# Patient Record
Sex: Female | Born: 1935 | Race: White | Hispanic: No | Marital: Married | Smoking: Former smoker
Health system: Southern US, Community
[De-identification: ages and names within clinical notes are randomized; demographics above are authoritative.]

## PROBLEM LIST (undated history)

## (undated) DIAGNOSIS — M1990 Unspecified osteoarthritis, unspecified site: Secondary | ICD-10-CM

## (undated) DIAGNOSIS — H3589 Other specified retinal disorders: Secondary | ICD-10-CM

## (undated) DIAGNOSIS — M545 Low back pain, unspecified: Secondary | ICD-10-CM

## (undated) DIAGNOSIS — J45909 Unspecified asthma, uncomplicated: Secondary | ICD-10-CM

## (undated) DIAGNOSIS — H5310 Unspecified subjective visual disturbances: Secondary | ICD-10-CM

## (undated) DIAGNOSIS — R0600 Dyspnea, unspecified: Secondary | ICD-10-CM

## (undated) DIAGNOSIS — Z8679 Personal history of other diseases of the circulatory system: Secondary | ICD-10-CM

## (undated) DIAGNOSIS — Z9981 Dependence on supplemental oxygen: Secondary | ICD-10-CM

## (undated) DIAGNOSIS — E785 Hyperlipidemia, unspecified: Secondary | ICD-10-CM

## (undated) DIAGNOSIS — Z9289 Personal history of other medical treatment: Secondary | ICD-10-CM

## (undated) DIAGNOSIS — E559 Vitamin D deficiency, unspecified: Secondary | ICD-10-CM

## (undated) DIAGNOSIS — I4891 Unspecified atrial fibrillation: Secondary | ICD-10-CM

## (undated) DIAGNOSIS — G8929 Other chronic pain: Secondary | ICD-10-CM

## (undated) DIAGNOSIS — M8580 Other specified disorders of bone density and structure, unspecified site: Secondary | ICD-10-CM

## (undated) DIAGNOSIS — R0609 Other forms of dyspnea: Secondary | ICD-10-CM

## (undated) DIAGNOSIS — Z8619 Personal history of other infectious and parasitic diseases: Secondary | ICD-10-CM

## (undated) DIAGNOSIS — Z86718 Personal history of other venous thrombosis and embolism: Secondary | ICD-10-CM

## (undated) DIAGNOSIS — Z923 Personal history of irradiation: Secondary | ICD-10-CM

## (undated) HISTORY — PX: GLAUCOMA SURGERY: SHX656

## (undated) HISTORY — DX: Personal history of irradiation: Z92.3

## (undated) HISTORY — DX: Personal history of other diseases of the circulatory system: Z86.79

## (undated) HISTORY — PX: PERINEOPLASTY: SHX2218

## (undated) HISTORY — DX: Dyspnea, unspecified: R06.00

## (undated) HISTORY — DX: Vitamin D deficiency, unspecified: E55.9

## (undated) HISTORY — PX: TONSILLECTOMY: SUR1361

## (undated) HISTORY — DX: Personal history of other venous thrombosis and embolism: Z86.718

## (undated) HISTORY — PX: APPENDECTOMY: SHX54

## (undated) HISTORY — PX: BREAST BIOPSY: SHX20

## (undated) HISTORY — DX: Other forms of dyspnea: R06.09

## (undated) HISTORY — DX: Unspecified atrial fibrillation: I48.91

## (undated) HISTORY — PX: TOTAL ABDOMINAL HYSTERECTOMY: SHX209

## (undated) HISTORY — DX: Unspecified asthma, uncomplicated: J45.909

## (undated) HISTORY — DX: Other specified retinal disorders: H35.89

## (undated) HISTORY — DX: Personal history of other infectious and parasitic diseases: Z86.19

## (undated) HISTORY — DX: Hyperlipidemia, unspecified: E78.5

## (undated) HISTORY — DX: Other specified disorders of bone density and structure, unspecified site: M85.80

## (undated) HISTORY — DX: Unspecified subjective visual disturbances: H53.10

## (undated) HISTORY — PX: SHOULDER ARTHROSCOPY W/ ROTATOR CUFF REPAIR: SHX2400

---

## 1992-11-26 DIAGNOSIS — Z86718 Personal history of other venous thrombosis and embolism: Secondary | ICD-10-CM

## 1992-11-26 HISTORY — DX: Personal history of other venous thrombosis and embolism: Z86.718

## 2000-08-03 ENCOUNTER — Encounter: Payer: Self-pay | Admitting: Orthopedic Surgery

## 2000-08-03 ENCOUNTER — Encounter: Admission: RE | Admit: 2000-08-03 | Discharge: 2000-08-03 | Payer: Self-pay | Admitting: Orthopedic Surgery

## 2001-07-16 ENCOUNTER — Ambulatory Visit (HOSPITAL_COMMUNITY): Admission: RE | Admit: 2001-07-16 | Discharge: 2001-07-16 | Payer: Self-pay | Admitting: Internal Medicine

## 2001-07-16 LAB — PULMONARY FUNCTION TEST

## 2001-12-20 ENCOUNTER — Other Ambulatory Visit: Admission: RE | Admit: 2001-12-20 | Discharge: 2001-12-20 | Payer: Self-pay | Admitting: *Deleted

## 2004-04-28 ENCOUNTER — Encounter: Admission: RE | Admit: 2004-04-28 | Discharge: 2004-04-28 | Payer: Self-pay | Admitting: Internal Medicine

## 2004-08-28 HISTORY — PX: CARPAL TUNNEL RELEASE: SHX101

## 2007-03-28 ENCOUNTER — Ambulatory Visit: Payer: Self-pay | Admitting: Internal Medicine

## 2007-05-14 ENCOUNTER — Ambulatory Visit: Payer: Self-pay | Admitting: Internal Medicine

## 2007-11-18 ENCOUNTER — Ambulatory Visit: Payer: Self-pay | Admitting: Internal Medicine

## 2008-05-19 ENCOUNTER — Ambulatory Visit: Payer: Self-pay | Admitting: Internal Medicine

## 2008-05-19 DIAGNOSIS — H3589 Other specified retinal disorders: Secondary | ICD-10-CM

## 2008-05-19 DIAGNOSIS — I480 Paroxysmal atrial fibrillation: Secondary | ICD-10-CM

## 2008-05-19 DIAGNOSIS — J45998 Other asthma: Secondary | ICD-10-CM | POA: Insufficient documentation

## 2008-05-19 DIAGNOSIS — J301 Allergic rhinitis due to pollen: Secondary | ICD-10-CM

## 2008-05-19 DIAGNOSIS — E785 Hyperlipidemia, unspecified: Secondary | ICD-10-CM

## 2008-05-19 DIAGNOSIS — Z86718 Personal history of other venous thrombosis and embolism: Secondary | ICD-10-CM | POA: Insufficient documentation

## 2008-05-19 DIAGNOSIS — H5310 Unspecified subjective visual disturbances: Secondary | ICD-10-CM | POA: Insufficient documentation

## 2009-05-19 ENCOUNTER — Ambulatory Visit: Payer: Self-pay | Admitting: Internal Medicine

## 2009-09-28 HISTORY — PX: TRANSTHORACIC ECHOCARDIOGRAM: SHX275

## 2010-04-22 ENCOUNTER — Ambulatory Visit: Payer: Self-pay | Admitting: Internal Medicine

## 2010-05-03 ENCOUNTER — Telehealth (INDEPENDENT_AMBULATORY_CARE_PROVIDER_SITE_OTHER): Payer: Self-pay | Admitting: *Deleted

## 2010-05-23 ENCOUNTER — Telehealth: Payer: Self-pay | Admitting: Internal Medicine

## 2010-09-27 NOTE — Progress Notes (Signed)
Summary: nos appt  Phone Note Call from Patient   Caller: [email or web address]  Call For: Nechelle Petrizzo Summary of Call: Pt's husband stated nos appt from 9/22 wasn' t needed. Initial call taken by: Darletta Moll,  May 23, 2010 10:57 AM

## 2010-09-27 NOTE — Progress Notes (Signed)
Summary: ok for symbicort 160-4.31mcg  Phone Note Call from Patient Call back at Home Phone [phone] Call back at [phone]   Caller: Spouse-Dr. Zachery Dakins Call For: young Reason for Call: Talk to Nurse Summary of Call: Symbicort working well - [facility] Initial call taken by: Eugene Gavia,  May 03, 2010 9:30 AM  Follow-up for Phone Call        called and spoke with pt's husband. husband states pt was seen recently by CY on 8/26/011 and was told to resume taking Symbicort.  Husband states breathing has improved on Symbicort and would like rx called into pharmacy.  Also, husband wanted to know how long pt would need to stay on the 2 puff two times a day directions before cutting down to either 1 puff two times a day or 2 puffs once daily.  Will forward message to CY to address. Arman Filter LPN  May 03, 2010 9:55 AM   Additional Follow-up for Phone Call Additional follow up Details #1::        Per CDY-okay to refill Symbicort 160/4.5 #1 2 puffs and Rinse two times a day with as needed refills; if stable and comfortable can try reducing-minimun would be 1 puff once daily.Reynaldo Minium CMA  May 03, 2010 10:41 AM   called spoke with Dr. Zachery Dakins.  informed of CDYs recs as stated above.  Dr. Zachery Dakins verbalized his understanding.  rx sent to verified pharmacy. Additional Follow-up by: Boone Master CNA/MA,  May 03, 2010 12:15 PM    New/Updated Medications: SYMBICORT 160-4.5 MCG/ACT AERO (BUDESONIDE-FORMOTEROL FUMARATE) 2 puffs and Rinse two times a day Prescriptions: SYMBICORT 160-4.5 MCG/ACT AERO (BUDESONIDE-FORMOTEROL FUMARATE) 2 puffs and Rinse two times a day  #1 x PRN   Entered by:   Boone Master CNA/MA   Authorized by:   Waymon Budge MD   Signed by:   Boone Master CNA/MA on 05/03/2010   Method used:   Electronically to        [facility]. [phone]* (retail)       [address]       [place], [place]  [address]       Ph: [phone]       Fax: [phone]   RxID:   [id number]

## 2010-09-27 NOTE — Assessment & Plan Note (Signed)
Summary: f/u asthma/ ///kp   Primary Provider/Referring Provider:  Perini  CC:  follow up visit-asthma..  History of Present Illness: History of Present Illness: this 77 year old wife of Dr. Valentino Burns, who comes with her today as she follows up for allergic rhinitis and asthma complicated by history of atrial fibrillation and pulmonary embolism.   05/19/08: She says she is "fine", aware of postnasal drip, which is nonseasonal.  Husband says she does some sniffing and throat clearing.  She continues Nasacort HQ one puff each nostril daily.asks about cost saving, alternatives to symbicort.  Medication talk done.  She had PFTs and methacholine study 3 or 4 years ago.  I will get that result for EMR..  05/19/09- Allergic rhinitis, asthma, AF, Hx PE, legally blind...............................Marland Kitchenhusband here She has some persistent nasal drip and we discussed available meds. Discussed using Symbicort 160, used once daily. it isn't clear she needs it. We discussed whether to do another PFT. I reviewed previous PFT with them. Discussed meds.  April 22, 2010- Allergic rhinitis, asthma, AF, Hx PE, legally blind....Marland KitchenMarland KitchenDr Jodi Burns here with her She had been doing pretty well, off Symbicort and using rescue inhaler once a week. In the last week or two has been more easily dyspneic walking a few hundred feet, relieved by brief rests. Pre-use of her inhaler helps. No overt wheeze, cough, chest pain or palpitation.  Office Spirometry- Severe obstructive disease- FEV1 0.76/ 45%; FEV1/FVC 0.55. Walking oximetry- No desaturation on room air walking 185 feet x 3 laps. Walks with husband are longer.      Asthma History    Initial Asthma Severity Rating:    Age range: 12+ years    Symptoms: >2 days/week; not daily    Nighttime Awakenings: 0-2/month    Interferes w/ normal activity: no limitations    SABA use (not for EIB): daily    Asthma Severity Assessment: Moderate Persistent   Preventive  Screening-Counseling & Management  Alcohol-Tobacco     Smoking Status: quit     Year Quit: 1960     Pack years: 17 years   1/2 pack daily     Tobacco Counseling: to remain off tobacco products  Current Medications (verified): 1)  Proair Hfa 108 (90 Base) Mcg/act Aers (Albuterol Sulfate) .... 2 Puffs Four Times A Day As Needed , Rescue 2)  Lanoxin 0.125 Mg  Tabs (Digoxin) .... Take 1 Tablet By Mouth Once A Day 3)  Ocuvite   Tabs (Multiple Vitamins-Minerals) .... Take One Tablet By Mouth Two Times A Day 4)  Zocor 40 Mg Tabs (Simvastatin) .... Take 1 By Mouth Once Daily 5)  Cosopt 2-0.5 %  Soln (Dorzolamide-Timolol) .... Use One Drop in Each Eye Two Times A Day 6)  Fish Oil 1000 Mg  Caps (Omega-3 Fatty Acids) .... Use One Capsule By Mouth Two Times A Day 7)  Cvs Vitamin B-6 100 Mg  Tabs (Pyridoxine Hcl) .... Take 1 Tablet By Mouth Once A Day 8)  Vitamin D (Ergocalciferol) 50000 Unit Caps (Ergocalciferol) .... Take 1 By Mouth Weekly 9)  Astepro 0.15 % Soln (Azelastine Hcl) .Marland Kitchen.. 1-2 Ouffs Each Nostril Up To Twice Daily As Needed  Allergies (verified): 1)  ! Ceclor  Past History:  Past Medical History: Last updated: 05/19/2008 ALLERGIC RHINITIS (ICD-477.9) ASTHMA (ICD-493.90) ATRIAL FIBRILLATION, HX OF (ICD-V12.59) PULMONARY EMBOLISM, HX OF (ICD-V12.51) HYPERLIPIDEMIA (ICD-272.4) VISUAL IMPAIRMENT (ICD-368.10) RETINAL DISORDER (ICD-362.89)  Past Surgical History: Last updated: 05/19/2008 hysterectomy breast biopsy  Family History: Last updated: 06/30/2008 mother was healthy  father died at 54 from sepsis  Social History: Last updated: 06/30/2008 Patient states former smoker.  Husband is retired pediatrician patient is nearly blind drinks 1 cup caffeine a day married 2 children  Risk Factors: Smoking Status: quit (04/22/2010)  Review of Systems      See HPI       The patient complains of shortness of breath with activity.  The patient denies shortness of breath at  rest, productive cough, non-productive cough, coughing up blood, chest pain, irregular heartbeats, acid heartburn, indigestion, loss of appetite, weight change, abdominal pain, difficulty swallowing, sore throat, tooth/dental problems, and headaches.         occasional rhinorhea  Vital Signs:  Patient profile:   77 year old female Height:      62 inches Weight:      131.25 pounds BMI:     24.09 O2 Sat:      96 % on Room air Pulse rate:   92 / minute BP sitting:   118 / 66  (left arm) Cuff size:   regular  Vitals Entered By: Jodi Burns CMA (April 22, 2010 3:20 PM)  O2 Flow:  Room air  Serial Vital Signs/Assessments:  Comments: 4:02 PM Ambulatory Pulse Oximetry  Resting; HR__83___    02 Sat__96%ra___  Lap1 (185 feet)   HR__106___   02 Sat__95%ra___ Lap2 (185 feet)   HR__110___   02 Sat__95%ra___    Lap3 (185 feet)   HR__109___   02 Sat__96%ra___  _x__Test Completed without Difficulty ___Test Stopped due to:   By: Jodi Burns   CC: follow up visit-asthma.   Physical Exam  Additional Exam:  General: A/Ox3; pleasant and cooperative, NAD, Nearly blind SKIN: no rash, lesions NODES: no lymphadenopathy HEENT: [place]/AT, EOM- WNL, Conjuctivae- clear, PERRLA, TM-WNL, Burns- clear, sniffing some, Throat- clear and wnl, Mallampati  II NECK: Supple w/ fair ROM, JVD- none, normal carotid impulses w/o bruits Thyroid-  CHEST: Clear to P&A, unlabored with no wheeze HEART: RRR, no m/g/r heard ABDOMEN: Soft and nl; ZOX:WRUE, nl pulses, no edema  NEURO: Grossly intact to observation      Pulmonary Function Test Date: 04/22/2010 3:49 PM Gender: Female  Pre-Spirometry FVC    Value: 1.39 L/min   % Pred: 60.40 % FEV1    Value: 0.76 L     Pred: 1.70 L     % Pred: 44.70 % FEV1/FVC  Value: 54.67 %     % Pred: 74.30 %  Impression & Recommendations:  Problem # 1:  ASTHMA (ICD-493.90) Dyspnea with exertion in last 1-2 weeks is reflection of air quality rather than a  physiologic change. PFT does show fixed obstruction c/w copd after smoking x 10-15 years, stopping when Jodi Burns died.  Oxygenation isn't limiting her, but obstructive akirways disease is, with FEV1 45% predicted.  I don't think she has had a new VTE event, but will watch for this. We will have her retry Symbicort with a sample.  Problem # 2:  ATRIAL FIBRILLATION, HX OF (ICD-V12.59) Rhythm feels regular or very nearly so today, with controlled ventricular response.  Problem # 3:  PULMONARY EMBOLISM, HX OF (ICD-V12.51) No new event. Consider if we need to go looking for chronic emboic disease or pulmonary hypertension later.  Medications Added to Medication List This Visit: 1)  Zocor 40 Mg Tabs (Simvastatin) .... Take 1 by mouth once daily 2)  Vitamin D (ergocalciferol) 50000 Unit Caps (Ergocalciferol) .... Take 1 by mouth weekly  Other Orders: Est. Patient Level  III [phone])  Patient Instructions: 1)  Walikng oximetry on room air 2)  Spirometry 3)  Continue regular walking as tolerated. 4)  Try resuming Symbicort  160 / 4.5;  2 puffs and rinse mouth, twice daily. OK to use with a spacer. 5)  As you use it up, notice if it has made a difference.  6)  Please schedule a follow-up appointment in 1 year.      [facility] Spirometry  ID: [id number] Patient: Jodi Burns, Jodi Burns DOB: [birth year] Age: 77 Years Old Sex: Female Race: White Physician: Young,Clinton Height: 62 Weight: 131.25 Smoker: No Status: Unconfirmed Past Medical History:  ALLERGIC RHINITIS (ICD-477.9) ASTHMA (ICD-493.90) ATRIAL FIBRILLATION, HX OF (ICD-V12.59) PULMONARY EMBOLISM, HX OF (ICD-V12.51) HYPERLIPIDEMIA (ICD-272.4) VISUAL IMPAIRMENT (ICD-368.10) RETINAL DISORDER (ICD-362.89)   Recorded: 04/22/2010 3:49 PM  Parameter  Measured Predicted %Predicted FVC     1.39        2.30        60.40 FEV1     0.76        1.70        44.70 FEV1%   54.67        73.60        74.30 PEF    1.81        4.37         41.60   Interpretation:

## 2011-01-10 NOTE — Assessment & Plan Note (Signed)
[facility]                             PULMONARY OFFICE NOTE   NILA, WINKER                   MRN:          [id number]  DATE:05/14/2007                            DOB:          Mar 09, 1926    PROBLEM LIST:  1. Mild intermittent asthma.  2. Rhinitis.   HISTORY:  She and her husband report that her breathing has been doing  very much better.  She walks without puffing on a regular basis.  She  started out with Symbicort 160/4.5 two puffs b.i.d. and has dropped it  to 1 puff b.i.d.  She has also reduced her Nasonex.  She cannot tell  that she needs either of them currently.  We scheduled pulmonary  function tests at her first visit, but that was cancelled because of  cataract surgery.   MEDICATIONS:  1. Symbicort.  2. Nasonex.  3. Lanoxin.  4. Vitamin D.  5. Vytorin 10/40.  6. Glaucoma drops.  7. B6.   Drug intolerance to CECLOR with gastrointestinal upset.   OBJECTIVE:  Weight 102 pounds, BP 150/74, pulse 97, room air saturation  98%.  She looks very comfortable.  Voice quality is clear and she laughs  easily.  Nasal airway is clear.  Chest is clear to P&A.  Heart sounds regular without murmur or gallop.  No edema.   Chest x-ray on July 31 reported clear lung fields, normal heart size, no  active lung disease.   IMPRESSION:  1. Mild intermittent asthma.  2. Mild rhinitis.   Problems are currently inactive.  She asks permission to try getting off  of medication.   PLAN:  She may drop off Symbicort 160/4.5 and Nasonex as tolerated.  Consider p.r.n. Claritin.  She will start her inhaled medicines again  should acute need arise.  Will schedule once more for followup in 1 year  to keep chart open for earlier access p.r.n.     Clinton D. Maple Hudson, MD, Tonny Bollman, FACP  Electronically Signed    CDY/MedQ  DD: 05/14/2007  DT: 05/14/2007  Job #: [id number]   cc:   Loraine Leriche A. Perini, M.D.

## 2011-01-10 NOTE — Assessment & Plan Note (Signed)
[facility]                             PULMONARY OFFICE NOTE   TYLAR, MERENDINO                   MRN:          [id number]  DATE:03/28/2007                            DOB:          [birth year]    A 77 year old woman referred through the courtesy of Dr. Waynard Edwards in  pulmonary consultation concerned about reactive airways disease.   HISTORY:  She comes with her husband, Baldo Ash, a retired Optometrist.  Together they describe wheezing noted occasionally over the last year or  two, especially in the winter.  She has not had a prior diagnosis of  respiratory disease that she recalls, and has never thought of herself  as an allergy person.  She tried Flovent originally, but Dr. Waynard Edwards has  helped a lot by giving her Symbicort, which, she says, has made a great  difference.  Her primary trigger seems to have been exercise, mainly  walking, but she also has had exacerbations with what sounds like winter  colds.  She can walk 35-40 minutes without stopping.  She had had a  pulmonary function test at [facility] about 2 years ago, and I note that it  raises some question about her sense that this is not more than a year  or two as a problem for her.  She does not expect sputum, chest pain or  any sudden event.  Today she seems to feel quite well.   MEDICATIONS:  1. Symbicort 160/45 two puffs b.i.d.  2. Nasonex 2 sprays each nostril.  3. Lanoxin 125 mcg.  4. Vitamin D.  5. Vytorin 10/40.  6. Cosopt 1 drop b.i.d.  7. Xalatan 1 drop.  8. Omega-3.  9. Vitamin B6.   Drug intolerance to CECLOR with GI upset.   REVIEW OF SYSTEMS:  Shortness of breath with exertion, occasional mild  acid indigestion, which, she says, does wake her.  Occasional joint  stiffness.  She has some postnasal drainage.  No awareness of any rasp,  aspiration, or choking.  No ankle edema, no exertional chest pain or  palpation.  Occasional right parasternal pain is relieved promptly by  changing positions, suggesting that it is musculoskeletal.   PAST HISTORY:  1. One episode of atrial fibrillation in the past.  2. Asthma.  3. Elevated cholesterol.  4. Pulmonary embolism with Coumadin, remote.  5. Retinal disease with significant visual impairment.  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  6. She is not sure if she had pneumonia.  7. Surgeries for hysterectomy and breast biopsy.   No intolerance to latex, contrast or aspirin.   SOCIAL HISTORY:  She smoked a half a pack a day for 15 or 16 years,  quitting 17 years ago.  Occasional alcohol.  She is a retired Engineer, civil (consulting).   FAMILY HISTORY:  Nobody with respiratory problems.  Father had cancer.   ENVIRONMENTAL:  They have no pets, several large trees around  the home.  They do not recognize respiratory triggers in the home.   OBJECTIVE:  Weight 133 pounds, BP 128/76, pulse 84, room air saturation  97%.  This is a well-developed, well-nourished pleasant lady in no distress.  HEENT:  Oropharynx is clear.  Slight hoarseness to voice quality without  stridor, visible drainage, erythema, neck vein distension or adenopathy.  Heart sounds are regular without murmur.  Her chest is quiet and clear, breathing is unlabored.  EXTREMITIES:  Without cyanosis, clubbing, or edema.   IMPRESSION:  1. Mild, intermittent asthma or intermittent bronchitis.  2. Mild rhinitis.  Symptoms are currently doing quite well on      Symbicort and Nasonex.   PLAN:  We agreed to try to clarify documentation by getting pulmonary  function tests and chest x-ray.  To explore her current medication  needs, she is going to try reducing Symbicort to 1 puff b.i.d. and  Nasonex to 1 puff each nostril.  Pending return in 1 month for followup.  Looking at stability through weather change.  I appreciate the chance to  meet  her.     Clinton D. Maple Hudson, MD, Tonny Bollman, FACP  Electronically Signed    CDY/MedQ  DD: 03/28/2007  DT: 03/29/2007  Job #: [id number]   cc:   Loraine Leriche A. Perini, M.D.

## 2011-04-21 ENCOUNTER — Ambulatory Visit: Payer: Self-pay | Admitting: Internal Medicine

## 2011-04-25 ENCOUNTER — Ambulatory Visit (INDEPENDENT_AMBULATORY_CARE_PROVIDER_SITE_OTHER): Payer: Medicare Other | Admitting: Internal Medicine

## 2011-04-25 ENCOUNTER — Encounter: Payer: Self-pay | Admitting: Internal Medicine

## 2011-04-25 VITALS — BP 122/74 | HR 96 | Ht 62.0 in | Wt 128.2 lb

## 2011-04-25 DIAGNOSIS — J45909 Unspecified asthma, uncomplicated: Secondary | ICD-10-CM

## 2011-04-25 DIAGNOSIS — Z23 Encounter for immunization: Secondary | ICD-10-CM

## 2011-04-25 DIAGNOSIS — J309 Allergic rhinitis, unspecified: Secondary | ICD-10-CM

## 2011-04-25 DIAGNOSIS — H5310 Unspecified subjective visual disturbances: Secondary | ICD-10-CM

## 2011-04-25 NOTE — Assessment & Plan Note (Signed)
She used to smoke. At last PFT there was a significant response to bronchodilator. Not clear how much of her obstructive lung disease is best considered asthma vs COPD

## 2011-04-25 NOTE — Progress Notes (Signed)
Subjective:    Patient ID: Jodi Burns, female    DOB: 08-Aug-1926, 77 y.o.   MRN: [id number]  HPI 04/25/11- 35 yoF former smoker followed for asthma complicated by hx PE, Hx AFib, legally blind( Histo retinitis)     Husband here (Dr. Zachery Dakins) Last here April 22, 2010 She fractured her right tibia falling this Spring, delaying intended cardiac stress test pending with [facility] Bryan Lemma, MD. No significant respiratory issues. She rarely uses her rescue inhaler. We discussed reducing her Symbicort to 1 puff twice daily.  Husband asks about assessing her pulmonary status. Denies cough or phlegm, but admits some nasal drip.  We discussed her eyesight/ glaucoma and avoidance of anticholinergics to dry nasal drip.  Review of Systems Constitutional:   No-   weight loss, night sweats, fevers, chills, fatigue, lassitude. HEENT:   No-  headaches, difficulty swallowing, tooth/dental problems, sore throat,       No-  sneezing, itching, ear ache,    +nasal congestion, post nasal drip,  CV:  No-   chest pain, orthopnea, PND, swelling in lower extremities, anasarca, dizziness, palpitations Resp: No-   shortness of breath with exertion or at rest, but she is not very active.Marland Kitchen              No-   productive cough,  No non-productive cough,  No-  coughing up of blood.              No-   change in color of mucus.  No- wheezing.   Skin: No-   rash or lesions. GI:  No-   heartburn, indigestion, abdominal pain, nausea, vomiting, diarrhea,                 change in bowel habits, loss of appetite GU: No-   dysuria, change in color of urine, no urgency or frequency.  No- flank pain. MS:  No-   joint pain or swelling.  No- decreased range of motion.  No- back pain. Neuro- grossly normal to observation, Or:  Psych:  No- change in mood or affect. No depression or anxiety.  No memory loss.     Objective:   Physical Exam General- Alert, Oriented, Affect-appropriate, Distress- none acute   Slender cheerful Skin- rash-none, lesions- none, excoriation- none Lymphadenopathy- none Head- atraumatic            Eyes- Gross vision -limited to light and dark,  conjunctivae clear secretions            Ears- Hearing, canals normal           Nose- Clear, No-Septal dev, mucus, polyps, erosion, perforation             Throat- Mallampati II , mucosa clear , drainage- none, tonsils- atrophic Neck- flexible , trachea midline, no stridor , thyroid nl, carotid no bruit Chest - symmetrical excursion , unlabored           Heart/CV- RRR , no murmur , no gallop  , no rub, nl s1 s2                           - JVD- none , edema- none, stasis changes- none, varices- none           Lung- clear to P&A, wheeze- none, cough- none , dullness-none, rub- none           Chest wall-  Abd- tender-no, distended-no, bowel sounds-present, HSM- no Br/  Gen/ Rectal- Not done, not indicated Extrem- cyanosis- none, clubbing, none, atrophy- none, strength- nl Neuro- grossly intact to observation         Assessment & Plan:

## 2011-04-25 NOTE — Patient Instructions (Addendum)
  Flu vaccine  Order- schedule PFT- dx asthma  Ok to reduce Symbicort to 1 puff and rinse, twice daily   Consider trying otc anti-inflammatory nasal spray Nasalcrom/ cromolyn

## 2011-04-25 NOTE — Assessment & Plan Note (Addendum)
They found no benefit from Astepro, nasal steroids or antihistamine pills. We will avoid ipratropium because of glaucoma There would be no harm in trying Nasalcrom

## 2011-04-29 ENCOUNTER — Encounter: Payer: Self-pay | Admitting: Internal Medicine

## 2011-04-29 NOTE — Assessment & Plan Note (Signed)
Secondary to histoplasmosis

## 2011-05-12 ENCOUNTER — Ambulatory Visit (INDEPENDENT_AMBULATORY_CARE_PROVIDER_SITE_OTHER): Payer: Medicare Other | Admitting: Internal Medicine

## 2011-05-12 DIAGNOSIS — J45909 Unspecified asthma, uncomplicated: Secondary | ICD-10-CM

## 2011-05-12 LAB — PULMONARY FUNCTION TEST

## 2011-05-12 NOTE — Progress Notes (Signed)
PFT done today. 

## 2011-05-17 ENCOUNTER — Other Ambulatory Visit: Payer: Self-pay | Admitting: Internal Medicine

## 2011-06-20 ENCOUNTER — Encounter: Payer: Self-pay | Admitting: Internal Medicine

## 2011-06-20 ENCOUNTER — Ambulatory Visit (INDEPENDENT_AMBULATORY_CARE_PROVIDER_SITE_OTHER): Payer: Medicare Other | Admitting: Internal Medicine

## 2011-06-20 VITALS — BP 150/88 | HR 93 | Ht 62.0 in | Wt 129.2 lb

## 2011-06-20 DIAGNOSIS — J45909 Unspecified asthma, uncomplicated: Secondary | ICD-10-CM

## 2011-06-20 DIAGNOSIS — J309 Allergic rhinitis, unspecified: Secondary | ICD-10-CM

## 2011-06-20 NOTE — Patient Instructions (Signed)
Ok to try reducing Symbicort to 1 puff twice daily. You can go back up to 2 puffs twice daily if needed.  The walking is good- keep it up.

## 2011-06-20 NOTE — Progress Notes (Signed)
Patient ID: Jodi Burns, female    DOB: 17-Apr-1926, 77 y.o.   MRN: [id number]  HPI 04/25/11- 36 yoF former smoker followed for asthma complicated by hx PE, Hx AFib, legally blind( Histo retinitis)     Husband here (Dr. Zachery Dakins) Last here April 22, 2010 She fractured her right tibia falling this Spring, delaying intended cardiac stress test pending with [facility] Bryan Lemma, MD. No significant respiratory issues. She rarely uses her rescue inhaler. We discussed reducing her Symbicort to 1 puff twice daily.  Husband asks about assessing her pulmonary status. Denies cough or phlegm, but admits some nasal drip.  We discussed her eyesight/ glaucoma and avoidance of anticholinergics to dry nasal drip.   06/20/11- 3 yoF former smoker followed for asthma complicated by hx PE, Hx AFib, legally blind( Histo retinitis)     Husband here (Dr. Zachery Dakins)  Occasional clear nasal discharge never tried Nasalcrom and says the amount of drainage is not bothering her. She feels "fine", better than in a long time. Denies wheeze or shortness of breath. Continues Symbicort. Goes for walks with her husband.   Review of Systems Constitutional:   No-   weight loss, night sweats, fevers, chills, fatigue, lassitude. HEENT:   No-  headaches, difficulty swallowing, tooth/dental problems, sore throat,       No-  sneezing, itching, ear ache,    +nasal congestion, post nasal drip,  CV:  No-   chest pain, orthopnea, PND, swelling in lower extremities, anasarca, dizziness, palpitations Resp: No-   shortness of breath with exertion or at rest, but she is not very active.Marland Kitchen              No-   productive cough,  No non-productive cough,  No-  coughing up of blood.              No-   change in color of mucus.  No- wheezing.   Skin: No-   rash or lesions. GI:  No-   heartburn, indigestion, abdominal pain, nausea, vomiting, diarrhea,                 change in bowel habits, loss of appetite GU: No-    dysuria, change in color of urine, no urgency or frequency.  No- flank pain. MS:  No-   joint pain or swelling.  No- decreased range of motion.  No- back pain. Neuro- grossly normal to observation, Or:  Psych:  No- change in mood or affect. No depression or anxiety.  No memory loss.     Objective:   Physical Exam General- Alert, Oriented, Affect-appropriate, Distress- none acute  Slender cheerful Skin- rash-none, lesions- none, excoriation- none Lymphadenopathy- none Head- atraumatic            Eyes- Gross vision -limited to light and dark,  conjunctivae clear secretions            Ears- Hearing, canals normal           Nose- Clear, No-Septal dev, mucus, polyps, erosion, perforation             Throat- Mallampati II , mucosa clear , drainage- none, tonsils- atrophic Neck- flexible , trachea midline, no stridor , thyroid nl, carotid no bruit Chest - symmetrical excursion , unlabored           Heart/CV- RRR , no murmur , no gallop  , no rub, nl s1 s2                           -  JVD- none , edema- none, stasis changes- none, varices- none           Lung- clear to P&A, wheeze- none, cough- none , dullness-none, rub- none           Chest wall-  Abd- tender-no, distended-no, bowel sounds-present, HSM- no Br/ Gen/ Rectal- Not done, not indicated Extrem- cyanosis- none, clubbing, none, atrophy- none, strength- nl Neuro- grossly intact to observation

## 2011-06-23 NOTE — Assessment & Plan Note (Signed)
Minimal residual postnasal drip. Nasalcrom was an option we discussed that she never tried. She would go first to antihistamine.

## 2011-06-23 NOTE — Assessment & Plan Note (Signed)
Very good control. They see no reason to make changes. Dr. Waynard Edwards has checked chest x-ray.

## 2011-09-13 DIAGNOSIS — Z1231 Encounter for screening mammogram for malignant neoplasm of breast: Secondary | ICD-10-CM | POA: Diagnosis not present

## 2011-11-15 DIAGNOSIS — M949 Disorder of cartilage, unspecified: Secondary | ICD-10-CM | POA: Diagnosis not present

## 2011-11-15 DIAGNOSIS — E559 Vitamin D deficiency, unspecified: Secondary | ICD-10-CM | POA: Diagnosis not present

## 2011-11-15 DIAGNOSIS — E785 Hyperlipidemia, unspecified: Secondary | ICD-10-CM | POA: Diagnosis not present

## 2011-11-15 DIAGNOSIS — E538 Deficiency of other specified B group vitamins: Secondary | ICD-10-CM | POA: Diagnosis not present

## 2011-11-15 DIAGNOSIS — D518 Other vitamin B12 deficiency anemias: Secondary | ICD-10-CM | POA: Diagnosis not present

## 2011-11-15 DIAGNOSIS — R82998 Other abnormal findings in urine: Secondary | ICD-10-CM | POA: Diagnosis not present

## 2011-11-17 DIAGNOSIS — E785 Hyperlipidemia, unspecified: Secondary | ICD-10-CM | POA: Diagnosis not present

## 2011-11-22 DIAGNOSIS — R0609 Other forms of dyspnea: Secondary | ICD-10-CM | POA: Diagnosis not present

## 2011-11-22 DIAGNOSIS — R0989 Other specified symptoms and signs involving the circulatory and respiratory systems: Secondary | ICD-10-CM | POA: Diagnosis not present

## 2011-11-22 DIAGNOSIS — E785 Hyperlipidemia, unspecified: Secondary | ICD-10-CM | POA: Diagnosis not present

## 2011-11-22 DIAGNOSIS — Z0000 Encounter for general adult medical examination without abnormal findings: Secondary | ICD-10-CM | POA: Diagnosis not present

## 2011-11-22 DIAGNOSIS — I4891 Unspecified atrial fibrillation: Secondary | ICD-10-CM | POA: Diagnosis not present

## 2011-12-05 DIAGNOSIS — E559 Vitamin D deficiency, unspecified: Secondary | ICD-10-CM | POA: Diagnosis not present

## 2011-12-05 DIAGNOSIS — M949 Disorder of cartilage, unspecified: Secondary | ICD-10-CM | POA: Diagnosis not present

## 2011-12-05 DIAGNOSIS — M899 Disorder of bone, unspecified: Secondary | ICD-10-CM | POA: Diagnosis not present

## 2011-12-06 DIAGNOSIS — Z124 Encounter for screening for malignant neoplasm of cervix: Secondary | ICD-10-CM | POA: Diagnosis not present

## 2011-12-06 DIAGNOSIS — Z01419 Encounter for gynecological examination (general) (routine) without abnormal findings: Secondary | ICD-10-CM | POA: Diagnosis not present

## 2011-12-19 ENCOUNTER — Ambulatory Visit (INDEPENDENT_AMBULATORY_CARE_PROVIDER_SITE_OTHER): Payer: Medicare Other | Admitting: Internal Medicine

## 2011-12-19 ENCOUNTER — Encounter: Payer: Self-pay | Admitting: Internal Medicine

## 2011-12-19 VITALS — BP 118/78 | HR 84 | Ht 62.0 in | Wt 132.6 lb

## 2011-12-19 DIAGNOSIS — Z86718 Personal history of other venous thrombosis and embolism: Secondary | ICD-10-CM

## 2011-12-19 DIAGNOSIS — J301 Allergic rhinitis due to pollen: Secondary | ICD-10-CM | POA: Diagnosis not present

## 2011-12-19 DIAGNOSIS — J45909 Unspecified asthma, uncomplicated: Secondary | ICD-10-CM | POA: Diagnosis not present

## 2011-12-19 DIAGNOSIS — R82998 Other abnormal findings in urine: Secondary | ICD-10-CM | POA: Diagnosis not present

## 2011-12-19 DIAGNOSIS — N390 Urinary tract infection, site not specified: Secondary | ICD-10-CM | POA: Diagnosis not present

## 2011-12-19 MED ORDER — ALBUTEROL SULFATE HFA 108 (90 BASE) MCG/ACT IN AERS: 2.0000 | INHALATION_SPRAY | Freq: Four times a day (QID) | RESPIRATORY_TRACT | Status: DC | PRN

## 2011-12-19 MED ORDER — BUDESONIDE-FORMOTEROL FUMARATE 160-4.5 MCG/ACT IN AERO: 2.0000 | INHALATION_SPRAY | Freq: Two times a day (BID) | RESPIRATORY_TRACT | Status: DC

## 2011-12-19 NOTE — Progress Notes (Signed)
Patient ID: Jodi Burns, female    DOB: 05/12/1926, 78 y.o.   MRN: [id number]  HPI 04/25/11- 78 yoF former smoker followed for asthma complicated by hx PE, Hx AFib, legally blind( Histo retinitis)     Husband here (Dr. Zachery Dakins) Last here April 22, 2010 She fractured her right tibia falling this Spring, delaying intended cardiac stress test pending with [facility] Bryan Lemma, MD. No significant respiratory issues. She rarely uses her rescue inhaler. We discussed reducing her Symbicort to 1 puff twice daily.  Husband asks about assessing her pulmonary status. Denies cough or phlegm, but admits some nasal drip.  We discussed her eyesight/ glaucoma and avoidance of anticholinergics to dry nasal drip.   06/20/11- 78 yoF former smoker followed for asthma complicated by hx PE, Hx AFib, legally blind( Histo retinitis)     Husband here (Dr. Zachery Dakins)  Occasional clear nasal discharge never tried Nasalcrom and says the amount of drainage is not bothering her. She feels "fine", better than in a long time. Denies wheeze or shortness of breath. Continues Symbicort. Goes for walks with her husband.  12/19/11- 78 yoF former smoker followed for asthma complicated by hx PE, Hx AFib, legally blind( Histo retinitis)     Husband here (Dr. Zachery Dakins)  Well-controlled rhinitis and asthma. Using rescue inhaler not more than once a week. Astepro nasal spray was no help. Symbicort has used twice daily. She walks regularly and notices a little more nasal congestion now in spring pollen season.  Review of Systems- see HPI Constitutional:   No-   weight loss, night sweats, fevers, chills, fatigue, lassitude. HEENT:   No-  headaches, difficulty swallowing, tooth/dental problems, sore throat,       No-  sneezing, itching, ear ache,    +nasal congestion, post nasal drip,  CV:  No-   chest pain, orthopnea, PND, swelling in lower extremities, anasarca, dizziness, palpitations Resp: No-   shortness of  breath with exertion or at rest, but she is not very active.Marland Kitchen              No-   productive cough,  No non-productive cough,  No-  coughing up of blood.              No-   change in color of mucus.  No- wheezing.   Skin: No-   rash or lesions. GI:  No-   heartburn, indigestion, abdominal pain, nausea, vomiting,  GU: No-   dysuria,  MS:  No-   joint pain or swelling.   Neuro- nothing unusual Psych:  No- change in mood or affect. No depression or anxiety.  No memory loss.     Objective:   Physical Exam General- Alert, Oriented, Affect-appropriate, Distress- none acute  Slender cheerful Skin- rash-none, lesions- none, excoriation- none Lymphadenopathy- none Head- atraumatic            Eyes- Gross vision -limited to light and dark,  conjunctivae clear secretions            Ears- Hearing, canals normal           Nose- Clear, No-Septal dev, mucus, polyps, erosion, perforation             Throat- Mallampati II , mucosa clear , drainage- none, tonsils- atrophic Neck- flexible , trachea midline, no stridor , thyroid nl, carotid no bruit Chest - symmetrical excursion , unlabored           Heart/CV- RRR , no murmur ,  no gallop  , no rub, nl s1 s2                           - JVD- none , edema- none, stasis changes- none, varices- none           Lung- clear to P&A, wheeze- none, cough- none , dullness-none, rub- none           Chest wall-  Abd-  Br/ Gen/ Rectal- Not done, not indicated Extrem- cyanosis- none, clubbing, none, atrophy- none, strength- nl Neuro- grossly intact to observation

## 2011-12-19 NOTE — Patient Instructions (Signed)
Scripts refilled. The drugstore can put them on hold until needed.

## 2011-12-22 NOTE — Assessment & Plan Note (Signed)
Chronic fixed asthma. She can no longer tell if Symbicort helps. I gave information to wean off of it for observation. We did refill her rescue inhaler.

## 2011-12-22 NOTE — Assessment & Plan Note (Signed)
Good control. Minor breakthrough symptoms for which she does not bother management.

## 2011-12-22 NOTE — Assessment & Plan Note (Signed)
Remote history with no recurrence

## 2012-02-06 DIAGNOSIS — H4011X0 Primary open-angle glaucoma, stage unspecified: Secondary | ICD-10-CM | POA: Diagnosis not present

## 2012-02-06 DIAGNOSIS — Z961 Presence of intraocular lens: Secondary | ICD-10-CM | POA: Diagnosis not present

## 2012-02-06 DIAGNOSIS — H409 Unspecified glaucoma: Secondary | ICD-10-CM | POA: Diagnosis not present

## 2012-03-11 ENCOUNTER — Telehealth: Payer: Self-pay | Admitting: Internal Medicine

## 2012-03-11 MED ORDER — ALBUTEROL SULFATE HFA 108 (90 BASE) MCG/ACT IN AERS: 2.0000 | INHALATION_SPRAY | Freq: Four times a day (QID) | RESPIRATORY_TRACT | Status: DC | PRN

## 2012-03-11 NOTE — Telephone Encounter (Signed)
Proair was refilled- LMOM for spouse to be made aware.

## 2012-03-15 DIAGNOSIS — L578 Other skin changes due to chronic exposure to nonionizing radiation: Secondary | ICD-10-CM | POA: Diagnosis not present

## 2012-03-15 DIAGNOSIS — L570 Actinic keratosis: Secondary | ICD-10-CM | POA: Diagnosis not present

## 2012-03-15 DIAGNOSIS — L820 Inflamed seborrheic keratosis: Secondary | ICD-10-CM | POA: Diagnosis not present

## 2012-03-28 HISTORY — PX: CARDIOVASCULAR STRESS TEST: SHX262

## 2012-03-28 HISTORY — PX: OTHER SURGICAL HISTORY: SHX169

## 2012-04-10 DIAGNOSIS — R5381 Other malaise: Secondary | ICD-10-CM | POA: Diagnosis not present

## 2012-04-10 DIAGNOSIS — I10 Essential (primary) hypertension: Secondary | ICD-10-CM | POA: Diagnosis not present

## 2012-04-10 DIAGNOSIS — R0602 Shortness of breath: Secondary | ICD-10-CM | POA: Diagnosis not present

## 2012-04-22 DIAGNOSIS — R0602 Shortness of breath: Secondary | ICD-10-CM | POA: Diagnosis not present

## 2012-05-07 DIAGNOSIS — R5383 Other fatigue: Secondary | ICD-10-CM | POA: Diagnosis not present

## 2012-05-07 DIAGNOSIS — E782 Mixed hyperlipidemia: Secondary | ICD-10-CM | POA: Diagnosis not present

## 2012-05-21 DIAGNOSIS — L578 Other skin changes due to chronic exposure to nonionizing radiation: Secondary | ICD-10-CM | POA: Diagnosis not present

## 2012-05-21 DIAGNOSIS — L821 Other seborrheic keratosis: Secondary | ICD-10-CM | POA: Diagnosis not present

## 2012-05-21 DIAGNOSIS — D485 Neoplasm of uncertain behavior of skin: Secondary | ICD-10-CM | POA: Diagnosis not present

## 2012-05-24 DIAGNOSIS — R0989 Other specified symptoms and signs involving the circulatory and respiratory systems: Secondary | ICD-10-CM | POA: Diagnosis not present

## 2012-05-24 DIAGNOSIS — D518 Other vitamin B12 deficiency anemias: Secondary | ICD-10-CM | POA: Diagnosis not present

## 2012-05-24 DIAGNOSIS — J45909 Unspecified asthma, uncomplicated: Secondary | ICD-10-CM | POA: Diagnosis not present

## 2012-05-24 DIAGNOSIS — Z23 Encounter for immunization: Secondary | ICD-10-CM | POA: Diagnosis not present

## 2012-06-04 DIAGNOSIS — H6120 Impacted cerumen, unspecified ear: Secondary | ICD-10-CM | POA: Diagnosis not present

## 2012-06-27 DIAGNOSIS — M23302 Other meniscus derangements, unspecified lateral meniscus, unspecified knee: Secondary | ICD-10-CM | POA: Diagnosis not present

## 2012-07-31 DIAGNOSIS — M1710 Unilateral primary osteoarthritis, unspecified knee: Secondary | ICD-10-CM | POA: Diagnosis not present

## 2012-08-09 DIAGNOSIS — H30119 Disseminated chorioretinal inflammation of posterior pole, unspecified eye: Secondary | ICD-10-CM | POA: Diagnosis not present

## 2012-11-18 DIAGNOSIS — L723 Sebaceous cyst: Secondary | ICD-10-CM | POA: Diagnosis not present

## 2012-11-18 DIAGNOSIS — L738 Other specified follicular disorders: Secondary | ICD-10-CM | POA: Diagnosis not present

## 2012-11-18 DIAGNOSIS — L821 Other seborrheic keratosis: Secondary | ICD-10-CM | POA: Diagnosis not present

## 2012-11-18 DIAGNOSIS — L820 Inflamed seborrheic keratosis: Secondary | ICD-10-CM | POA: Diagnosis not present

## 2012-11-19 DIAGNOSIS — M79609 Pain in unspecified limb: Secondary | ICD-10-CM | POA: Diagnosis not present

## 2012-11-20 DIAGNOSIS — E559 Vitamin D deficiency, unspecified: Secondary | ICD-10-CM | POA: Diagnosis not present

## 2012-11-20 DIAGNOSIS — M949 Disorder of cartilage, unspecified: Secondary | ICD-10-CM | POA: Diagnosis not present

## 2012-11-20 DIAGNOSIS — D518 Other vitamin B12 deficiency anemias: Secondary | ICD-10-CM | POA: Diagnosis not present

## 2012-11-20 DIAGNOSIS — E785 Hyperlipidemia, unspecified: Secondary | ICD-10-CM | POA: Diagnosis not present

## 2012-11-20 DIAGNOSIS — M899 Disorder of bone, unspecified: Secondary | ICD-10-CM | POA: Diagnosis not present

## 2012-11-27 DIAGNOSIS — R413 Other amnesia: Secondary | ICD-10-CM | POA: Diagnosis not present

## 2012-11-27 DIAGNOSIS — Z79899 Other long term (current) drug therapy: Secondary | ICD-10-CM | POA: Diagnosis not present

## 2012-11-27 DIAGNOSIS — Z1331 Encounter for screening for depression: Secondary | ICD-10-CM | POA: Diagnosis not present

## 2012-11-27 DIAGNOSIS — I4891 Unspecified atrial fibrillation: Secondary | ICD-10-CM | POA: Diagnosis not present

## 2012-11-27 DIAGNOSIS — R0989 Other specified symptoms and signs involving the circulatory and respiratory systems: Secondary | ICD-10-CM | POA: Diagnosis not present

## 2012-11-27 DIAGNOSIS — E785 Hyperlipidemia, unspecified: Secondary | ICD-10-CM | POA: Diagnosis not present

## 2012-11-27 DIAGNOSIS — Z0000 Encounter for general adult medical examination without abnormal findings: Secondary | ICD-10-CM | POA: Diagnosis not present

## 2012-11-27 DIAGNOSIS — J309 Allergic rhinitis, unspecified: Secondary | ICD-10-CM | POA: Diagnosis not present

## 2012-11-27 DIAGNOSIS — D518 Other vitamin B12 deficiency anemias: Secondary | ICD-10-CM | POA: Diagnosis not present

## 2012-11-27 DIAGNOSIS — J45909 Unspecified asthma, uncomplicated: Secondary | ICD-10-CM | POA: Diagnosis not present

## 2012-11-27 DIAGNOSIS — F329 Major depressive disorder, single episode, unspecified: Secondary | ICD-10-CM | POA: Diagnosis not present

## 2012-12-02 DIAGNOSIS — Z1212 Encounter for screening for malignant neoplasm of rectum: Secondary | ICD-10-CM | POA: Diagnosis not present

## 2012-12-26 ENCOUNTER — Ambulatory Visit (INDEPENDENT_AMBULATORY_CARE_PROVIDER_SITE_OTHER): Payer: Medicare Other | Admitting: Obstetrics & Gynecology

## 2012-12-26 ENCOUNTER — Encounter: Payer: Self-pay | Admitting: Obstetrics & Gynecology

## 2012-12-26 VITALS — BP 148/78 | Ht 62.75 in | Wt 132.6 lb

## 2012-12-26 DIAGNOSIS — Z124 Encounter for screening for malignant neoplasm of cervix: Secondary | ICD-10-CM | POA: Diagnosis not present

## 2012-12-26 DIAGNOSIS — Z01419 Encounter for gynecological examination (general) (routine) without abnormal findings: Secondary | ICD-10-CM | POA: Diagnosis not present

## 2012-12-26 NOTE — Patient Instructions (Signed)

## 2012-12-26 NOTE — Progress Notes (Addendum)
79 y.o. G2P2 MarriedCaucasianF here for annual exam.  Reports having more issues with her eyes.  Saw Dr. Waynard Edwards within last year.  Has blood work done with him.  Patient's last menstrual period was 08/29/1967.          Sexually active: no  The current method of family planning is status post hysterectomy.    Exercising: yes  moderately Smoker:  no  Health Maintenance: Pap:  12/20/01 WNL MMG:  09/13/2011 normal ? 2014 (mmg obtained 12/27/12--no 2014 mmg) Colonoscopy:  2012 BMD:   12/05/11 Dr Waynard Edwards TDaP:  2006 Dr Waynard Edwards Labs: PCP   reports that she has quit smoking. She has never used smokeless tobacco. She reports that  drinks alcohol. She reports that she does not use illicit drugs.  Past Medical History  Diagnosis Date  . Allergic rhinitis   . Unspecified asthma   . Personal history of other diseases of circulatory system   . Personal history of venous thrombosis and embolism   . Other and unspecified hyperlipidemia   . Subjective visual disturbance, unspecified   . Other retinal disorders   . Atrial fibrillation   . Osteopenia     Past Surgical History  Procedure Laterality Date  . Vesicovaginal fistula closure w/ tah    . Breast biopsy    . Appendectomy    . Total abdominal hysterectomy    . Breast biopsy      bilateral  . Rotator cuff repair  7/04, 2003    right, left  . Carpal tunnel release  2006    right  . Perineoplasty      and rectocele    Current Outpatient Prescriptions  Medication Sig Dispense Refill  . albuterol (PROAIR HFA) 108 (90 BASE) MCG/ACT inhaler Inhale 2 puffs into the lungs every 6 (six) hours as needed for wheezing or shortness of breath.  1 Inhaler  5  . Azelastine HCl (ASTEPRO) 0.15 % SOLN Place 1-2 sprays into the nose 2 (two) times daily as needed.        . Coenzyme Q10 (COQ10) 100 MG CAPS Take 1 capsule by mouth 2 (two) times daily.        . digoxin (LANOXIN) 0.125 MG tablet Take 125 mcg by mouth daily.        . dorzolamide-timolol  (COSOPT) 22.3-6.8 MG/ML ophthalmic solution Place 1 drop into both eyes 2 (two) times daily.        . ergocalciferol (VITAMIN D2) 50000 UNITS capsule Take 50,000 Units by mouth once a week.        . fish oil-omega-3 fatty acids 1000 MG capsule Take 1 g by mouth 2 (two) times daily.        . Multiple Vitamins-Minerals (PRESERVISION/LUTEIN) CAPS Take 1 capsule by mouth 2 (two) times daily.        . simvastatin (ZOCOR) 40 MG tablet Take 40 mg by mouth daily.        . vitamin B-12 (CYANOCOBALAMIN) 100 MCG tablet Take 100 mcg by mouth daily.        . budesonide-formoterol (SYMBICORT) 160-4.5 MCG/ACT inhaler Inhale 2 puffs into the lungs 2 (two) times daily. Rinse mouth  10.2 g  PRN   No current facility-administered medications for this visit.    Family History  Problem Relation Age of Onset  . Diabetes Paternal Aunt   . Osteoporosis Mother     ROS:  Pertinent items are noted in HPI.  Otherwise, a comprehensive ROS was negative.  Exam:  Ht 5' 2.75" (1.594 m)  Wt 132 lb 9.6 oz (60.147 kg)  BMI 23.67 kg/m2  LMP 08/29/1967   Height: 5' 2.75" (159.4 cm)  Ht Readings from Last 3 Encounters:  12/26/12 5' 2.75" (1.594 m)  12/19/11 5\' 2"  (1.575 m)  06/20/11 5\' 2"  (1.575 m)    General appearance: alert, cooperative and appears stated age Head: Normocephalic, without obvious abnormality, atraumatic Neck: no adenopathy, supple, symmetrical, trachea midline and thyroid normal to inspection and palpation Lungs: clear to auscultation bilaterally Breasts: normal appearance, no masses or tenderness Heart: regular rate and rhythm Abdomen: soft, non-tender; bowel sounds normal; no masses,  no organomegaly Extremities: extremities normal, atraumatic, no cyanosis or edema Skin: Skin color, texture, turgor normal. No rashes or lesions Lymph nodes: Cervical, supraclavicular, and axillary nodes normal. No abnormal inguinal nodes palpated Neurologic: Grossly normal   Pelvic: External genitalia:  no  lesions              Urethra:  normal appearing urethra with no masses, tenderness or lesions              Bartholins and Skenes: normal                 Vagina: normal appearing vagina with normal color and discharge, no lesions, atrophic              Cervix: no lesions              Pap taken: no Bimanual Exam:  Uterus:  normal size, contour, position, consistency, mobility, non-tender              Adnexa: no mass, fullness, tenderness               Rectovaginal: Confirms               Anus:  normal sphincter tone, no lesions  A:  Well Woman with normal exam H/O TAH, ovaries remain Osteopenia Asthma/reactive airway disease 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  Will get most recent copy.  Pt thinks done 2013. Sees Dr. Waynard Edwards yearly and has lab work done with him See Dr. Maple Hudson, pulmonology, every six months. return annually or prn  An After Visit Summary was printed and given to the patient.  Addendum--no mmg for 2014.  Pt will be notified.

## 2013-01-02 ENCOUNTER — Ambulatory Visit (INDEPENDENT_AMBULATORY_CARE_PROVIDER_SITE_OTHER): Payer: Medicare Other | Admitting: Internal Medicine

## 2013-01-02 ENCOUNTER — Encounter: Payer: Self-pay | Admitting: Internal Medicine

## 2013-01-02 VITALS — BP 140/60 | HR 107 | Ht 64.0 in | Wt 134.4 lb

## 2013-01-02 DIAGNOSIS — J45909 Unspecified asthma, uncomplicated: Secondary | ICD-10-CM

## 2013-01-02 DIAGNOSIS — J301 Allergic rhinitis due to pollen: Secondary | ICD-10-CM | POA: Diagnosis not present

## 2013-01-02 DIAGNOSIS — J45998 Other asthma: Secondary | ICD-10-CM

## 2013-01-02 NOTE — Progress Notes (Signed)
Patient ID: Jodi Burns, female    DOB: September 16, 1925, 79 y.o.   MRN: [id number]  HPI 04/25/11- 57 yoF former smoker followed for asthma complicated by hx PE, Hx AFib, legally blind( Histo retinitis)     Husband here (Dr. Zachery Dakins) Last here April 22, 2010 She fractured her right tibia falling this Spring, delaying intended cardiac stress test pending with [facility] Bryan Lemma, MD. No significant respiratory issues. She rarely uses her rescue inhaler. We discussed reducing her Symbicort to 1 puff twice daily.  Husband asks about assessing her pulmonary status. Denies cough or phlegm, but admits some nasal drip.  We discussed her eyesight/ glaucoma and avoidance of anticholinergics to dry nasal drip.   06/20/11- 82 yoF former smoker followed for asthma complicated by hx PE, Hx AFib, legally blind( Histo retinitis)     Husband here (Dr. Zachery Dakins)  Occasional clear nasal discharge never tried Nasalcrom and says the amount of drainage is not bothering her. She feels "fine", better than in a long time. Denies wheeze or shortness of breath. Continues Symbicort. Goes for walks with her husband.  12/19/11- 21 yoF former smoker followed for asthma complicated by hx PE, Hx AFib, legally blind( Histo retinitis)     Husband here (Dr. Zachery Dakins)  Well-controlled rhinitis and asthma. Using rescue inhaler not more than once a week. Astepro nasal spray was no help. Symbicort has used twice daily. She walks regularly and notices a little more nasal congestion now in spring pollen season.  01/02/13- 54 yoF former smoker followed for asthma complicated by hx PE, Hx AFib, legally blind( Histo retinitis)     Husband here (Dr. Zachery Dakins) FOLLOWS QMV:HQIONG any SOB, wheezing, cough, or congestion. Pt is having nasal drainage from pollen increased. Mainly notices dyspnea on exertion but has been doing very well with walks in the park. Her cardiologist attributes dyspnea more to lungs than to  heart. Husband mentions a persistent clear nasal drip but she is not concerned by it. Medications reviewed.  Review of Systems- see HPI Constitutional:   No-   weight loss, night sweats, fevers, chills, fatigue, lassitude. HEENT:   No-  headaches, difficulty swallowing, tooth/dental problems, sore throat,       No-  sneezing, itching, ear ache,    +nasal congestion, +post nasal drip,  CV:  No-   chest pain, orthopnea, PND, swelling in lower extremities, anasarca, dizziness, palpitations Resp: + shortness of breath with exertion , but she is not very active.Marland Kitchen              No-   productive cough,  No non-productive cough,  No-  coughing up of blood.              No-   change in color of mucus.  No- wheezing.   Skin: No-   rash or lesions. GI:  No-   heartburn, indigestion, abdominal pain, nausea, vomiting,  GU: No-   dysuria,  MS:  No-   joint pain or swelling.   Neuro- nothing unusual Psych:  No- change in mood or affect. No depression or anxiety.  No memory loss.     Objective:   Physical Exam General- Alert, Oriented, Affect-appropriate, Distress- none acute  Slender cheerful Skin- rash-none, lesions- none, excoriation- none Lymphadenopathy- none Head- atraumatic            Eyes- Gross vision -limited to light and dark,  conjunctivae clear secretions  Ears- Hearing, canals normal                          Nose- Clear, No-Septal dev, mucus, polyps, erosion, perforation             Throat- Mallampati II , mucosa clear , drainage- none, tonsils- atrophic Neck- flexible , trachea midline, no stridor , thyroid nl, carotid no bruit Chest - symmetrical excursion , unlabored           Heart/CV- RRR , no murmur , no gallop  , no rub, nl s1 s2                           - JVD- none , edema- none, stasis changes- none, varices- none           Lung- clear to P&A, wheeze- none, cough- none , dullness-none, rub- none           Chest wall-  Abd-  Br/ Gen/ Rectal- Not done, not  indicated Extrem- cyanosis- none, clubbing, none, atrophy- none, strength- nl Neuro- grossly intact to observation

## 2013-01-02 NOTE — Patient Instructions (Addendum)
Ok to try reducing Symbicort to 1 puff, then rinse mouth, twice daily  Ok to reduce the Flonase/ fluticasone gradually, if you find you can get away with it.

## 2013-01-12 NOTE — Assessment & Plan Note (Signed)
Watery rhinorrhea-mild nonspecific rhinitis. At least partly allergic. She can choose to take an antihistamine if she cares enough, but right now it is bothering her

## 2013-01-12 NOTE — Assessment & Plan Note (Signed)
Former smoker with significant obstructive airways disease. Clinically this acts like an emphysema pattern without much reversible component demonstrated as long as she takes her bronchodilators

## 2013-02-05 DIAGNOSIS — IMO0002 Reserved for concepts with insufficient information to code with codable children: Secondary | ICD-10-CM | POA: Diagnosis not present

## 2013-02-05 DIAGNOSIS — F329 Major depressive disorder, single episode, unspecified: Secondary | ICD-10-CM | POA: Diagnosis not present

## 2013-02-05 DIAGNOSIS — Z23 Encounter for immunization: Secondary | ICD-10-CM | POA: Diagnosis not present

## 2013-02-05 DIAGNOSIS — R413 Other amnesia: Secondary | ICD-10-CM | POA: Diagnosis not present

## 2013-02-05 DIAGNOSIS — J309 Allergic rhinitis, unspecified: Secondary | ICD-10-CM | POA: Diagnosis not present

## 2013-02-05 DIAGNOSIS — J45909 Unspecified asthma, uncomplicated: Secondary | ICD-10-CM | POA: Diagnosis not present

## 2013-02-10 DIAGNOSIS — R413 Other amnesia: Secondary | ICD-10-CM | POA: Diagnosis not present

## 2013-02-18 DIAGNOSIS — H30119 Disseminated chorioretinal inflammation of posterior pole, unspecified eye: Secondary | ICD-10-CM | POA: Diagnosis not present

## 2013-04-29 ENCOUNTER — Telehealth: Payer: Self-pay | Admitting: Obstetrics & Gynecology

## 2013-04-29 NOTE — Telephone Encounter (Signed)
Per Jodi Burns, last MMG was 09-23-11.

## 2013-04-29 NOTE — Telephone Encounter (Signed)
Patient's husband, Dr Zachery Dakins, notified last MMG 09-23-11 and yearly MMG is recommended. Phone number to [facility] given, he will call them.  Declined for me to schedule for them, states they dont mind calling, just wanted to double check if it was needed.

## 2013-04-29 NOTE — Telephone Encounter (Signed)
Needs to know when wife, Jodi Burns, is due for her next mammogram.  He received a reminder but cannot locate it.

## 2013-05-01 DIAGNOSIS — S62639A Displaced fracture of distal phalanx of unspecified finger, initial encounter for closed fracture: Secondary | ICD-10-CM | POA: Diagnosis not present

## 2013-05-01 DIAGNOSIS — M79609 Pain in unspecified limb: Secondary | ICD-10-CM | POA: Diagnosis not present

## 2013-05-08 DIAGNOSIS — Z1231 Encounter for screening mammogram for malignant neoplasm of breast: Secondary | ICD-10-CM | POA: Diagnosis not present

## 2013-07-11 ENCOUNTER — Other Ambulatory Visit: Payer: Self-pay | Admitting: Cardiology

## 2013-07-15 DIAGNOSIS — R413 Other amnesia: Secondary | ICD-10-CM | POA: Diagnosis not present

## 2013-07-15 DIAGNOSIS — F329 Major depressive disorder, single episode, unspecified: Secondary | ICD-10-CM | POA: Diagnosis not present

## 2013-07-15 DIAGNOSIS — IMO0002 Reserved for concepts with insufficient information to code with codable children: Secondary | ICD-10-CM | POA: Diagnosis not present

## 2013-07-15 DIAGNOSIS — M899 Disorder of bone, unspecified: Secondary | ICD-10-CM | POA: Diagnosis not present

## 2013-07-15 DIAGNOSIS — J45909 Unspecified asthma, uncomplicated: Secondary | ICD-10-CM | POA: Diagnosis not present

## 2013-07-17 ENCOUNTER — Other Ambulatory Visit: Payer: Self-pay | Admitting: *Deleted

## 2013-07-17 MED ORDER — BUDESONIDE-FORMOTEROL FUMARATE 160-4.5 MCG/ACT IN AERO: 2.0000 | INHALATION_SPRAY | Freq: Two times a day (BID) | RESPIRATORY_TRACT | Status: DC

## 2013-08-26 DIAGNOSIS — H30119 Disseminated chorioretinal inflammation of posterior pole, unspecified eye: Secondary | ICD-10-CM | POA: Diagnosis not present

## 2013-10-30 ENCOUNTER — Encounter: Payer: Self-pay | Admitting: Cardiology

## 2013-10-30 ENCOUNTER — Ambulatory Visit (INDEPENDENT_AMBULATORY_CARE_PROVIDER_SITE_OTHER): Payer: Medicare Other | Admitting: Cardiology

## 2013-10-30 VITALS — BP 162/70 | HR 101 | Wt 134.1 lb

## 2013-10-30 DIAGNOSIS — Z8679 Personal history of other diseases of the circulatory system: Secondary | ICD-10-CM

## 2013-10-30 DIAGNOSIS — I10 Essential (primary) hypertension: Secondary | ICD-10-CM

## 2013-10-30 DIAGNOSIS — E785 Hyperlipidemia, unspecified: Secondary | ICD-10-CM

## 2013-10-30 DIAGNOSIS — R0609 Other forms of dyspnea: Secondary | ICD-10-CM | POA: Diagnosis not present

## 2013-10-30 DIAGNOSIS — R0989 Other specified symptoms and signs involving the circulatory and respiratory systems: Secondary | ICD-10-CM

## 2013-10-30 DIAGNOSIS — R942 Abnormal results of pulmonary function studies: Secondary | ICD-10-CM

## 2013-10-30 NOTE — Patient Instructions (Signed)
NO CHANGE IN MEDICATION   Your physician wants you to follow-up in 12 month Dr  Ellyn Hack.   You will receive a reminder letter in the mail two months in advance. If you don't receive a letter, please call our office to schedule the follow-up appointment.

## 2013-11-01 ENCOUNTER — Encounter: Payer: Self-pay | Admitting: Cardiology

## 2013-11-01 DIAGNOSIS — R942 Abnormal results of pulmonary function studies: Secondary | ICD-10-CM | POA: Insufficient documentation

## 2013-11-01 DIAGNOSIS — R0609 Other forms of dyspnea: Principal | ICD-10-CM | POA: Insufficient documentation

## 2013-11-01 DIAGNOSIS — I10 Essential (primary) hypertension: Secondary | ICD-10-CM | POA: Insufficient documentation

## 2013-11-01 NOTE — Progress Notes (Signed)
PATIENT: Jodi Burns MRN: [id number]  DOB: 14-Jul-1926   DOV:11/01/2013 PCP: Jerlyn Ly, MD  Clinic Note: Chief Complaint  Patient presents with  . Annual Exam    no chest pain , no sob , no edema   HPI: Jodi Burns is a 80 y.o.  female with a PMH below who presents today for annual followup of cardiac risk factors. I initially saw her for mild exertional dyspnea back in 2013. I saw her last in March of 2014 she was evaluated with a CPET test which was less than helpful with submaximal effort but still had a peak VO2 of 76%, which would argue against a significant cardiac etiology. Apparently she had distant history of atrial fibrillation diagnosed long ago by Dr. Lenda Kelp who started her on digoxin. She's been on that ever since.  Interval History: She comes in today pretty much stable with no major complaints. She denies any chest tightness pressure or pressure exertion. He still has some mild dyspnea but only she really is trying to go somewhere fast. She is really more bothered by the discomfort in her knees. Her gait has been somewhat unsteady with her knee and hip discomforts and she recently fell and after losing her footing and hit her left knee. She denies any PND, orthopnea, or edema. No dyspnea at rest. No significant lightheadedness, dizziness, weakness or syncope/near syncope. Her fall was not related to dizziness but was related to musculoskeletal issues.  No TIA/amaurosis fugax symptoms. No claudication. No melena, hematochezia or hematuria.   Past Medical History  Diagnosis Date  . Allergic rhinitis   . Unspecified asthma(493.90)   . Personal history of other diseases of circulatory system   . Personal history of venous thrombosis and embolism 4/94  . Other and unspecified hyperlipidemia   . Subjective visual disturbance, unspecified   . Other retinal disorders(362.89)   . Atrial fibrillation   . Osteopenia    Prior Cardiac Evaluation and Past Surgical  History: Past Surgical History  Procedure Laterality Date  . Breast biopsy    . Appendectomy    . Total abdominal hysterectomy    . Breast biopsy      bilateral  . Rotator cuff repair  7/04, 2003    right, left  . Carpal tunnel release  2006    right  . Perineoplasty      and rectocele  . Transthoracic echocardiogram  February 2011    EF 55-60%. Grade 1 diastolic dysfunction/relaxation abnormality. Mild MR  . Cardiovascular stress test  August 2013    CPET-MET: Submaximal effort, did not reach anaerobic threshold --> peak VO2 76% (not overly concerning for CAD)  . Pulmonary function test  August 2013    CPET-PFTs: FVC 58%, FEV1 48%; breathing reserve less than 10%, normal DLCO; reduced vital capacity -- suggesting obstructive lung disease    Allergies  Allergen Reactions  . Cefaclor     REACTION: gi upset  . Codeine Nausea And Vomiting    Current Outpatient Prescriptions  Medication Sig Dispense Refill  . albuterol (PROAIR HFA) 108 (90 BASE) MCG/ACT inhaler Inhale 2 puffs into the lungs every 6 (six) hours as needed for wheezing or shortness of breath.  1 Inhaler  5  . budesonide-formoterol (SYMBICORT) 160-4.5 MCG/ACT inhaler Inhale 2 puffs into the lungs 2 (two) times daily. Rinse mouth  10.2 g  PRN  . Coenzyme Q10 (COQ10) 100 MG CAPS Take 1 capsule by mouth once.       Marland Kitchen  digoxin (LANOXIN) 0.125 MG tablet Take 125 mcg by mouth daily.        . dorzolamide-timolol (COSOPT) 22.3-6.8 MG/ML ophthalmic solution Place 1 drop into both eyes 2 (two) times daily.        . ergocalciferol (VITAMIN D2) 50000 UNITS capsule Take 50,000 Units by mouth once a week.        . fluticasone (FLONASE) 50 MCG/ACT nasal spray Place 2 sprays into the nose as needed.       . Multiple Vitamins-Minerals (PRESERVISION/LUTEIN) CAPS Take 1 capsule by mouth 2 (two) times daily.        . simvastatin (ZOCOR) 40 MG tablet Take 40 mg by mouth daily.        . vitamin B-12 (CYANOCOBALAMIN) 100 MCG tablet Take  1,000 mcg by mouth daily.        No current facility-administered medications for this visit.    History   Social History Narrative   She is here with her husband - a retired Lexicographer.    They have 2 children, 3 grandchildren.    She does not smoke (never smoked), and does not drink.    She does not really get a lot of exercise besides what I mentioned. She does walk with   her husband but no additional exercise beyond that, but which is still relatively adequate.     ROS: A comprehensive Review of Systems - Negative with exception of symptoms noted in history of present illness  PHYSICAL EXAM BP 162/70  Pulse 101  Wt 134 lb 1.6 oz (60.827 kg)  LMP 08/29/1967 General: She is a pleasant healthy-appearing elderly woman. NAD, A&Ox3.  Answers questions appropriately.   Most of her history is provided by her husband - a retired pediatrician HEENT: NCAT, EOMI, MMM and anicteric sclerae.  Neck: Supple without LAN, JVD, or carotid bruit. Heart: RRR, normal S1, S2. No M/R/G. Nondisplaced PMI. No heaves or thrills.  Lungs: CTAB, nonlabored,normal effort, good air movement. No wheezes, rales, or rhonchi.  Abdomen: Soft/NT/ND/NABS. No HSM. Extremities: No C/C/E.  Neuro: Grossly intact   [id number] today: Yes Rate:101 , Rhythm: Sinus tachycardia;  LAD - otherwise normal EKG   Recent Labs: None available   ASSESSMENT / PLAN: DOE (dyspnea on exertion) Likely multifactorial, however with the results of her PFTs on the CPET test, we cannot exclude pulmonary complement. However, she really is not that symptomatic.  She is on Symbicort and Flonase.  As discussed in previous notes, I am not inclined to proceed with any additional invasive or noninvasive stress testing at this time unless her symptoms were to worsen.  ATRIAL FIBRILLATION, HX OF This is a new revelation to me, as I had not understood why she was on digoxin before.  She seems stable on digoxin, and as long as she is not  having any issues, I would be in favor of continuing it. I did mention that if she were to be sick from either a UTI or any other GI bug where she is not eating and drinking adequately, she should not take digoxin. As has not been any sign of recurrence, I would not consider any anticoagulation.  HTN (hypertension) Her pressure is quite high today, but has been okay in the past. I am reluctant to be overly aggressive treating this since and concerned with possible orthostatic complications. Her vital signs show that she is a bit tachycardic, but on my evaluation measuring both blood pressure and heart rate after talking to her for  a while, her heart rate was down to below 90, and her blood pressure was roughly 140/70 mmHg.   HYPERLIPIDEMIA On simvastatin and CoQ10.  Monitored by PCP  Abnormal PFTs (pulmonary function tests) On albuterol and Symbicort plus Flonase.  Followed by PCP    Orders Placed This Encounter  Procedures  . EKG 12-Lead   No orders of the defined types were placed in this encounter.    Followup: One year   Johngabriel Verde W. Ellyn Hack, M.D., M.S. Interventional Cardiology [facility]

## 2013-11-01 NOTE — Assessment & Plan Note (Addendum)
This is a new revelation to me, as I had not understood why she was on digoxin before.  She seems stable on digoxin, and as long as she is not having any issues, I would be in favor of continuing it. I did mention that if she were to be sick from either a UTI or any other GI bug where she is not eating and drinking adequately, she should not take digoxin. As has not been any sign of recurrence, I would not consider any anticoagulation.

## 2013-11-01 NOTE — Assessment & Plan Note (Addendum)
Likely multifactorial, however with the results of her PFTs on the CPET test, we cannot exclude pulmonary complement. However, she really is not that symptomatic.  She is on Symbicort and Flonase.  As discussed in previous notes, I am not inclined to proceed with any additional invasive or noninvasive stress testing at this time unless her symptoms were to worsen.

## 2013-11-01 NOTE — Assessment & Plan Note (Signed)
On albuterol and Symbicort plus Flonase.  Followed by PCP

## 2013-11-01 NOTE — Assessment & Plan Note (Signed)
On simvastatin and CoQ10.  Monitored by PCP

## 2013-11-01 NOTE — Assessment & Plan Note (Signed)
Her pressure is quite high today, but has been okay in the past. I am reluctant to be overly aggressive treating this since and concerned with possible orthostatic complications. Her vital signs show that she is a bit tachycardic, but on my evaluation measuring both blood pressure and heart rate after talking to her for a while, her heart rate was down to below 90, and her blood pressure was roughly 140/70 mmHg.

## 2013-12-09 DIAGNOSIS — M899 Disorder of bone, unspecified: Secondary | ICD-10-CM | POA: Diagnosis not present

## 2013-12-09 DIAGNOSIS — M949 Disorder of cartilage, unspecified: Secondary | ICD-10-CM | POA: Diagnosis not present

## 2013-12-09 DIAGNOSIS — E538 Deficiency of other specified B group vitamins: Secondary | ICD-10-CM | POA: Diagnosis not present

## 2013-12-09 DIAGNOSIS — R82998 Other abnormal findings in urine: Secondary | ICD-10-CM | POA: Diagnosis not present

## 2013-12-09 DIAGNOSIS — D518 Other vitamin B12 deficiency anemias: Secondary | ICD-10-CM | POA: Diagnosis not present

## 2013-12-09 DIAGNOSIS — Z79899 Other long term (current) drug therapy: Secondary | ICD-10-CM | POA: Diagnosis not present

## 2013-12-09 DIAGNOSIS — E785 Hyperlipidemia, unspecified: Secondary | ICD-10-CM | POA: Diagnosis not present

## 2013-12-09 DIAGNOSIS — E559 Vitamin D deficiency, unspecified: Secondary | ICD-10-CM | POA: Diagnosis not present

## 2013-12-09 DIAGNOSIS — R809 Proteinuria, unspecified: Secondary | ICD-10-CM | POA: Diagnosis not present

## 2013-12-09 DIAGNOSIS — I4891 Unspecified atrial fibrillation: Secondary | ICD-10-CM | POA: Diagnosis not present

## 2013-12-15 DIAGNOSIS — R413 Other amnesia: Secondary | ICD-10-CM | POA: Diagnosis not present

## 2013-12-15 DIAGNOSIS — D518 Other vitamin B12 deficiency anemias: Secondary | ICD-10-CM | POA: Diagnosis not present

## 2013-12-15 DIAGNOSIS — I4891 Unspecified atrial fibrillation: Secondary | ICD-10-CM | POA: Diagnosis not present

## 2013-12-15 DIAGNOSIS — M1990 Unspecified osteoarthritis, unspecified site: Secondary | ICD-10-CM | POA: Diagnosis not present

## 2013-12-15 DIAGNOSIS — E785 Hyperlipidemia, unspecified: Secondary | ICD-10-CM | POA: Diagnosis not present

## 2013-12-15 DIAGNOSIS — Z0000 Encounter for general adult medical examination without abnormal findings: Secondary | ICD-10-CM | POA: Diagnosis not present

## 2013-12-15 DIAGNOSIS — R0609 Other forms of dyspnea: Secondary | ICD-10-CM | POA: Diagnosis not present

## 2013-12-15 DIAGNOSIS — J45909 Unspecified asthma, uncomplicated: Secondary | ICD-10-CM | POA: Diagnosis not present

## 2013-12-22 DIAGNOSIS — Z1212 Encounter for screening for malignant neoplasm of rectum: Secondary | ICD-10-CM | POA: Diagnosis not present

## 2014-01-15 ENCOUNTER — Encounter: Payer: Self-pay | Admitting: Internal Medicine

## 2014-01-15 ENCOUNTER — Ambulatory Visit (INDEPENDENT_AMBULATORY_CARE_PROVIDER_SITE_OTHER): Payer: Medicare Other | Admitting: Internal Medicine

## 2014-01-15 VITALS — BP 120/74 | HR 115 | Ht 64.0 in | Wt 132.8 lb

## 2014-01-15 DIAGNOSIS — J45909 Unspecified asthma, uncomplicated: Secondary | ICD-10-CM | POA: Diagnosis not present

## 2014-01-15 DIAGNOSIS — J301 Allergic rhinitis due to pollen: Secondary | ICD-10-CM

## 2014-01-15 DIAGNOSIS — J45998 Other asthma: Secondary | ICD-10-CM

## 2014-01-15 NOTE — Progress Notes (Signed)
Patient ID: Jodi Burns, female    DOB: 1926/06/15, 80 y.o.   MRN: [id number]  HPI 04/25/11- 22 yoF former smoker followed for asthma complicated by hx PE, Hx AFib, legally blind( Histo retinitis)     Husband here ([name]) Last here April 22, 2010 She fractured her right tibia falling this Spring, delaying intended cardiac stress test pending with [facility] Glenetta Hew, MD. No significant respiratory issues. She rarely uses her rescue inhaler. We discussed reducing her Symbicort to 1 puff twice daily.  Husband asks about assessing her pulmonary status. Denies cough or phlegm, but admits some nasal drip.  We discussed her eyesight/ glaucoma and avoidance of anticholinergics to dry nasal drip.   06/20/11- 24 yoF former smoker followed for asthma complicated by hx PE, Hx AFib, legally blind( Histo retinitis)     Husband here ([name])  Occasional clear nasal discharge never tried Nasalcrom and says the amount of drainage is not bothering her. She feels "fine", better than in a long time. Denies wheeze or shortness of breath. Continues Symbicort. Goes for walks with her husband.  12/19/11- 3 yoF former smoker followed for asthma complicated by hx PE, Hx AFib, legally blind( Histo retinitis)     Husband here ([name])  Well-controlled rhinitis and asthma. Using rescue inhaler not more than once a week. Astepro nasal spray was no help. Symbicort has used twice daily. She walks regularly and notices a little more nasal congestion now in spring pollen season.  01/02/13- 38 yoF former smoker followed for asthma complicated by hx PE, Hx AFib, legally blind( Histo retinitis)     Husband here ([name]) FOLLOWS HLK:TGYBWL any SOB, wheezing, cough, or congestion. Pt is having nasal drainage from pollen increased. Mainly notices dyspnea on exertion but has been doing very well with walks in the park. Her cardiologist attributes dyspnea more to lungs than to  heart. Husband mentions a persistent clear nasal drip but she is not concerned by it. Medications reviewed.  01/15/14- 7 yoF former smoker followed for asthma complicated by hx PE, Hx AFib, legally blind( Histo retinitis)     Husband here ([name]) FOLLOWS FOR: has clear drainage thats ongoing. Denies any SOB or wheezing unless carrying heavy loads or exerting herself too much., She and her husband both say that she is doing "fine" using 1 puff of Symbicort twice daily.  Review of Systems- see HPI Constitutional:   No-   weight loss, night sweats, fevers, chills, fatigue, lassitude. HEENT:   No-  headaches, difficulty swallowing, tooth/dental problems, sore throat,       No-  sneezing, itching, ear ache,  +nasal congestion, +post nasal drip,  CV:  No-   chest pain, orthopnea, PND, swelling in lower extremities, anasarca, dizziness, palpitations Resp: + shortness of breath with exertion , but she is not very active.Marland Kitchen              No-   productive cough,  No non-productive cough,  No-  coughing up of blood.              No-   change in color of mucus.  No- wheezing.   Skin: No-   rash or lesions. GI:  No-   heartburn, indigestion, abdominal pain, nausea, vomiting,  GU: No-   dysuria,  MS:  No-   joint pain or swelling.   Neuro- nothing unusual Psych:  No- change in mood or affect. No depression or anxiety.  No  memory loss. Ior Objective:   Physical Exam General- Alert, Oriented, Affect-appropriate, Distress- none acute  Slender cheerful Skin- rash-none, lesions- none, excoriation- none Lymphadenopathy- none Head- atraumatic            Eyes- Gross vision -limited to light and dark,  conjunctivae clear secretions            Ears- Hearing, canals normal                          Nose- Clear, No-Septal dev, mucus, polyps, erosion, perforation             Throat- Mallampati II , mucosa clear , drainage- none, tonsils- atrophic Neck- flexible , trachea midline, no stridor , thyroid nl,  carotid no bruit Chest - symmetrical excursion , unlabored           Heart/CV- RRR , no murmur , no gallop  , no rub, nl s1 s2                           - JVD- none , edema- none, stasis changes- none, varices- none           Lung- clear to P&A, wheeze- none, cough- none , dullness-none, rub- none           Chest wall-  Abd-  Br/ Gen/ Rectal- Not done, not indicated Extrem- cyanosis- none, clubbing, none, atrophy- none, strength- nl Neuro- grossly intact to observation

## 2014-01-15 NOTE — Patient Instructions (Signed)
We can refill your albuterol rescue inhaler and Symbicort as needed  It would be ok to "test the water" by seeing how you do without Symbicort, and restarting if you notice return of cough,/ tightness/ wheeze  Please call for refills or as needed

## 2014-02-05 DIAGNOSIS — M949 Disorder of cartilage, unspecified: Secondary | ICD-10-CM | POA: Diagnosis not present

## 2014-02-05 DIAGNOSIS — M899 Disorder of bone, unspecified: Secondary | ICD-10-CM | POA: Diagnosis not present

## 2014-02-05 DIAGNOSIS — IMO0002 Reserved for concepts with insufficient information to code with codable children: Secondary | ICD-10-CM | POA: Diagnosis not present

## 2014-02-05 DIAGNOSIS — Z79899 Other long term (current) drug therapy: Secondary | ICD-10-CM | POA: Diagnosis not present

## 2014-02-17 ENCOUNTER — Telehealth: Payer: Self-pay

## 2014-02-17 NOTE — Telephone Encounter (Signed)
Lmtcb-let her know her AEX is scheduled for 04/14/14 at 1:00 if this works for her//kn

## 2014-02-18 NOTE — Telephone Encounter (Signed)
Patient calling Jodi Burns back. Gave pt the appt information she said the appointment is okay for her.

## 2014-03-02 DIAGNOSIS — H31019 Macula scars of posterior pole (postinflammatory) (post-traumatic), unspecified eye: Secondary | ICD-10-CM | POA: Diagnosis not present

## 2014-03-02 DIAGNOSIS — H4011X0 Primary open-angle glaucoma, stage unspecified: Secondary | ICD-10-CM | POA: Diagnosis not present

## 2014-03-02 DIAGNOSIS — H409 Unspecified glaucoma: Secondary | ICD-10-CM | POA: Diagnosis not present

## 2014-03-02 DIAGNOSIS — H2640 Unspecified secondary cataract: Secondary | ICD-10-CM | POA: Diagnosis not present

## 2014-03-04 ENCOUNTER — Other Ambulatory Visit (HOSPITAL_COMMUNITY): Payer: Self-pay | Admitting: Internal Medicine

## 2014-03-04 ENCOUNTER — Ambulatory Visit (HOSPITAL_COMMUNITY): Admission: RE | Admit: 2014-03-04 | Payer: Medicare Other | Source: Ambulatory Visit

## 2014-03-08 NOTE — Assessment & Plan Note (Signed)
Well controlled 

## 2014-03-08 NOTE — Assessment & Plan Note (Signed)
She is doing well enough that it's not clear she needs Symbicort now and I gave her permission to try skipping it if she wants.

## 2014-03-10 DIAGNOSIS — M949 Disorder of cartilage, unspecified: Secondary | ICD-10-CM | POA: Diagnosis not present

## 2014-03-10 DIAGNOSIS — E559 Vitamin D deficiency, unspecified: Secondary | ICD-10-CM | POA: Diagnosis not present

## 2014-03-10 DIAGNOSIS — M899 Disorder of bone, unspecified: Secondary | ICD-10-CM | POA: Diagnosis not present

## 2014-03-10 DIAGNOSIS — Z79899 Other long term (current) drug therapy: Secondary | ICD-10-CM | POA: Diagnosis not present

## 2014-03-10 DIAGNOSIS — IMO0002 Reserved for concepts with insufficient information to code with codable children: Secondary | ICD-10-CM | POA: Diagnosis not present

## 2014-04-01 ENCOUNTER — Other Ambulatory Visit (HOSPITAL_COMMUNITY): Payer: Self-pay | Admitting: Internal Medicine

## 2014-04-01 MED ORDER — ZOLEDRONIC ACID 5 MG/100ML IV SOLN: 5.0000 mg | Freq: Once | INTRAVENOUS | Status: DC

## 2014-04-01 MED ORDER — SODIUM CHLORIDE 0.9 % IV SOLN: INTRAVENOUS | Status: DC

## 2014-04-06 ENCOUNTER — Encounter (HOSPITAL_COMMUNITY): Payer: Self-pay

## 2014-04-06 ENCOUNTER — Ambulatory Visit (HOSPITAL_COMMUNITY)
Admission: RE | Admit: 2014-04-06 | Discharge: 2014-04-06 | Disposition: A | Discharge status: Home / Self Care | Payer: Medicare Other | Source: Ambulatory Visit | Attending: Internal Medicine | Admitting: Internal Medicine

## 2014-04-06 DIAGNOSIS — M810 Age-related osteoporosis without current pathological fracture: Secondary | ICD-10-CM | POA: Insufficient documentation

## 2014-04-06 MED ORDER — ZOLEDRONIC ACID 5 MG/100ML IV SOLN
5.0000 mg | Freq: Once | INTRAVENOUS | Status: AC
  Administered 2014-04-06: 5 mg via INTRAVENOUS
  Filled 2014-04-06: qty 100

## 2014-04-06 MED ORDER — SODIUM CHLORIDE 0.9 % IV SOLN
Freq: Once | INTRAVENOUS | Status: AC
  Administered 2014-04-06: 12:00:00 via INTRAVENOUS

## 2014-04-06 NOTE — Discharge Instructions (Signed)

## 2014-04-14 ENCOUNTER — Ambulatory Visit: Payer: Medicare Other | Admitting: Obstetrics & Gynecology

## 2014-04-20 ENCOUNTER — Ambulatory Visit (INDEPENDENT_AMBULATORY_CARE_PROVIDER_SITE_OTHER): Payer: Medicare Other | Admitting: Obstetrics & Gynecology

## 2014-04-20 ENCOUNTER — Encounter: Payer: Self-pay | Admitting: Obstetrics & Gynecology

## 2014-04-20 VITALS — BP 164/82 | HR 72 | Resp 20 | Ht 61.75 in | Wt 131.4 lb

## 2014-04-20 DIAGNOSIS — Z124 Encounter for screening for malignant neoplasm of cervix: Secondary | ICD-10-CM

## 2014-04-20 DIAGNOSIS — Z01419 Encounter for gynecological examination (general) (routine) without abnormal findings: Secondary | ICD-10-CM

## 2014-04-20 NOTE — Progress Notes (Signed)
80 y.o. G2P2 MarriedCaucasianF here for annual exam.  Had a nice summer.  Leaving on Friday for the beach.  Really excited about this.  Has an upper respiratory infection but this is mild.  No coughing but only runny nose.    No vaginal bleeding.    Had reclast 04/06/14.  Had a hematoma where the IV was placed.  Reports it feels much better.    PCP:  Dr. Joylene Draft.  Saw him about six months ago.    Patient's last menstrual period was 08/29/1967.          Sexually active: No.  The current method of family planning is status post hysterectomy.    Exercising: Yes.    walking Smoker:  Former smoker-quit 17 years ago  Health Maintenance: Pap:  12/20/01 WNL History of abnormal Pap:  no MMG:  05/08/13-normal Colonoscopy:  2012 BMD:   12/05/11-Dr Perini TDaP:  2006-Dr Perini Screening Labs: PCP, Hb today: PCP, Urine today: PCP   reports that she has quit smoking. She has never used smokeless tobacco. She reports that she drinks alcohol. She reports that she does not use illicit drugs.  Past Medical History  Diagnosis Date  . Allergic rhinitis   . Unspecified asthma(493.90)   . Personal history of other diseases of circulatory system   . Personal history of venous thrombosis and embolism 4/94  . Other and unspecified hyperlipidemia   . Subjective visual disturbance, unspecified   . Other retinal disorders(362.89)   . Atrial fibrillation   . Osteopenia     Past Surgical History  Procedure Laterality Date  . Breast biopsy    . Appendectomy    . Total abdominal hysterectomy    . Breast biopsy      bilateral  . Rotator cuff repair  7/04, 2003    right, left  . Carpal tunnel release  2006    right  . Perineoplasty      and rectocele  . Transthoracic echocardiogram  February 2011    EF 55-60%. Grade 1 diastolic dysfunction/relaxation abnormality. Mild MR  . Cardiovascular stress test  August 2013    CPET-MET: Submaximal effort, did not reach anaerobic threshold --> peak VO2 76% (not  overly concerning for CAD)  . Pulmonary function test  August 2013    CPET-PFTs: FVC 58%, FEV1 48%; breathing reserve less than 10%, normal DLCO; reduced vital capacity -- suggesting obstructive lung disease    Current Outpatient Prescriptions  Medication Sig Dispense Refill  . acetaminophen (TYLENOL) 325 MG tablet Take 650 mg by mouth every 6 (six) hours as needed.      . Coenzyme Q10 (COQ10) 100 MG CAPS Take 1 capsule by mouth once.       . digoxin (LANOXIN) 0.125 MG tablet Take 125 mcg by mouth daily.        . dorzolamide-timolol (COSOPT) 22.3-6.8 MG/ML ophthalmic solution Place 1 drop into both eyes 2 (two) times daily.        . ergocalciferol (VITAMIN D2) 50000 UNITS capsule Take 50,000 Units by mouth once a week.        . fluticasone (FLONASE) 50 MCG/ACT nasal spray Place 2 sprays into the nose as needed.       . Multiple Vitamins-Minerals (PRESERVISION/LUTEIN) CAPS Take 1 capsule by mouth 2 (two) times daily.        . simvastatin (ZOCOR) 40 MG tablet Take 40 mg by mouth daily.        . vitamin B-12 (CYANOCOBALAMIN) 100 MCG  tablet Take 1,000 mcg by mouth daily.       Marland Kitchen albuterol (PROAIR HFA) 108 (90 BASE) MCG/ACT inhaler Inhale 2 puffs into the lungs every 6 (six) hours as needed for wheezing or shortness of breath.  1 Inhaler  5  . budesonide-formoterol (SYMBICORT) 160-4.5 MCG/ACT inhaler Inhale 2 puffs into the lungs 2 (two) times daily. Rinse mouth  10.2 g  PRN   No current facility-administered medications for this visit.    Family History  Problem Relation Age of Onset  . Diabetes Paternal Aunt   . Osteoporosis Mother     ROS:  Pertinent items are noted in HPI.  Otherwise, a comprehensive ROS was negative.  Exam:   BP 164/82  Pulse 72  Resp 20  Ht 5' 1.75" (1.568 m)  Wt 131 lb 6.4 oz (59.603 kg)  BMI 24.24 kg/m2  LMP 08/29/1967  Weight change: -1#  Height: 5' 1.75" (156.8 cm)  Ht Readings from Last 3 Encounters:  04/20/14 5' 1.75" (1.568 m)  01/15/14 _0  (1.626 m)   01/02/13 _1  (1.626 m)    General appearance: alert, cooperative and appears stated age Head: Normocephalic, without obvious abnormality, atraumatic Neck: no adenopathy, supple, symmetrical, trachea midline and thyroid normal to inspection and palpation Lungs: clear to auscultation bilaterally Breasts: normal appearance, no masses or tenderness Heart: regular rate and rhythm Abdomen: soft, non-tender; bowel sounds normal; no masses,  no organomegaly Extremities: extremities normal, atraumatic, no cyanosis or edema Skin: Skin color, texture, turgor normal. No rashes or lesions Lymph nodes: Cervical, supraclavicular, and axillary nodes normal. No abnormal inguinal nodes palpated Neurologic: Grossly normal   Pelvic: External genitalia:  no lesions              Urethra:  normal appearing urethra with no masses, tenderness or lesions              Bartholins and Skenes: normal                 Vagina: normal appearing vagina with appropriate, atrophic changes              Cervix: absent              Pap taken: No. 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  Spouse checks BP at home.    P: Mammogram yearly.  Sees Dr. Joylene Draft yearly. See Dr. Annamaria Boots, pulmonology, every six months.  return annually or prn  An After Visit Summary was printed and given to the patient.

## 2014-04-21 ENCOUNTER — Telehealth: Payer: Self-pay

## 2014-04-21 NOTE — Telephone Encounter (Signed)
Pts spouse is calling kelly back

## 2014-04-21 NOTE — Telephone Encounter (Signed)
lmtcb-to get her AEX scheduled for next year after 04/21/15//kn

## 2014-05-26 NOTE — Telephone Encounter (Signed)
Lmtcb//kn 

## 2014-05-27 ENCOUNTER — Telehealth: Payer: Self-pay | Admitting: Obstetrics & Gynecology

## 2014-05-27 NOTE — Telephone Encounter (Signed)
Pts spouse is calling kelly back. I got pt scheduled for 04/30/15 at 1:00 pm.  Maryann Conners, CMA at 05/26/2014 3:39 PM    Status: Signed       Lmtcb//kn       Caro Hight Decker at 04/21/2014 2:28 PM     Status: Signed        Pts spouse is calling kelly back        Maryann Conners, CMA at 04/21/2014 9:48 AM     Status: Signed        lmtcb-to get her AEX scheduled for next year after 04/21/15//kn

## 2014-06-01 DIAGNOSIS — Z23 Encounter for immunization: Secondary | ICD-10-CM | POA: Diagnosis not present

## 2014-06-01 NOTE — Telephone Encounter (Signed)
Patient scheduled appointment for 04/30/15 at 1:00//kn

## 2014-06-10 NOTE — Telephone Encounter (Signed)
Okay to close

## 2014-06-11 NOTE — Telephone Encounter (Signed)
Yes, thank you//kn

## 2014-06-29 ENCOUNTER — Encounter: Payer: Self-pay | Admitting: Obstetrics & Gynecology

## 2014-09-03 DIAGNOSIS — Z961 Presence of intraocular lens: Secondary | ICD-10-CM | POA: Diagnosis not present

## 2014-09-03 DIAGNOSIS — H31013 Macula scars of posterior pole (postinflammatory) (post-traumatic), bilateral: Secondary | ICD-10-CM | POA: Diagnosis not present

## 2014-09-03 DIAGNOSIS — H26492 Other secondary cataract, left eye: Secondary | ICD-10-CM | POA: Diagnosis not present

## 2014-09-07 ENCOUNTER — Telehealth: Payer: Self-pay | Admitting: Cardiology

## 2014-09-08 NOTE — Telephone Encounter (Signed)
Close encounter 

## 2014-10-05 ENCOUNTER — Other Ambulatory Visit: Payer: Self-pay | Admitting: *Deleted

## 2014-10-05 MED ORDER — BUDESONIDE-FORMOTEROL FUMARATE 160-4.5 MCG/ACT IN AERO: 2.0000 | INHALATION_SPRAY | Freq: Two times a day (BID) | RESPIRATORY_TRACT | Status: DC

## 2014-10-05 NOTE — Telephone Encounter (Signed)
Next OV 01/19/15; refilled Symbicort.  Nothing further needed.

## 2014-11-12 ENCOUNTER — Ambulatory Visit (INDEPENDENT_AMBULATORY_CARE_PROVIDER_SITE_OTHER): Payer: Medicare Other | Admitting: Cardiology

## 2014-11-12 ENCOUNTER — Encounter: Payer: Self-pay | Admitting: Cardiology

## 2014-11-12 VITALS — BP 132/78 | HR 90 | Ht 62.0 in | Wt 130.4 lb

## 2014-11-12 DIAGNOSIS — I4891 Unspecified atrial fibrillation: Secondary | ICD-10-CM | POA: Diagnosis not present

## 2014-11-12 DIAGNOSIS — R0609 Other forms of dyspnea: Secondary | ICD-10-CM | POA: Diagnosis not present

## 2014-11-12 DIAGNOSIS — E785 Hyperlipidemia, unspecified: Secondary | ICD-10-CM | POA: Diagnosis not present

## 2014-11-12 DIAGNOSIS — I10 Essential (primary) hypertension: Secondary | ICD-10-CM | POA: Diagnosis not present

## 2014-11-12 NOTE — Progress Notes (Signed)
PCP: Jerlyn Ly, MD  Clinic Note: Chief Complaint  Patient presents with  . Annual Exam    no chest pain , sensation of shortness of breath - fleeting , no edema, tired easily  . Palpitations    HPI: Jodi Burns is a 81 y.o. female with a PMH below who presents today for annual cardiology followup. She has a long history the palpitation episodes and was previously diagnosis atrial fibrillation. She has been on a stable low dose of digoxin for many years.  I have been seeing for several years. She has poor exercise tolerance and did not do well on an exercise tolerance test. We decided not to go into any additional evaluation for potential ischemia. Her echocardiogram as noted below. She is a former patient of Dr. Lenda Kelp  Past Medical History  Diagnosis Date  . Allergic rhinitis   . Unspecified asthma(493.90)   . Personal history of other diseases of circulatory system   . Personal history of venous thrombosis and embolism 4/94  . Other and unspecified hyperlipidemia   . Subjective visual disturbance, unspecified   . Other retinal disorders(362.89)   . Atrial fibrillation   . Osteopenia     Prior Cardiac Evaluation and Past Surgical History: Past Surgical History  Procedure Laterality Date  . Breast biopsy    . Appendectomy    . Total abdominal hysterectomy    . Breast biopsy      bilateral  . Rotator cuff repair  7/04, 2003    right, left  . Carpal tunnel release  2006    right  . Perineoplasty      and rectocele  . Transthoracic echocardiogram  February 2011    EF 55-60%. Grade 1 diastolic dysfunction/relaxation abnormality. Mild MR  . Cardiovascular stress test  August 2013    CPET-MET: Submaximal effort, did not reach anaerobic threshold --> peak VO2 76% (not overly concerning for CAD)  . Pulmonary function test  August 2013    CPET-PFTs: FVC 58%, FEV1 48%; breathing reserve less than 10%, normal DLCO; reduced vital capacity -- suggesting obstructive  lung disease    Interval History:  She is relatively stable without any major complaints. She did have one episode where she felt more frequent than usual it lasted AVE oropharynx are associated with a sense of rapid heartbeats and anxiety type symptoms. She just sort of frozen up and didn't do much. She is very tired after that. She didn't notice the symptoms worse with moving around or walking, but she really doesn't walk much anyway. Her husband seem to feel like he was an anxiety episode because it got better with coughing her down and giving her an anxiolytic. She also took a inhaler that made things feel better. That appears to be only episode she has had. She does get tired easily but says her knee is really bothering her. The most part she denies any significant palpitation episodes, certainly nothing that has lasted more than that one episode. Her husband did not feel an irregular heartbeat. He said that it was maybe a little tachycardic but not irregular.her lungs were clear but she did wheeze a little bit  Otherwise, with the exception of the above-noted symptoms, from aPaul a cardiac standpoint she denies chest pain or shortness of breath with rest or exertion. No PND, orthopnea or edema. No palpitations, lightheadedness, dizziness, weakness or syncope/near syncope. No TIA/amaurosis fugax symptoms.  ROS: A comprehensive was performed. Review of Systems  Constitutional: Negative  for malaise/fatigue.  HENT: Negative for nosebleeds.   Respiratory: Negative for cough, sputum production and wheezing.   Cardiovascular: Positive for palpitations (Intermittent foot nothing lasting). Negative for claudication.  Gastrointestinal: Positive for constipation. Negative for blood in stool and melena.  Genitourinary: Negative for hematuria.  Musculoskeletal: Positive for joint pain.  Neurological: Positive for dizziness (Unsteady gait). Negative for sensory change, speech change, focal weakness and  loss of consciousness.  Psychiatric/Behavioral: Positive for memory loss.    Current Outpatient Prescriptions on File Prior to Visit  Medication Sig Dispense Refill  . acetaminophen (TYLENOL) 325 MG tablet Take 650 mg by mouth every 6 (six) hours as needed.    Marland Kitchen albuterol (PROAIR HFA) 108 (90 BASE) MCG/ACT inhaler Inhale 2 puffs into the lungs every 6 (six) hours as needed for wheezing or shortness of breath. 1 Inhaler 5  . budesonide-formoterol (SYMBICORT) 160-4.5 MCG/ACT inhaler Inhale 2 puffs into the lungs 2 (two) times daily. Rinse mouth 10.2 g 3  . Coenzyme Q10 (COQ10) 100 MG CAPS Take 2 capsules by mouth once.     . digoxin (LANOXIN) 0.125 MG tablet Take 125 mcg by mouth daily.      . dorzolamide-timolol (COSOPT) 22.3-6.8 MG/ML ophthalmic solution Place 1 drop into both eyes 2 (two) times daily.      . ergocalciferol (VITAMIN D2) 50000 UNITS capsule Take 50,000 Units by mouth once a week.      . fluticasone (FLONASE) 50 MCG/ACT nasal spray Place 2 sprays into the nose as needed.     . Multiple Vitamins-Minerals (PRESERVISION/LUTEIN) CAPS Take 1 capsule by mouth 2 (two) times daily.      . simvastatin (ZOCOR) 40 MG tablet Take 40 mg by mouth daily.      . vitamin B-12 (CYANOCOBALAMIN) 100 MCG tablet Take 1,000 mcg by mouth daily.      No current facility-administered medications on file prior to visit.   Allergies  Allergen Reactions  . Cefaclor     REACTION: gi upset  . Codeine Nausea And Vomiting   today History  Substance Use Topics  . Smoking status: Former Research scientist (life sciences)  . Smokeless tobacco: Never Used     Comment: quit 17 years ago  . Alcohol Use: Yes     Comment: high ball prior to dinner   Family History  Problem Relation Age of Onset  . Diabetes Paternal Aunt   . Osteoporosis Mother     Wt Readings from Last 3 Encounters:  11/12/14 130 lb 6.4 oz (59.149 kg)  04/20/14 131 lb 6.4 oz (59.603 kg)  01/15/14 132 lb 12.8 oz (60.238 kg)    PHYSICAL EXAM BP 132/78 mmHg   Pulse 90  Ht '5\' 2"'  (1.575 m)  Wt 130 lb 6.4 oz (59.149 kg)  BMI 23.84 kg/m2  LMP 08/29/1967 General: She is a pleasant healthy-appearing elderly woman. NAD, A&Ox3 - but poor historian (Most of her history is provided by her husband - a retired pediatrician), Is essentially legally blind & has a very unsteady gait.   HEENT: NCAT, EOMI, MMM and anicteric sclerae.  Neck: Supple without LAN, JVD, or carotid bruit. Heart: RRR, normal S1, S2. No M/R/G. Nondisplaced PMI. No heaves or thrills.  Lungs: CTAB, nonlabored,normal effort, good air movement. No wheezes, rales, or rhonchi.  Abdomen: Soft/NT/ND/NABS. No HSM. Extremities: No C/C/E.  Neuro: Grossly normal with the exception of unsteady gait & near blindness.  Pleasant mood & affect. Poor memory & easily confused.   Adult ECG Report  Rate: 90 ;  Rhythm: normal sinus rhythm, sinus arrhythmia and LAFB with borderline LVH bacteria. Poor wave progression in precordial leads (computer read as anteroseptal infarct, age undetermined)  Narrative Interpretation: stable EKG  Recent Labs:  N/A   FOLLOWED BY PCP  ASSESSMENT / PLAN: Problem List Items Addressed This Visit    Atrial fibrillation, unspecified (Chronic)    As best I can tell, she has not really had any breakthrough episodes. It is not seem that that episode she mentioned was A. Fib, because her husband is a retired Engineer, drilling and should not feel a pulse but would be regular versus irregular. Regardless, this is not very frequent episode issue for her. Would just simply continue with her ICD shocks because "it seems to be working ".      DOE (dyspnea on exertion) - Primary (Chronic)    Again quite likely multifactorial. Cardiac evaluation suggested that there may be some issues there but cannot exclude pulmonary component. The problem is his we really can't test her exertional functional status because she is very limited therefore deconditioning is also very plausible explanation. I  continue to recommend as her husband does that she keeps trying to walk with him on daily walks to maintain her exercise stamina. Even if it means short little distances with occasional pauses.  She did have some abnormal Pulmonary function tests      Relevant Orders   EKG 12-Lead   Essential hypertension (Chronic)    Stable, well controlled on no medications.      Relevant Orders   EKG 12-Lead   Hyperlipidemia with target LDL less than 130 (Chronic)    Monitored by her PCP. I would strongly consider possibly stopping simvastatin versus switching to a different statin because of her memory loss issues. At this point I think maintenance therapy with statin may not be best for quality life. She is not showing any signs of ischemic heart disease. I would consider becoming more lenient with control. Will defer to PCP         Orders Placed This Encounter  Procedures  . EKG 12-Lead   Meds ordered this encounter  Medications  . loratadine (CLARITIN) 10 MG tablet    Sig: Take 10 mg by mouth daily.   Followup: 1 yr    Leonie Man, M.D., M.S. Interventional Cardiologist   Pager # [phone]     \

## 2014-11-12 NOTE — Patient Instructions (Signed)
No change in medications  Your physician wants you to follow-up in 12 months DR HARDING--30 MIN APPOINMENT.  You will receive a reminder letter in the mail two months in advance. If you don't receive a letter, please call our office to schedule the follow-up appointment.

## 2014-11-14 ENCOUNTER — Encounter: Payer: Self-pay | Admitting: Cardiology

## 2014-11-14 NOTE — Assessment & Plan Note (Addendum)
Monitored by her PCP. I would strongly consider possibly stopping simvastatin versus switching to a different statin because of her memory loss issues. At this point I think maintenance therapy with statin may not be best for quality life. She is not showing any signs of ischemic heart disease. I would consider becoming more lenient with control. Will defer to PCP

## 2014-11-14 NOTE — Assessment & Plan Note (Addendum)
Again quite likely multifactorial. Cardiac evaluation suggested that there may be some issues there but cannot exclude pulmonary component. The problem is his we really can't test her exertional functional status because she is very limited therefore deconditioning is also very plausible explanation. I continue to recommend as her husband does that she keeps trying to walk with him on daily walks to maintain her exercise stamina. Even if it means short little distances with occasional pauses.  She did have some abnormal Pulmonary function tests

## 2014-11-14 NOTE — Assessment & Plan Note (Signed)
Stable, well controlled on no medications.

## 2014-11-14 NOTE — Assessment & Plan Note (Signed)
As best I can tell, she has not really had any breakthrough episodes. It is not seem that that episode she mentioned was A. Fib, because her husband is a retired Engineer, drilling and should not feel a pulse but would be regular versus irregular. Regardless, this is not very frequent episode issue for her. Would just simply continue with her ICD shocks because "it seems to be working ".

## 2014-12-16 DIAGNOSIS — E538 Deficiency of other specified B group vitamins: Secondary | ICD-10-CM | POA: Diagnosis not present

## 2014-12-18 DIAGNOSIS — E559 Vitamin D deficiency, unspecified: Secondary | ICD-10-CM | POA: Diagnosis not present

## 2014-12-18 DIAGNOSIS — E785 Hyperlipidemia, unspecified: Secondary | ICD-10-CM | POA: Diagnosis not present

## 2014-12-18 DIAGNOSIS — R8299 Other abnormal findings in urine: Secondary | ICD-10-CM | POA: Diagnosis not present

## 2014-12-18 DIAGNOSIS — D519 Vitamin B12 deficiency anemia, unspecified: Secondary | ICD-10-CM | POA: Diagnosis not present

## 2014-12-18 DIAGNOSIS — N390 Urinary tract infection, site not specified: Secondary | ICD-10-CM | POA: Diagnosis not present

## 2014-12-23 DIAGNOSIS — F329 Major depressive disorder, single episode, unspecified: Secondary | ICD-10-CM | POA: Diagnosis not present

## 2014-12-23 DIAGNOSIS — R413 Other amnesia: Secondary | ICD-10-CM | POA: Diagnosis not present

## 2014-12-23 DIAGNOSIS — J45909 Unspecified asthma, uncomplicated: Secondary | ICD-10-CM | POA: Diagnosis not present

## 2014-12-23 DIAGNOSIS — Z1389 Encounter for screening for other disorder: Secondary | ICD-10-CM | POA: Diagnosis not present

## 2014-12-23 DIAGNOSIS — I480 Paroxysmal atrial fibrillation: Secondary | ICD-10-CM | POA: Diagnosis not present

## 2014-12-23 DIAGNOSIS — Z0000 Encounter for general adult medical examination without abnormal findings: Secondary | ICD-10-CM | POA: Diagnosis not present

## 2014-12-23 DIAGNOSIS — D519 Vitamin B12 deficiency anemia, unspecified: Secondary | ICD-10-CM | POA: Diagnosis not present

## 2014-12-23 DIAGNOSIS — Z6823 Body mass index (BMI) 23.0-23.9, adult: Secondary | ICD-10-CM | POA: Diagnosis not present

## 2014-12-23 DIAGNOSIS — E559 Vitamin D deficiency, unspecified: Secondary | ICD-10-CM | POA: Diagnosis not present

## 2014-12-23 DIAGNOSIS — E785 Hyperlipidemia, unspecified: Secondary | ICD-10-CM | POA: Diagnosis not present

## 2014-12-23 DIAGNOSIS — M1990 Unspecified osteoarthritis, unspecified site: Secondary | ICD-10-CM | POA: Diagnosis not present

## 2014-12-23 DIAGNOSIS — H6123 Impacted cerumen, bilateral: Secondary | ICD-10-CM | POA: Diagnosis not present

## 2014-12-30 DIAGNOSIS — L821 Other seborrheic keratosis: Secondary | ICD-10-CM | POA: Diagnosis not present

## 2014-12-30 DIAGNOSIS — L570 Actinic keratosis: Secondary | ICD-10-CM | POA: Diagnosis not present

## 2014-12-30 DIAGNOSIS — L218 Other seborrheic dermatitis: Secondary | ICD-10-CM | POA: Diagnosis not present

## 2014-12-30 DIAGNOSIS — Z85828 Personal history of other malignant neoplasm of skin: Secondary | ICD-10-CM | POA: Diagnosis not present

## 2014-12-30 DIAGNOSIS — L720 Epidermal cyst: Secondary | ICD-10-CM | POA: Diagnosis not present

## 2015-01-19 ENCOUNTER — Ambulatory Visit: Payer: Medicare Other | Admitting: Internal Medicine

## 2015-01-19 ENCOUNTER — Ambulatory Visit (INDEPENDENT_AMBULATORY_CARE_PROVIDER_SITE_OTHER): Payer: Medicare Other | Admitting: Internal Medicine

## 2015-01-19 ENCOUNTER — Encounter: Payer: Self-pay | Admitting: Internal Medicine

## 2015-01-19 VITALS — BP 120/70 | HR 84 | Ht 64.0 in | Wt 129.0 lb

## 2015-01-19 DIAGNOSIS — J45998 Other asthma: Secondary | ICD-10-CM

## 2015-01-19 DIAGNOSIS — J301 Allergic rhinitis due to pollen: Secondary | ICD-10-CM | POA: Diagnosis not present

## 2015-01-19 MED ORDER — AZELASTINE-FLUTICASONE 137-50 MCG/ACT NA SUSP: 2.0000 | Freq: Every day | NASAL | Status: DC

## 2015-01-19 MED ORDER — AZELASTINE HCL 0.1 % NA SOLN: NASAL | Status: DC

## 2015-01-19 NOTE — Progress Notes (Signed)
Patient ID: Jodi Burns, female    DOB: Feb 27, 1926, 81 y.o.   MRN: [id number]  HPI 04/25/11- 54 yoF former smoker followed for asthma complicated by hx PE, Hx AFib, legally blind( Histo retinitis)     Husband here ([name]) Last here April 22, 2010 She fractured her right tibia falling this Spring, delaying intended cardiac stress test pending with [facility] Glenetta Hew, MD. No significant respiratory issues. She rarely uses her rescue inhaler. We discussed reducing her Symbicort to 1 puff twice daily.  Husband asks about assessing her pulmonary status. Denies cough or phlegm, but admits some nasal drip.  We discussed her eyesight/ glaucoma and avoidance of anticholinergics to dry nasal drip.   06/20/11- 30 yoF former smoker followed for asthma complicated by hx PE, Hx AFib, legally blind( Histo retinitis)     Husband here ([name])  Occasional clear nasal discharge never tried Nasalcrom and says the amount of drainage is not bothering her. She feels "fine", better than in a long time. Denies wheeze or shortness of breath. Continues Symbicort. Goes for walks with her husband.  12/19/11- 66 yoF former smoker followed for asthma complicated by hx PE, Hx AFib, legally blind( Histo retinitis)     Husband here ([name])  Well-controlled rhinitis and asthma. Using rescue inhaler not more than once a week. Astepro nasal spray was no help. Symbicort has used twice daily. She walks regularly and notices a little more nasal congestion now in spring pollen season.  01/02/13- 82 yoF former smoker followed for asthma complicated by hx PE, Hx AFib, legally blind( Histo retinitis)     Husband here ([name]) FOLLOWS STM:[id number] any SOB, wheezing, cough, or congestion. Pt is having nasal drainage from pollen increased. Mainly notices dyspnea on exertion but has been doing very well with walks in the park. Her cardiologist attributes dyspnea more to lungs than to  heart. Husband mentions a persistent clear nasal drip but she is not concerned by it. Medications reviewed.  01/15/14- 14 yoF former smoker followed for asthma complicated by hx PE, Hx AFib, legally blind( Histo retinitis)     Husband here ([name]) FOLLOWS FOR: has clear drainage thats ongoing. Denies any SOB or wheezing unless carrying heavy loads or exerting herself too much., She and her husband both say that she is doing "fine" using 1 puff of Symbicort twice daily.  01/19/15- 60 yoF former smoker followed for asthma complicated by hx PE, Hx AFib, legally blind( Histo retinitis)     Husband here ([name]) Reports: nasal drainage constant, wants to get on some nasal spray to help with the drainage Claritin and Flonase have been insufficient for persistent watery rhinorrhea with no real pattern. Little sneezing or itching. Asthma control has been good since they went back up to Symbicort 2 puffs twice daily for the spring season. Not needing rescue inhaler.  Review of Systems- see HPI Constitutional:   No-   weight loss, night sweats, fevers, chills, fatigue, lassitude. HEENT:   No-  headaches, difficulty swallowing, tooth/dental problems, sore throat,       No-  sneezing, itching, ear ache, nasal congestion, +post nasal drip,  CV:  No-   chest pain, orthopnea, PND, swelling in lower extremities, anasarca, dizziness, palpitations Resp: + shortness of breath with exertion, but she is not very active.Marland Kitchen              No-   productive cough,  No non-productive cough,  No-  coughing up of blood.              No-   change in color of mucus.  No- wheezing.   Skin: No-   rash or lesions. GI:  No-   heartburn, indigestion, abdominal pain, nausea, vomiting,  GU:  MS:  No-   joint pain or swelling.   Neuro- nothing unusual Psych:  No- change in mood or affect. No depression or anxiety.  No memory loss. Ior Objective:   Physical Exam General- Alert, Oriented, Affect-appropriate,  Distress- none acute  Slender cheerful Skin- rash-none, lesions- none, excoriation- none Lymphadenopathy- none Head- atraumatic            Eyes- Gross vision -limited to light and dark,  conjunctivae clear secretions            Ears- Hearing, canals normal                          Nose- Clear, No-Septal dev, mucus, polyps, erosion, perforation             Throat- Mallampati II , mucosa clear , drainage- none, tonsils- atrophic Neck- flexible , trachea midline, no stridor , thyroid nl, carotid no bruit Chest - symmetrical excursion , unlabored           Heart/CV- nearly RR , no murmur , no gallop  , no rub, nl s1 s2                           - JVD- none , edema- none, stasis changes- none, varices- none           Lung- clear to P&A, wheeze- none, cough- none , dullness-none, rub- none           Chest wall-  Abd-  Br/ Gen/ Rectal- Not done, not indicated Extrem- cyanosis- none, clubbing, none, atrophy- none, strength- nl Neuro- grossly intact to observation

## 2015-01-19 NOTE — Assessment & Plan Note (Signed)
Mild intermittent well-controlled. Okay now as pollen season fades, to try reducing Symbicort dose again since that worked well much of last year.

## 2015-01-19 NOTE — Patient Instructions (Addendum)
Sample Dymista nasal spray   Try this instead of Flonase for comparison              1-2 puffs each nostril once daily at bedtime  Script printed for Astelin/ azelastine nasal spray (antihistamine). You can fill this if you liked the Dymista. It is the more active component of Dymista.   Ok to try reducing Symbicort back to 1 puff, twice daily when you feel ready.   Please call if we can help

## 2015-01-19 NOTE — Assessment & Plan Note (Signed)
Watery rhinorrhea not responding well to Flonase or antihistamine suggest at her age this may be a vasomotor rhinitis. I am not clear from talking to them, whether she has some glaucoma so I don't want to start ipratropium nasal spray yet. Plan-try sample Dymista nasal spray. If that works well, then Astelin nasal spray may be cheaper.

## 2015-02-15 DIAGNOSIS — Z6822 Body mass index (BMI) 22.0-22.9, adult: Secondary | ICD-10-CM | POA: Diagnosis not present

## 2015-02-15 DIAGNOSIS — R05 Cough: Secondary | ICD-10-CM | POA: Diagnosis not present

## 2015-02-15 DIAGNOSIS — J45909 Unspecified asthma, uncomplicated: Secondary | ICD-10-CM | POA: Diagnosis not present

## 2015-02-15 DIAGNOSIS — J309 Allergic rhinitis, unspecified: Secondary | ICD-10-CM | POA: Diagnosis not present

## 2015-02-16 ENCOUNTER — Telehealth: Payer: Self-pay | Admitting: Internal Medicine

## 2015-02-16 MED ORDER — ALBUTEROL SULFATE HFA 108 (90 BASE) MCG/ACT IN AERS: 2.0000 | INHALATION_SPRAY | Freq: Four times a day (QID) | RESPIRATORY_TRACT | Status: DC | PRN

## 2015-02-16 NOTE — Telephone Encounter (Signed)
Called spoke with spouse. Pt needs refill on proair. I have sent this in. Nothing further needed

## 2015-02-18 ENCOUNTER — Other Ambulatory Visit: Payer: Self-pay | Admitting: Internal Medicine

## 2015-02-18 MED ORDER — ALBUTEROL SULFATE HFA 108 (90 BASE) MCG/ACT IN AERS: 2.0000 | INHALATION_SPRAY | Freq: Four times a day (QID) | RESPIRATORY_TRACT | Status: DC | PRN

## 2015-02-18 NOTE — Telephone Encounter (Signed)
Received paper refill request from [facility] for Proair HFA 30mg inhaler, 2 puffs by mouth every 6 hours if needed for wheezing and shortness of breath.   Per CY approved with 11 refills.   Electronically faxed

## 2015-03-15 DIAGNOSIS — Z1212 Encounter for screening for malignant neoplasm of rectum: Secondary | ICD-10-CM | POA: Diagnosis not present

## 2015-03-18 ENCOUNTER — Other Ambulatory Visit: Payer: Self-pay | Admitting: Internal Medicine

## 2015-03-18 MED ORDER — BUDESONIDE-FORMOTEROL FUMARATE 160-4.5 MCG/ACT IN AERO: 2.0000 | INHALATION_SPRAY | Freq: Two times a day (BID) | RESPIRATORY_TRACT | Status: DC

## 2015-03-25 ENCOUNTER — Telehealth: Payer: Self-pay | Admitting: Obstetrics & Gynecology

## 2015-03-25 NOTE — Telephone Encounter (Signed)
Spoke with patient's husband Dr.Violet, okay per ROI. Advised patient's last mammogram was performed on 05/08/2013 at [facility]. Advised she is due for mammogram at this time. Husband is agreeable and states they will call to schedule.  Routing to provider for final review. Patient agreeable to disposition. Will close encounter.   Patient aware provider will review message and nurse will return call if any additional advice or change of disposition.

## 2015-03-25 NOTE — Telephone Encounter (Signed)
Patient's husband is calling to find out when his wife had her last mammogram. She received a call saying she needed it done. [name] wants to know if she needs to have this done.

## 2015-04-08 DIAGNOSIS — Z1231 Encounter for screening mammogram for malignant neoplasm of breast: Secondary | ICD-10-CM | POA: Diagnosis not present

## 2015-04-26 ENCOUNTER — Encounter: Payer: Self-pay | Admitting: Cardiology

## 2015-04-30 ENCOUNTER — Ambulatory Visit (INDEPENDENT_AMBULATORY_CARE_PROVIDER_SITE_OTHER): Payer: Medicare Other | Admitting: Obstetrics & Gynecology

## 2015-04-30 ENCOUNTER — Encounter: Payer: Self-pay | Admitting: Obstetrics & Gynecology

## 2015-04-30 VITALS — BP 136/82 | HR 72 | Resp 25 | Ht 63.0 in | Wt 129.0 lb

## 2015-04-30 DIAGNOSIS — Z01419 Encounter for gynecological examination (general) (routine) without abnormal findings: Secondary | ICD-10-CM | POA: Diagnosis not present

## 2015-04-30 DIAGNOSIS — Z124 Encounter for screening for malignant neoplasm of cervix: Secondary | ICD-10-CM | POA: Diagnosis not present

## 2015-04-30 NOTE — Progress Notes (Signed)
81 y.o. G2P2 MarriedCaucasianF here for annual exam.  No vaginal bleeding.  Reports she continues to feel well.  Reviewed notes from Dr. Ellyn Hack and Dr. Annamaria Boots.  Suggestion made to stop statin.  Pt still on it.  Pt cannot remember that this recommendation was ever made.  PCP:  Dr. Joylene Draft.  Sees him once yearly, typically.    Patient's last menstrual period was 08/29/1967.          Sexually active: No.  The current method of family planning is status post hysterectomy.    Exercising: Yes.    walking Smoker:  Former smoker   Health Maintenance: Pap:  12/20/01 WNL  History of abnormal Pap:  no MMG:  04/08/15 3D-BiRads 1-negative Colonoscopy:  2012 BMD:   12/05/11 Dr Joylene Draft TDaP:  UTD Screening Labs: PCP, Hb today: PCP, Urine today: PCP   reports that she has quit smoking. She has never used smokeless tobacco. She reports that she drinks alcohol. She reports that she does not use illicit drugs.  Past Medical History  Diagnosis Date  . Allergic rhinitis   . Unspecified asthma(493.90)   . Personal history of other diseases of circulatory system   . Personal history of venous thrombosis and embolism 4/94  . Other and unspecified hyperlipidemia   . Subjective visual disturbance, unspecified   . Other retinal disorders(362.89)   . Atrial fibrillation   . Osteopenia   . Hyperlipidemia   . Dyspnea on exertion   . Vitamin D deficiency   . Reactive airway disease   . 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 Breast biopsy    . Appendectomy    . Total abdominal hysterectomy    . Breast biopsy      bilateral  . Rotator cuff repair  7/04, 2003    right, left  . Carpal tunnel release  2006    right  . Perineoplasty      and rectocele  . Transthoracic echocardiogram  February 2011    EF 55-60%. Grade 1 diastolic dysfunction/relaxation abnormality. Mild MR  . Cardiovascular  stress test  August 2013    CPET-MET: Submaximal effort, did not reach anaerobic threshold --> peak VO2 76% (not overly concerning for CAD)  . Pulmonary function test  August 2013    CPET-PFTs: FVC 58%, FEV1 48%; breathing reserve less than 10%, normal DLCO; reduced vital capacity -- suggesting obstructive lung disease    Current Outpatient Prescriptions  Medication Sig Dispense Refill  . acetaminophen (TYLENOL) 325 MG tablet Take 650 mg by mouth every 6 (six) hours as needed.    Marland Kitchen albuterol (PROAIR HFA) 108 (90 BASE) MCG/ACT inhaler Inhale 2 puffs into the lungs every 6 (six) hours as needed for wheezing or shortness of breath. 1 Inhaler 11  . azelastine (ASTELIN) 0.1 % nasal spray 1-2 puffs each nostril once or twice daily as needed for runny nose 30 mL 12  . Azelastine-Fluticasone (DYMISTA) 137-50 MCG/ACT SUSP Place 2 sprays into both nostrils at bedtime. 1 Bottle 0  . budesonide-formoterol (SYMBICORT) 160-4.5 MCG/ACT inhaler Inhale 2 puffs into the lungs 2 (two) times daily. Rinse mouth 10.2 g 11  . Coenzyme Q10 (COQ10) 100 MG CAPS Take 1 capsule by mouth once.     . digoxin (LANOXIN) 0.125 MG tablet Take 125 mcg by mouth daily.      . dorzolamide-timolol (COSOPT) 22.3-6.8 MG/ML  ophthalmic solution Place 1 drop into both eyes 2 (two) times daily.      . ergocalciferol (VITAMIN D2) 50000 UNITS capsule Take 50,000 Units by mouth once a week.      . fluticasone (FLONASE) 50 MCG/ACT nasal spray Place 2 sprays into the nose as needed.     . loratadine (CLARITIN) 10 MG tablet Take 10 mg by mouth daily.    . Multiple Vitamins-Minerals (PRESERVISION/LUTEIN) CAPS Take 1 capsule by mouth 2 (two) times daily.      . simvastatin (ZOCOR) 40 MG tablet Take 40 mg by mouth daily.      . vitamin B-12 (CYANOCOBALAMIN) 100 MCG tablet Take 2,000 mcg by mouth daily.      No current facility-administered medications for this visit.    Family History  Problem Relation Age of Onset  . Diabetes Paternal Aunt    . Osteoporosis Mother     ROS:  Pertinent items are noted in HPI.  Otherwise, a comprehensive ROS was negative.  Exam:   BP 136/82 mmHg  Pulse 72  Resp 25  Ht _0  (1.6 m)  Wt 129 lb (58.514 kg)  BMI 22.86 kg/m2  LMP 08/29/1967  Weight change: -2#  Height: _1  (160 cm)  Ht Readings from Last 3 Encounters:  04/30/15 _2  (1.6 m)  01/19/15 _3  (1.626 m)  11/12/14 _4  (1.575 m)    General appearance: alert, cooperative and appears stated age Head: Normocephalic, without obvious abnormality, atraumatic Neck: no adenopathy, supple, symmetrical, trachea midline and thyroid normal to inspection and palpation Lungs: clear to auscultation bilaterally Breasts: normal appearance, no masses or tenderness Heart: regular rate and rhythm Abdomen: soft, non-tender; bowel sounds normal; no masses,  no organomegaly Extremities: extremities normal, atraumatic, no cyanosis or edema Skin: Skin color, texture, turgor normal. No rashes or lesions Lymph nodes: Cervical, supraclavicular, and axillary nodes normal. No abnormal inguinal nodes palpated Neurologic: Grossly normal   Pelvic: External genitalia:  no lesions              Urethra:  normal appearing urethra with no masses, tenderness or lesions              Bartholins and Skenes: normal                 Vagina: normal appearing vagina with normal color and discharge, no lesions, atrophic changes noted              Cervix: absent              Pap taken: No. Bimanual Exam:  Uterus:  normal size, contour, position, consistency, mobility, non-tender              Adnexa: normal adnexa and no mass, fullness, tenderness               Rectovaginal: Confirms               Anus:  normal sphincter tone, no lesions  Chaperone was present for exam.  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 Joylene Draft yearly See Dr. Annamaria Boots, pulmonology, every six months and Dr. Ellyn Hack return annually or prn

## 2015-05-14 DIAGNOSIS — H4011X4 Primary open-angle glaucoma, indeterminate stage: Secondary | ICD-10-CM | POA: Diagnosis not present

## 2015-05-14 DIAGNOSIS — H3531 Nonexudative age-related macular degeneration: Secondary | ICD-10-CM | POA: Diagnosis not present

## 2015-05-14 DIAGNOSIS — Z961 Presence of intraocular lens: Secondary | ICD-10-CM | POA: Diagnosis not present

## 2015-07-02 DIAGNOSIS — Z23 Encounter for immunization: Secondary | ICD-10-CM | POA: Diagnosis not present

## 2015-07-29 ENCOUNTER — Emergency Department (HOSPITAL_COMMUNITY): Payer: Medicare Other

## 2015-07-29 ENCOUNTER — Emergency Department (HOSPITAL_COMMUNITY)
Admission: EM | Admit: 2015-07-29 | Discharge: 2015-07-29 | Disposition: A | Discharge status: Home / Self Care | Payer: Medicare Other | Attending: Emergency Medicine | Admitting: Emergency Medicine

## 2015-07-29 ENCOUNTER — Encounter (HOSPITAL_COMMUNITY): Payer: Self-pay | Admitting: Emergency Medicine

## 2015-07-29 DIAGNOSIS — Y9389 Activity, other specified: Secondary | ICD-10-CM | POA: Insufficient documentation

## 2015-07-29 DIAGNOSIS — Y998 Other external cause status: Secondary | ICD-10-CM | POA: Diagnosis not present

## 2015-07-29 DIAGNOSIS — Z86718 Personal history of other venous thrombosis and embolism: Secondary | ICD-10-CM | POA: Insufficient documentation

## 2015-07-29 DIAGNOSIS — Z8669 Personal history of other diseases of the nervous system and sense organs: Secondary | ICD-10-CM | POA: Diagnosis not present

## 2015-07-29 DIAGNOSIS — Z7951 Long term (current) use of inhaled steroids: Secondary | ICD-10-CM | POA: Insufficient documentation

## 2015-07-29 DIAGNOSIS — S0181XA Laceration without foreign body of other part of head, initial encounter: Secondary | ICD-10-CM

## 2015-07-29 DIAGNOSIS — S7002XA Contusion of left hip, initial encounter: Secondary | ICD-10-CM | POA: Diagnosis not present

## 2015-07-29 DIAGNOSIS — E785 Hyperlipidemia, unspecified: Secondary | ICD-10-CM | POA: Diagnosis not present

## 2015-07-29 DIAGNOSIS — Z87891 Personal history of nicotine dependence: Secondary | ICD-10-CM | POA: Diagnosis not present

## 2015-07-29 DIAGNOSIS — J45909 Unspecified asthma, uncomplicated: Secondary | ICD-10-CM | POA: Diagnosis not present

## 2015-07-29 DIAGNOSIS — Z8619 Personal history of other infectious and parasitic diseases: Secondary | ICD-10-CM | POA: Insufficient documentation

## 2015-07-29 DIAGNOSIS — W19XXXA Unspecified fall, initial encounter: Secondary | ICD-10-CM

## 2015-07-29 DIAGNOSIS — Y92000 Kitchen of unspecified non-institutional (private) residence as  the place of occurrence of the external cause: Secondary | ICD-10-CM | POA: Insufficient documentation

## 2015-07-29 DIAGNOSIS — M25561 Pain in right knee: Secondary | ICD-10-CM | POA: Diagnosis not present

## 2015-07-29 DIAGNOSIS — Z79899 Other long term (current) drug therapy: Secondary | ICD-10-CM | POA: Insufficient documentation

## 2015-07-29 DIAGNOSIS — M25562 Pain in left knee: Secondary | ICD-10-CM | POA: Diagnosis not present

## 2015-07-29 DIAGNOSIS — I4891 Unspecified atrial fibrillation: Secondary | ICD-10-CM | POA: Insufficient documentation

## 2015-07-29 DIAGNOSIS — S8002XA Contusion of left knee, initial encounter: Secondary | ICD-10-CM | POA: Insufficient documentation

## 2015-07-29 DIAGNOSIS — W01198A Fall on same level from slipping, tripping and stumbling with subsequent striking against other object, initial encounter: Secondary | ICD-10-CM | POA: Insufficient documentation

## 2015-07-29 DIAGNOSIS — E559 Vitamin D deficiency, unspecified: Secondary | ICD-10-CM | POA: Insufficient documentation

## 2015-07-29 DIAGNOSIS — S0101XA Laceration without foreign body of scalp, initial encounter: Secondary | ICD-10-CM | POA: Diagnosis not present

## 2015-07-29 DIAGNOSIS — M25552 Pain in left hip: Secondary | ICD-10-CM | POA: Diagnosis not present

## 2015-07-29 DIAGNOSIS — T148 Other injury of unspecified body region: Secondary | ICD-10-CM | POA: Diagnosis not present

## 2015-07-29 DIAGNOSIS — S0990XA Unspecified injury of head, initial encounter: Secondary | ICD-10-CM | POA: Diagnosis present

## 2015-07-29 MED ORDER — LIDOCAINE-EPINEPHRINE (PF) 2 %-1:200000 IJ SOLN
10.0000 mL | Freq: Once | INTRAMUSCULAR | Status: AC
  Administered 2015-07-29: 10 mL
  Filled 2015-07-29: qty 20

## 2015-07-29 NOTE — ED Notes (Signed)
The patient's family called EMS to her home due to the patient falling in her kitchen.  She said she was trying to "step on a bug" and lost her balance and fell.  She hit her head on a chair causing a laceration to the right side of her forehead.  She is also complaining of knee pain.  She is not on blood thinners, she denies LOC, and she denies any other pain.  She is a GCS=15.

## 2015-07-29 NOTE — ED Notes (Signed)
Patient is alert and orientedx4.  Patient was explained discharge instructions and they understood them with no questions.  The patient's daughter in law, Domique Reardon is taking the patient home.

## 2015-07-29 NOTE — ED Notes (Signed)
Patient transported to CT 

## 2015-07-29 NOTE — ED Notes (Signed)
Family at bedside. 

## 2015-07-29 NOTE — Discharge Instructions (Signed)
Suture out next Friday. Recommend returning to the emergency department early in the day for suture removal. Keep dressing on for 2 full days. Then can change daily. Try to keep it dry. All your x-rays were good.

## 2015-07-29 NOTE — ED Provider Notes (Signed)
CSN: [id number]     Arrival date & time 07/29/15  1533 History   First MD Initiated Contact with Patient 07/29/15 1600     Chief Complaint  Patient presents with  . Fall    The patient's family called EMS to her home due to the patient falling in her kitchen.  She said she was trying to "step on a bug" and lost her balance and fell.  She hit her head on a chair causing a laceration to the right side of her forehead.  She is also complaining of knee pain.     (Consider location/radiation/quality/duration/timing/severity/associated sxs/prior Treatment) HPI..... Level V caveat for urgent intervention. Accidental mechanical fall today striking her right forehead on the edge of a chair. She now has a laceration in the same area. She also complains of left knee pain. No true syncope or neurological deficits.  Past Medical History  Diagnosis Date  . Allergic rhinitis   . Unspecified asthma(493.90)   . Personal history of other diseases of circulatory system   . Personal history of venous thrombosis and embolism 4/94  . Other and unspecified hyperlipidemia   . Subjective visual disturbance, unspecified   . Other retinal disorders(362.89)   . Atrial fibrillation ([place])   . Osteopenia   . Hyperlipidemia   . Dyspnea on exertion   . Vitamin D deficiency   . Reactive airway disease   . 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 Breast biopsy    . Appendectomy    . Total abdominal hysterectomy    . Breast biopsy      bilateral  . Rotator cuff repair  7/04, 2003    right, left  . Carpal tunnel release  2006    right  . Perineoplasty      and rectocele  . Transthoracic echocardiogram  February 2011    EF 55-60%. Grade 1 diastolic dysfunction/relaxation abnormality. Mild MR  . Cardiovascular stress test  August 2013    CPET-MET: Submaximal effort, did not reach anaerobic threshold  --> peak VO2 76% (not overly concerning for CAD)  . Pulmonary function test  August 2013    CPET-PFTs: FVC 58%, FEV1 48%; breathing reserve less than 10%, normal DLCO; reduced vital capacity -- suggesting obstructive lung disease   Family History  Problem Relation Age of Onset  . Diabetes Paternal Aunt   . Osteoporosis Mother    Social History  Substance Use Topics  . Smoking status: Former Research scientist (life sciences)  . Smokeless tobacco: Never Used     Comment: quit 17 years ago  . Alcohol Use: Yes     Comment: high ball prior to dinner   OB History    Gravida Para Term Preterm AB TAB SAB Ectopic Multiple Living   _0 Review of Systems  Reason unable to perform ROS: urgent need for intervention.      Allergies  Cefaclor and Codeine  Home Medications   Prior to Admission medications   Medication Sig Start Date End Date Taking? Authorizing Provider  acetaminophen (TYLENOL) 325 MG tablet Take 650 mg by mouth every 6 (six) hours as needed for mild pain.    Yes Historical Provider, MD  albuterol (PROAIR HFA) 108 (90 BASE) MCG/ACT inhaler Inhale 2 puffs into the lungs every 6 (six) hours as needed for wheezing  or shortness of breath. 02/18/15 10/21/17 Yes Deneise Lever, MD  budesonide-formoterol [facility]) 160-4.5 MCG/ACT inhaler Inhale 2 puffs into the lungs 2 (two) times daily. Rinse mouth 03/18/15 04/01/16 Yes Deneise Lever, MD  Coenzyme Q10 (COQ10) 100 MG CAPS Take 1 capsule by mouth once.    Yes Historical Provider, MD  digoxin (LANOXIN) 0.125 MG tablet Take 125 mcg by mouth daily.     Yes Historical Provider, MD  dorzolamide-timolol (COSOPT) 22.3-6.8 MG/ML ophthalmic solution Place 1 drop into both eyes 2 (two) times daily.     Yes Historical Provider, MD  ergocalciferol (VITAMIN D2) 50000 UNITS capsule Take 50,000 Units by mouth once a week. Take on Monday mornings   Yes Historical Provider, MD  loratadine (CLARITIN) 10 MG tablet Take 10 mg by mouth daily.   Yes Historical  Provider, MD  Multiple Vitamins-Minerals (PRESERVISION/LUTEIN) CAPS Take 1 capsule by mouth 2 (two) times daily.     Yes Historical Provider, MD  simvastatin (ZOCOR) 40 MG tablet Take 40 mg by mouth daily.     Yes Historical Provider, MD  vitamin B-12 (CYANOCOBALAMIN) 100 MCG tablet Take 2,000 mcg by mouth daily.    Yes Historical Provider, MD  azelastine (ASTELIN) 0.1 % nasal spray 1-2 puffs each nostril once or twice daily as needed for runny nose Patient not taking: Reported on 07/29/2015 01/19/15   Deneise Lever, MD  Azelastine-Fluticasone [facility]) 137-50 MCG/ACT SUSP Place 2 sprays into both nostrils at bedtime. Patient not taking: Reported on 07/29/2015 01/19/15   Deneise Lever, MD   BP 113/91 mmHg  Pulse 95  Temp(Src) 98.1 F (36.7 C) (Axillary)  Resp 16  SpO2 97%  LMP 08/29/1967 Physical Exam  Constitutional: She is oriented to person, place, and time. She appears well-developed and well-nourished.  HENT:  Head: Normocephalic.  2.75 cm oblique laceration on right forehead  Eyes: Conjunctivae and EOM are normal. Pupils are equal, round, and reactive to light.  Neck: Normal range of motion. Neck supple.  Cardiovascular: Normal rate and regular rhythm.   Pulmonary/Chest: Effort normal and breath sounds normal.  Abdominal: Soft. Bowel sounds are normal.  Musculoskeletal: Normal range of motion.  Neurological: She is alert and oriented to person, place, and time.  Skin: Skin is warm and dry.  Psychiatric: She has a normal mood and affect. Her behavior is normal.  Nursing note and vitals reviewed.   ED Course  .Marland KitchenLaceration Repair Date/Time: 07/29/2015 5:00 PM Performed by: Nat Christen Authorized by: Nat Christen Consent: Verbal consent obtained. Risks and benefits: risks, benefits and alternatives were discussed Consent given by: patient Comments: 2.75 cm laceration: Anesthesia with 2% Xylocaine with epinephrine 1:200,0007 mL.  Wound cleaned with normal saline. 5-0 Prolene  9 sutures, interrupted. Sterile dressing.   (including critical care time) Labs Review Labs Reviewed - No data to display  Imaging Review Ct Head Wo Contrast  07/29/2015  CLINICAL DATA:  Fall and kitchen. Hit head on chair with laceration to the right side of the forehead. No loss of consciousness. EXAM: CT HEAD WITHOUT CONTRAST TECHNIQUE: Contiguous axial images were obtained from the base of the skull through the vertex without intravenous contrast. COMPARISON:  None. FINDINGS: Mild atrophy and white matter disease is present. Acute infarct, hemorrhage, or mass lesion is present. Remote ischemic changes are present in the thalami bilaterally the basal ganglia are otherwise intact. The insular ribbon is intact bilaterally. A remote lacunar infarct is present in the right cerebellum. The ventricles are proportionate to  the degree of atrophy. No significant extra-axial fluid collection is present. A right supraorbital scalp laceration is present. There is no underlying fracture. The calvarium is intact. Paranasal sinuses and mastoid air cells are clear. Vascular calcifications are present at the cavernous internal carotid arteries and at the dural margin of the right vertebral artery. IMPRESSION: 1. No acute intracranial abnormality. 2. Right frontal scalp laceration without underlying fracture. 3. Mild atrophy and white matter disease likely reflects the sequela of chronic microvascular ischemia. 4. Remote lacunar infarct of the right cerebellum. 5. Remote ischemic changes of the failed my bilaterally. 6. Atherosclerosis. Electronically Signed   By: San Morelle M.D.   On: 07/29/2015 17:50   Dg Knee Complete 4 Views Left  07/29/2015  CLINICAL DATA:  [place] today and injured left knee and left hip. EXAM: LEFT KNEE - COMPLETE 4+ VIEW; DG HIP (WITH OR WITHOUT PELVIS) 2-3V LEFT COMPARISON:  None. FINDINGS: Left hip: Both hips are normally located. Moderate degenerative changes bilaterally. No acute hip  fracture. The pubic symphysis and SI joints are intact. No definite pelvic fractures. Left knee: Mild to moderate tricompartmental degenerative changes but no acute fracture or osteochondral abnormality. Vascular calcifications are noted. Anterior soft tissue swelling could reflect a subcutaneous hematoma. IMPRESSION: No acute fracture. Electronically Signed   By: Marijo Sanes M.D.   On: 07/29/2015 17:24   Dg Hip Unilat With Pelvis 2-3 Views Left  07/29/2015  CLINICAL DATA:  [place] [address] today and injured left knee and left hip. EXAM: LEFT KNEE - COMPLETE 4+ VIEW; DG HIP (WITH OR WITHOUT PELVIS) 2-3V LEFT COMPARISON:  None. FINDINGS: Left hip: Both hips are normally located. Moderate degenerative changes bilaterally. No acute hip fracture. The pubic symphysis and SI joints are intact. No definite pelvic fractures. Left knee: Mild to moderate tricompartmental degenerative changes but no acute fracture or osteochondral abnormality. Vascular calcifications are noted. Anterior soft tissue swelling could reflect a subcutaneous hematoma. IMPRESSION: No acute fracture. Electronically Signed   By: Marijo Sanes M.D.   On: 07/29/2015 17:24   I have personally reviewed and evaluated these images and lab results as part of my medical decision-making.   EKG Interpretation None      MDM   Final diagnoses:  Fall, initial encounter  Forehead laceration, initial encounter  Contusion of left knee, initial encounter  Contusion of left hip, initial encounter    Family confirms the patient is neurologically intact. Laceration repair as above. CT head, plain films of left knee and left hip show no fracture. All these findings were discussed with the patient and her family.    Nat Christen, MD 07/29/15 [phone]

## 2015-08-06 ENCOUNTER — Emergency Department (HOSPITAL_COMMUNITY)
Admission: EM | Admit: 2015-08-06 | Discharge: 2015-08-06 | Disposition: A | Discharge status: Home / Self Care | Payer: Medicare Other | Attending: Emergency Medicine | Admitting: Emergency Medicine

## 2015-08-06 ENCOUNTER — Encounter (HOSPITAL_COMMUNITY): Payer: Self-pay | Admitting: Emergency Medicine

## 2015-08-06 DIAGNOSIS — I4891 Unspecified atrial fibrillation: Secondary | ICD-10-CM | POA: Diagnosis not present

## 2015-08-06 DIAGNOSIS — R51 Headache: Secondary | ICD-10-CM | POA: Insufficient documentation

## 2015-08-06 DIAGNOSIS — Z8669 Personal history of other diseases of the nervous system and sense organs: Secondary | ICD-10-CM | POA: Diagnosis not present

## 2015-08-06 DIAGNOSIS — Z8619 Personal history of other infectious and parasitic diseases: Secondary | ICD-10-CM | POA: Insufficient documentation

## 2015-08-06 DIAGNOSIS — Z86718 Personal history of other venous thrombosis and embolism: Secondary | ICD-10-CM | POA: Diagnosis not present

## 2015-08-06 DIAGNOSIS — J45909 Unspecified asthma, uncomplicated: Secondary | ICD-10-CM | POA: Diagnosis not present

## 2015-08-06 DIAGNOSIS — E559 Vitamin D deficiency, unspecified: Secondary | ICD-10-CM | POA: Insufficient documentation

## 2015-08-06 DIAGNOSIS — E785 Hyperlipidemia, unspecified: Secondary | ICD-10-CM | POA: Insufficient documentation

## 2015-08-06 DIAGNOSIS — Z79899 Other long term (current) drug therapy: Secondary | ICD-10-CM | POA: Insufficient documentation

## 2015-08-06 DIAGNOSIS — Z4802 Encounter for removal of sutures: Secondary | ICD-10-CM | POA: Insufficient documentation

## 2015-08-06 DIAGNOSIS — Z7951 Long term (current) use of inhaled steroids: Secondary | ICD-10-CM | POA: Insufficient documentation

## 2015-08-06 DIAGNOSIS — M8580 Other specified disorders of bone density and structure, unspecified site: Secondary | ICD-10-CM | POA: Diagnosis not present

## 2015-08-06 NOTE — ED Notes (Signed)
Suture intact right forehead.

## 2015-08-06 NOTE — ED Provider Notes (Signed)
CSN: [id number]     Arrival date & time 08/06/15  7893 History  By signing my name below, I, Meriel Pica, attest that this documentation has been prepared under the direction and in the presence of Domenic Moras, PA-C.  Electronically Signed: Meriel Pica, ED Scribe. 08/06/2015. 10:06 AM.   Chief Complaint  Patient presents with  . Suture / Staple Removal   Patient is a 81 y.o. female presenting with suture removal. The history is provided by the patient. No language interpreter was used.  Suture / Staple Removal This is a new problem. The current episode started more than 1 week ago. The problem occurs constantly. The problem has been gradually improving. Associated symptoms include headaches. Exacerbated by: palpation. Relieved by: sutures. Treatments tried: sutures. The treatment provided significant relief.   HPI Comments: Jodi Burns is a 81 y.o. female who presents to the Emergency Department for suture removal from right forehead. Pt was seen in the ED 8 days ago following a mechanical fall when she received 9, 5-0 prolene sutures to right forehead. Pt reports an associated headache and soreness to the affected area but notes no other complaints and states the area has significantly improved. Pt has close follow up with Dr. Joylene Draft. She had a brief episode of Afib over 15 years and is not on blood thinning medication.   Past Medical History  Diagnosis Date  . Allergic rhinitis   . Unspecified asthma(493.90)   . Personal history of other diseases of circulatory system   . Personal history of venous thrombosis and embolism 4/94  . Other and unspecified hyperlipidemia   . Subjective visual disturbance, unspecified   . Other retinal disorders(362.89)   . Atrial fibrillation ([place])   . Osteopenia   . Hyperlipidemia   . Dyspnea on exertion   . Vitamin D deficiency   . Reactive airway disease   . 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 Breast biopsy    . Appendectomy    . Total abdominal hysterectomy    . Breast biopsy      bilateral  . Rotator cuff repair  7/04, 2003    right, left  . Carpal tunnel release  2006    right  . Perineoplasty      and rectocele  . Transthoracic echocardiogram  February 2011    EF 55-60%. Grade 1 diastolic dysfunction/relaxation abnormality. Mild MR  . Cardiovascular stress test  August 2013    CPET-MET: Submaximal effort, did not reach anaerobic threshold --> peak VO2 76% (not overly concerning for CAD)  . Pulmonary function test  August 2013    CPET-PFTs: FVC 58%, FEV1 48%; breathing reserve less than 10%, normal DLCO; reduced vital capacity -- suggesting obstructive lung disease   Family History  Problem Relation Age of Onset  . Diabetes Paternal Aunt   . Osteoporosis Mother    Social History  Substance Use Topics  . Smoking status: Former Research scientist (life sciences)  . Smokeless tobacco: Never Used     Comment: quit 17 years ago  . Alcohol Use: Yes     Comment: high ball prior to dinner   OB History    Gravida Para Term Preterm AB TAB SAB Ectopic Multiple Living   '2 2        2     ' Review of Systems  Constitutional: Negative for fever.  Skin: Positive  for wound ( well healing wound with sutures in place to right forehead).  Neurological: Positive for headaches.   Allergies  Cefaclor and Codeine  Home Medications   Prior to Admission medications   Medication Sig Start Date End Date Taking? Authorizing Provider  acetaminophen (TYLENOL) 325 MG tablet Take 650 mg by mouth every 6 (six) hours as needed for mild pain.     Historical Provider, MD  albuterol (PROAIR HFA) 108 (90 BASE) MCG/ACT inhaler Inhale 2 puffs into the lungs every 6 (six) hours as needed for wheezing or shortness of breath. 02/18/15 10/21/17  Deneise Lever, MD  azelastine (ASTELIN) 0.1 % nasal spray 1-2 puffs each nostril once or twice daily as needed  for runny nose Patient not taking: Reported on 07/29/2015 01/19/15   Deneise Lever, MD  Azelastine-Fluticasone (DYMISTA) 137-50 MCG/ACT SUSP Place 2 sprays into both nostrils at bedtime. Patient not taking: Reported on 07/29/2015 01/19/15   Deneise Lever, MD  budesonide-formoterol [facility]) 160-4.5 MCG/ACT inhaler Inhale 2 puffs into the lungs 2 (two) times daily. Rinse mouth 03/18/15 04/01/16  Deneise Lever, MD  Coenzyme Q10 (COQ10) 100 MG CAPS Take 1 capsule by mouth once.     Historical Provider, MD  digoxin (LANOXIN) 0.125 MG tablet Take 125 mcg by mouth daily.      Historical Provider, MD  dorzolamide-timolol (COSOPT) 22.3-6.8 MG/ML ophthalmic solution Place 1 drop into both eyes 2 (two) times daily.      Historical Provider, MD  ergocalciferol (VITAMIN D2) 50000 UNITS capsule Take 50,000 Units by mouth once a week. Take on Monday mornings    Historical Provider, MD  loratadine (CLARITIN) 10 MG tablet Take 10 mg by mouth daily.    Historical Provider, MD  Multiple Vitamins-Minerals (PRESERVISION/LUTEIN) CAPS Take 1 capsule by mouth 2 (two) times daily.      Historical Provider, MD  simvastatin (ZOCOR) 40 MG tablet Take 40 mg by mouth daily.      Historical Provider, MD  vitamin B-12 (CYANOCOBALAMIN) 100 MCG tablet Take 2,000 mcg by mouth daily.     Historical Provider, MD   BP 148/87 mmHg  Pulse 86  Resp 16  SpO2 100%  LMP 08/29/1967 Physical Exam  Constitutional: She is oriented to person, place, and time. She appears well-developed and well-nourished. No distress.  HENT:  Head: Normocephalic.  Well healing oblique sutures to right forehead, no signs of infection, mildly TTP.  Eyes: Conjunctivae are normal.  Neck: Normal range of motion. Neck supple.  Cardiovascular: Normal rate.   Pulmonary/Chest: Effort normal. No respiratory distress.  Musculoskeletal: Normal range of motion.  Neurological: She is alert and oriented to person, place, and time. Coordination normal.  Skin: Skin  is warm.  Psychiatric: She has a normal mood and affect. Her behavior is normal.  Nursing note and vitals reviewed.   ED Course  Procedures  DIAGNOSTIC STUDIES: Oxygen Saturation is 100% on RA, normal by my interpretation.    COORDINATION OF CARE: 10:05 AM Discussed treatment plan which includes to remove sutures with pt. Pt acknowledges and agrees to plan.   MDM   Patient presents for suture removal. The wound is well healed without signs of infection.  The sutures are removed. Wound care and activity instructions given. Return prn.   SUTURE REMOVAL Performed by: Domenic Moras  Consent: Verbal consent obtained. Patient identity confirmed: provided demographic data Time out: Immediately prior to procedure a "time out" was called to verify the correct patient, procedure, equipment, support staff  and site/side marked as required.  Location details: R forehead  Wound Appearance: clean  Sutures/Staples Removed: sutures  Facility: sutures placed in this facility Patient tolerance: Patient tolerated the procedure well with no immediate complications.     Final diagnoses:  Visit for suture removal    BP 148/87 mmHg  Pulse 86  Resp 16  SpO2 100%  LMP 08/29/1967   I personally performed the services described in this documentation, which was scribed in my presence. The recorded information has been reviewed and is accurate.      Domenic Moras, PA-C 08/06/15 1008  Harvel Quale, MD 08/07/15 2103

## 2015-08-06 NOTE — ED Notes (Signed)
Sutures removed by PA

## 2015-08-06 NOTE — Discharge Instructions (Signed)

## 2015-09-27 DIAGNOSIS — R2689 Other abnormalities of gait and mobility: Secondary | ICD-10-CM | POA: Diagnosis not present

## 2015-09-27 DIAGNOSIS — R51 Headache: Secondary | ICD-10-CM | POA: Diagnosis not present

## 2015-10-11 ENCOUNTER — Encounter: Payer: Self-pay | Admitting: Rehabilitation

## 2015-10-11 ENCOUNTER — Ambulatory Visit: Payer: Medicare Other | Admitting: Rehabilitation

## 2015-10-11 ENCOUNTER — Ambulatory Visit: Payer: Medicare Other | Attending: Internal Medicine | Admitting: Rehabilitation

## 2015-10-11 DIAGNOSIS — Z789 Other specified health status: Secondary | ICD-10-CM

## 2015-10-11 DIAGNOSIS — R531 Weakness: Secondary | ICD-10-CM | POA: Insufficient documentation

## 2015-10-11 DIAGNOSIS — R2681 Unsteadiness on feet: Secondary | ICD-10-CM | POA: Insufficient documentation

## 2015-10-11 DIAGNOSIS — R2689 Other abnormalities of gait and mobility: Secondary | ICD-10-CM | POA: Insufficient documentation

## 2015-10-12 NOTE — Therapy (Signed)
[facility] [address] [place], [place], [address] Phone: [phone]   Fax:  [phone]  Physical Therapy Evaluation  Patient Details  Name: Jodi Burns MRN: [id number] Date of Birth: 06-18-1926 Referring Provider: Crist Infante, MD  Encounter Date: 10/11/2015      PT End of Session - 10/12/15 0912    Visit Number 1   Number of Visits 17   Date for PT Re-Evaluation 12/11/15   Authorization Type Medicare-G Codes every 10th visit   PT Start Time 1532   PT Stop Time 1620   PT Time Calculation (min) 48 min   Activity Tolerance Patient tolerated treatment well   Behavior During Therapy [facility] for tasks assessed/performed      Past Medical History  Diagnosis Date  . Allergic rhinitis   . Unspecified asthma(493.90)   . Personal history of other diseases of circulatory system   . Personal history of venous thrombosis and embolism 4/94  . Other and unspecified hyperlipidemia   . Subjective visual disturbance, unspecified   . Other retinal disorders(362.89)   . Atrial fibrillation ([place])   . Osteopenia   . Hyperlipidemia   . Dyspnea on exertion   . Vitamin D deficiency   . Reactive airway disease   . 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 Breast biopsy    . Appendectomy    . Total abdominal hysterectomy    . Breast biopsy      bilateral  . Rotator cuff repair  7/04, 2003    right, left  . Carpal tunnel release  2006    right  . Perineoplasty      and rectocele  . Transthoracic echocardiogram  February 2011    EF 55-60%. Grade 1 diastolic dysfunction/relaxation abnormality. Mild MR  . Cardiovascular stress test  August 2013    CPET-MET: Submaximal effort, did not reach anaerobic threshold --> peak VO2 76% (not overly concerning for CAD)  . Pulmonary function test  August 2013   CPET-PFTs: FVC 58%, FEV1 48%; breathing reserve less than 10%, normal DLCO; reduced vital capacity -- suggesting obstructive lung disease    There were no vitals filed for this visit.  Visit Diagnosis:  Unsteadiness - Plan: PT plan of care cert/re-cert  Poor balance - Plan: PT plan of care cert/re-cert  Generalized weakness - Plan: PT plan of care cert/re-cert  Poor tolerance for activity - Plan: PT plan of care cert/re-cert      Subjective Assessment - 10/11/15 1543    Subjective Per husband, she complains a lot.  "She is a terrible historian, we used to walk a lot, but now because of her knees, she doesn't want a lot.  She has fallen on her knees.  "I don't have any stamina."     Patient is accompained by: Family member   Limitations Walking   Currently in Pain? Yes   Pain Score 3    Pain Location Head   Pain Orientation Right  and top   Pain Descriptors / Indicators Dull   Pain Type Chronic pain   Pain Onset More than a month ago   Pain Frequency Intermittent   Aggravating Factors  laying on R side   Pain Relieving Factors laying on the other side  PT Education - 10/12/15 0911    Education provided Yes   Education Details Education on evaluation findings, POC, and goals.    Person(s) Educated Patient;Spouse   Methods Explanation   Comprehension Verbalized understanding          PT Short Term Goals - 10/12/15 0921    PT SHORT TERM GOAL #1   Title Pt will initiate HEP in order to indicate decreased fall risk and improved functional mobility.  (Target Date: 11/08/15)   PT SHORT TERM GOAL #2   Title Pt will improve BERG balance score to 32/56 in order to indicate decreased fall risk.     PT SHORT TERM GOAL #3   Title Pt will perform 5 Time sit to stand test <15 seconds in order to indicate improved functional strength.     PT SHORT TERM GOAL #4   Title Will assess 6MWT and improve by 72' in order to indicate  improvement in functional endurance.     PT SHORT TERM GOAL #5   Title Pt will ambulate 200' over indoor surfaces with LRAD at mod I level in order to indicate safe home negotiation.            PT Long Term Goals - 10/12/15 0925    PT LONG TERM GOAL #1   Title Pt will be independent with HEP in order to indicate decreased fall risk and improved functional mobility.  (Target Date: 12/06/15)   PT LONG TERM GOAL #2   Title Pt will improve BERG balance score to 36/56 in order to indicate decreased fall risk.      PT LONG TERM GOAL #3   Title Pt will improve 6MWT by 160' from baseline in order to indicate improved functional mobility and endurance.     PT LONG TERM GOAL #4   Title Pt will ambulate 500' over paved outdoor surfaces w/ LRAD at S level in order to indicate safe community negotiation.     PT LONG TERM GOAL #5   Title Pt/spouse will independently verbalize fall prevention strategies in order to prevent fall indoors and outside of house.                 Plan - 10/12/15 0913    Clinical Impression Statement Pt presents with gait instability and decreasing balance since fall in Dec of 2016.  She presents with decreased strength (generalized but L>R with increased pain due to past fall with tibia fracture), decreased balance and marked decrease in endurance.  Pt with past history of falls, she is legally blind, and has had history of atrial fibrillation which could impact progress in therapy.  Per PT evaluation, note that gait speed is 1.96 ft/sec, indicative of limited community ambulator, BERG balance test of 28/56, indicative of fall risk, and 5 Time sit to stand time of 16.37, also indicative of fall risk.  Feel that she would greatly benefit from skilled OP neuro to address deficits.  Pt is of moderate complexity and presentation is evolving.     Pt will benefit from skilled therapeutic intervention in order to improve on the following deficits Cardiopulmonary status limiting  activity;Abnormal gait;Decreased activity tolerance;Decreased balance;Decreased endurance;Decreased knowledge of use of DME;Decreased mobility;Decreased strength;Difficulty walking;Impaired perceived functional ability;Impaired flexibility;Impaired vision/preception;Improper body mechanics;Postural dysfunction;Dizziness   Rehab Potential Good   Clinical Impairments Affecting Rehab Potential co-morbidities and age   PT Frequency 2x / week   PT Duration 8 weeks   PT Treatment/Interventions ADLs/Self Care Home Management;DME Instruction;Gait training;Stair  training;Functional mobility training;Therapeutic activities;Therapeutic exercise;Balance training;Neuromuscular re-education;Patient/family education;Energy conservation;Vestibular   PT Next Visit Plan est HEP for BLE strength (esp proximal strength), counter top balance activities   Consulted and Agree with Plan of Care Patient;Family member/caregiver   Family Member Consulted husband          G-Codes - 11-11-15 6861    Functional Assessment Tool Used BERG: 28/56   Functional Limitation Mobility: Walking and moving around   Mobility: Walking and Moving Around Current Status [phone]) At least 40 percent but less than 60 percent impaired, limited or restricted   Mobility: Walking and Moving Around Goal Status [phone]) At least 20 percent but less than 40 percent impaired, limited or restricted       Problem List Patient Active Problem List   Diagnosis Date Noted  . DOE (dyspnea on exertion) 11/01/2013  . Essential hypertension 11/01/2013  . Abnormal PFTs (pulmonary function tests) 11/01/2013  . Hyperlipidemia with target LDL less than 130 05/19/2008  . VISUAL IMPAIRMENT 05/19/2008  . Allergic rhinitis due to pollen 05/19/2008  . 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 PULMONARY EMBOLISM, HX OF 05/19/2008  . Atrial fibrillation, unspecified 05/19/2008    Cameron Sprang, PT, MPT [facility] [address] [place], [place], [address] Phone: [phone]   Fax:  [phone] 11/11/15, 9:32 AM  Name: Jodi Burns MRN: [id number] Date of Birth: [birth year]

## 2015-10-14 ENCOUNTER — Ambulatory Visit: Payer: Medicare Other | Admitting: Physical Therapy

## 2015-10-14 DIAGNOSIS — R2689 Other abnormalities of gait and mobility: Secondary | ICD-10-CM | POA: Diagnosis not present

## 2015-10-14 DIAGNOSIS — R531 Weakness: Secondary | ICD-10-CM

## 2015-10-14 DIAGNOSIS — R2681 Unsteadiness on feet: Secondary | ICD-10-CM

## 2015-10-14 DIAGNOSIS — Z789 Other specified health status: Secondary | ICD-10-CM | POA: Diagnosis not present

## 2015-10-14 NOTE — Therapy (Signed)
[facility] [address] [place], [place], [address] Phone: [phone]   Fax:  [phone]  Physical Therapy Treatment  Patient Details  Name: Jodi Burns MRN: [id number] Date of Birth: Jun 16, 1926 Referring Provider: Crist Infante, MD  Encounter Date: 10/14/2015      PT End of Session - 10/14/15 2033    Visit Number 2   Number of Visits 17   Date for PT Re-Evaluation 12/11/15   Authorization Type Medicare-G Codes every 10th visit   PT Start Time 1316   PT Stop Time 1402   PT Time Calculation (min) 46 min   Activity Tolerance Patient tolerated treatment well   Behavior During Therapy [facility] for tasks assessed/performed      Past Medical History  Diagnosis Date  . Allergic rhinitis   . Unspecified asthma(493.90)   . Personal history of other diseases of circulatory system   . Personal history of venous thrombosis and embolism 4/94  . Other and unspecified hyperlipidemia   . Subjective visual disturbance, unspecified   . Other retinal disorders(362.89)   . Atrial fibrillation ([place])   . Osteopenia   . Hyperlipidemia   . Dyspnea on exertion   . Vitamin D deficiency   . Reactive airway disease   . 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 Breast biopsy    . Appendectomy    . Total abdominal hysterectomy    . Breast biopsy      bilateral  . Rotator cuff repair  7/04, 2003    right, left  . Carpal tunnel release  2006    right  . Perineoplasty      and rectocele  . Transthoracic echocardiogram  February 2011    EF 55-60%. Grade 1 diastolic dysfunction/relaxation abnormality. Mild MR  . Cardiovascular stress test  August 2013    CPET-MET: Submaximal effort, did not reach anaerobic threshold --> peak VO2 76% (not overly concerning for CAD)  . Pulmonary function test  August 2013     CPET-PFTs: FVC 58%, FEV1 48%; breathing reserve less than 10%, normal DLCO; reduced vital capacity -- suggesting obstructive lung disease    There were no vitals filed for this visit.  Visit Diagnosis:  Unsteadiness  Generalized weakness      Subjective Assessment - 10/14/15 1319    Subjective Pt reports ongoing headache, which she has had since sustaining fall on 12/1.   Patient is accompained by: Family member   Limitations Walking   Pain Score 3    Pain Location Head   Pain Orientation Right   Pain Descriptors / Indicators Dull;Headache   Pain Type Chronic pain   Pain Onset More than a month ago   Pain Frequency Intermittent   Aggravating Factors  laying on R side   Pain Relieving Factors laying on L side and taking Tylenol                Vestibular Assessment - 10/14/15 0001    Vestibular Assessment   General Observation Pt notes feeling as though "head is floating" with supine > sit. Pt also notes "I have to adjust for a few minutes after I change positions." Dizziness with horizontal head turns. Pt legally blind. Recent CT of head revealed Remote lacunar infarct of the right cerebellum.    Symptom Behavior   Type of Dizziness "Funny feeling in  head"   Frequency of Dizziness multiple times per day   Duration of Dizziness seconds   Aggravating Factors Activity in general;Supine to sit;Turning head quickly   Relieving Factors No known relieving factors   Occulomotor Exam   Occulomotor Alignment Normal   Spontaneous Absent   Gaze-induced Absent   Smooth Pursuits Saccades   Saccades Dysmetria  Undershooting in all directions.   Positional Testing   Sidelying Test Sidelying Right;Sidelying Left   Horizontal Canal Testing Horizontal Canal Right;Horizontal Canal Left   Sidelying Right   Sidelying Right Duration NA   Sidelying Right Symptoms No nystagmus   Sidelying Left   Sidelying Left Duration NA   Sidelying Left Symptoms No nystagmus   Horizontal Canal  Right   Horizontal Canal Right Duration NA   Horizontal Canal Right Symptoms Normal   Horizontal Canal Left   Horizontal Canal Left Duration NA   Horizontal Canal Left Symptoms Normal                 [facility] Adult PT Treatment/Exercise - 10/14/15 0001    Exercises   Exercises Other Exercises   Other Exercises  Supine with towel roll at occiput, pt performed chin tuck exercise 10 reps x5-sec holds with cueing from PT for proper technique. See Pt Instructions for details. See "Balance Exercises" tab for further information on [place] HEP.   Manual Therapy   Manual Therapy Myofascial release   Manual therapy comments Supine, performed suboccipital release x5 minutes to address pt-reported headache pain and limited cervical spine mobility. Noted significant soft tissue restrictions, myofascial adhesions at suboccipital region. Noted increase in concordant headache pain when suboccipital musculature palpated.             Balance Exercises - 10/14/15 1354    OTAGO PROGRAM   Head Movements Standing;5 reps   Neck Movements Standing;5 reps   Back Extension Standing;5 reps  single UE support   Trunk Movements Standing;5 reps  husband agreeable to guarding for balance   Ankle Movements Sitting;10 reps   Knee Extensor 10 reps   Knee Flexor 10 reps           PT Education - 10/14/15 2040    Education provided Yes   Education Details [place] HEP initiated. Chin tuck stretch added.   Person(s) Educated Patient;Spouse   Methods Explanation;Demonstration;Verbal cues;Handout   Comprehension Verbalized understanding;Need further instruction          PT Short Term Goals - 10/12/15 0921    PT SHORT TERM GOAL #1   Title Pt will initiate HEP in order to indicate decreased fall risk and improved functional mobility.  (Target Date: 11/08/15)   PT SHORT TERM GOAL #2   Title Pt will improve BERG balance score to 32/56 in order to indicate decreased fall risk.     PT SHORT TERM GOAL #3    Title Pt will perform 5 Time sit to stand test <15 seconds in order to indicate improved functional strength.     PT SHORT TERM GOAL #4   Title Will assess 6MWT and improve by 16' in order to indicate improvement in functional endurance.     PT SHORT TERM GOAL #5   Title Pt will ambulate 200' over indoor surfaces with LRAD at mod I level in order to indicate safe home negotiation.            PT Long Term Goals - 10/12/15 0925    PT LONG TERM GOAL #1   Title Pt  will be independent with HEP in order to indicate decreased fall risk and improved functional mobility.  (Target Date: 12/06/15)   PT LONG TERM GOAL #2   Title Pt will improve BERG balance score to 36/56 in order to indicate decreased fall risk.      PT LONG TERM GOAL #3   Title Pt will improve 6MWT by 160' from baseline in order to indicate improved functional mobility and endurance.     PT LONG TERM GOAL #4   Title Pt will ambulate 500' over paved outdoor surfaces w/ LRAD at S level in order to indicate safe community negotiation.     PT LONG TERM GOAL #5   Title Pt/spouse will independently verbalize fall prevention strategies in order to prevent fall indoors and outside of house.                 Plan - 10/14/15 2034    Clinical Impression Statement Session focused on assessing dizziness, addressing ongoing headache pain, and initiating HEP. Based on brief vestibular, oculomotor assessment, unable to rule out impaired visual-vestibular integration as origin of dizziness. Headache mitigated with suboccipital release. [place] initated.   Pt will benefit from skilled therapeutic intervention in order to improve on the following deficits Cardiopulmonary status limiting activity;Abnormal gait;Decreased activity tolerance;Decreased balance;Decreased endurance;Decreased knowledge of use of DME;Decreased mobility;Decreased strength;Difficulty walking;Impaired perceived functional ability;Impaired flexibility;Impaired  vision/preception;Improper body mechanics;Postural dysfunction;Dizziness   Rehab Potential Good   Clinical Impairments Affecting Rehab Potential co-morbidities and age   PT Frequency 2x / week   PT Duration 8 weeks   PT Treatment/Interventions ADLs/Self Care Home Management;DME Instruction;Gait training;Stair training;Functional mobility training;Therapeutic activities;Therapeutic exercise;Balance training;Neuromuscular re-education;Patient/family education;Energy conservation;Vestibular   PT Next Visit Plan Assess 6MWT, per STG. Consider recommending new AD (pt does own RW and open to using).  Continue educating on [place].    Consulted and Agree with Plan of Care Patient;Family member/caregiver   Family Member Consulted husband, Glendell Docker        Problem List Patient Active Problem List   Diagnosis Date Noted  . DOE (dyspnea on exertion) 11/01/2013  . Essential hypertension 11/01/2013  . Abnormal PFTs (pulmonary function tests) 11/01/2013  . Hyperlipidemia with target LDL less than 130 05/19/2008  . VISUAL IMPAIRMENT 05/19/2008  . Allergic rhinitis due to pollen 05/19/2008  . 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 PULMONARY EMBOLISM, HX OF 05/19/2008  . Atrial fibrillation, unspecified 05/19/2008   Billie Ruddy, PT, DPT [facility] [address] [place], [place], [address] Phone: [phone]   Fax:  [phone] 10/14/2015, 8:41 PM  Name: RHAELYN GIRON MRN: [id number] Date of Birth: 18-May-1926

## 2015-10-14 NOTE — Patient Instructions (Signed)
   Neck Stretch  -Fold up a towel and position yourself on your back -Place towel at the base of your skull and relax your neck -Tuck your chin - Hold for 5 seconds. You should feel a stretch deep in your neck. Relax. Repeat. Perform this exercise 10 times per day. -Perform as needed for headaches

## 2015-10-19 ENCOUNTER — Ambulatory Visit: Payer: Medicare Other | Admitting: Physical Therapy

## 2015-10-19 DIAGNOSIS — Z789 Other specified health status: Secondary | ICD-10-CM | POA: Diagnosis not present

## 2015-10-19 DIAGNOSIS — R531 Weakness: Secondary | ICD-10-CM | POA: Diagnosis not present

## 2015-10-19 DIAGNOSIS — R2681 Unsteadiness on feet: Secondary | ICD-10-CM

## 2015-10-19 DIAGNOSIS — R2689 Other abnormalities of gait and mobility: Secondary | ICD-10-CM | POA: Diagnosis not present

## 2015-10-19 NOTE — Therapy (Signed)
[facility] [address] [place], [place], [address] Phone: [phone]   Fax:  [phone]  Physical Therapy Treatment  Patient Details  Name: Jodi Burns MRN: [id number] Date of Birth: 1926/01/18 Referring Provider: Crist Infante, MD  Encounter Date: 10/19/2015      PT End of Session - 10/19/15 1646    Visit Number 3   Number of Visits 17   Date for PT Re-Evaluation 12/11/15   Authorization Type Medicare-G Codes every 10th visit   PT Start Time 3007   PT Stop Time 1402   PT Time Calculation (min) 45 min   Equipment Utilized During Treatment Gait belt   Activity Tolerance Patient limited by fatigue  Frequent seated rest breaks required due to fatigue and dizziness.   Behavior During Therapy [facility] for tasks assessed/performed      Past Medical History  Diagnosis Date  . Allergic rhinitis   . Unspecified asthma(493.90)   . Personal history of other diseases of circulatory system   . Personal history of venous thrombosis and embolism 4/94  . Other and unspecified hyperlipidemia   . Subjective visual disturbance, unspecified   . Other retinal disorders(362.89)   . Atrial fibrillation ([place])   . Osteopenia   . Hyperlipidemia   . Dyspnea on exertion   . Vitamin D deficiency   . Reactive airway disease   . 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 Breast biopsy    . Appendectomy    . Total abdominal hysterectomy    . Breast biopsy      bilateral  . Rotator cuff repair  7/04, 2003    right, left  . Carpal tunnel release  2006    right  . Perineoplasty      and rectocele  . Transthoracic echocardiogram  February 2011    EF 55-60%. Grade 1 diastolic dysfunction/relaxation abnormality. Mild MR  . Cardiovascular stress test  August 2013    CPET-MET: Submaximal effort, did not reach anaerobic  threshold --> peak VO2 76% (not overly concerning for CAD)  . Pulmonary function test  August 2013    CPET-PFTs: FVC 58%, FEV1 48%; breathing reserve less than 10%, normal DLCO; reduced vital capacity -- suggesting obstructive lung disease    There were no vitals filed for this visit.  Visit Diagnosis:  Unsteadiness  Generalized weakness  Poor balance      Subjective Assessment - 10/19/15 1338    Subjective Pt continues to report headache pain; husband states, "She hasn't been complaining about it as much."  Pt reports she walks in community by holding onto husband's arm. In home, pt "holds onto cane with one hand and uses the other hand to hold onto furniture".   Patient is accompained by: Family member   Limitations Walking            [facility] PT Assessment - 10/19/15 0001    6 Minute walk- Post Test   6 Minute Walk Post Test yes   BP (mmHg) 154/88 mmHg   HR (bpm) 106   02 Sat (%RA) 98 %   Modified Borg Scale for Dyspnea 4- somewhat severe   Perceived Rate of Exertion (Borg) 11- Fairly light   6 minute walk test results    Aerobic Endurance Distance Walked 728   Endurance additional comments with RW with min guard due to  legal blindness                     [facility] Adult PT Treatment/Exercise - 10/19/15 0001    Ambulation/Gait   Ambulation/Gait Yes   Ambulation/Gait Assistance 5: Supervision;4: Min guard   Ambulation/Gait Assistance Details Arrived to clinic holding ambulating while holding onto husband's arm. During 6MWT, pt used RW and required supervision for stability/balance, min guard for obstacle negotiation due to visual impairments   Ambulation Distance (Feet) 120 Feet  +728' during 6MWT   Assistive device 1 person hand held assist;Rolling walker   Gait Pattern Decreased arm swing - left;Step-through pattern;Decreased stride length;Right flexed knee in stance;Left flexed knee in stance;Lateral hip instability;Trunk flexed;Narrow base of support    Ambulation Surface Level;Indoor   Self-Care   Self-Care Other Self-Care Comments   Other Self-Care Comments  Pt reports she currently moves about home by holding onto cane with one hand and holding onto furniture with other hand. This PT suggested that pt use RW for household mobility to improve obstacle negotiation (by placing RW as barrier between pt and other furniture/objects in home) and due to the fact that pt previously sustained significant fall using SPC and furniture for support. Pt and husband politely disagreed with PT recommendation.   Manual Therapy   Manual Therapy Myofascial release   Manual therapy comments Palpation reveals decreased myofacial adhesions atr suboccipital region; however, pt continues to report increased point tenderness to palpation in this region. Peformed suboccipital release x5 minutes to continue to promote more normalized neck posture, address headache pain.             Balance Exercises - 10/19/15 1350    OTAGO PROGRAM   Hip ABductor 10 reps   Ankle Plantorflexors 20 reps, support   Ankle Dorsiflexors 20 reps, support   Knee Bends 10 reps, support             PT Short Term Goals - 10/19/15 1651    PT SHORT TERM GOAL #1   Title Pt will initiate HEP in order to indicate decreased fall risk and improved functional mobility.  (Target Date: 11/08/15)   Status On-going   PT SHORT TERM GOAL #2   Title Pt will improve BERG balance score to 32/56 in order to indicate decreased fall risk.     Status On-going   PT SHORT TERM GOAL #3   Title Pt will perform 5 Time sit to stand test <15 seconds in order to indicate improved functional strength.     Status On-going   PT SHORT TERM GOAL #4   Title Will assess 6MWT and improve by 9' in order to indicate improvement in functional endurance.     Baseline Baseline 6MWT distance = 52' with RW   Status On-going   PT SHORT TERM GOAL #5   Title Pt will ambulate 200' over indoor surfaces with LRAD at mod I  level in order to indicate safe home negotiation.    Status On-going           PT Long Term Goals - 10/19/15 1651    PT LONG TERM GOAL #1   Title Pt will be independent with HEP in order to indicate decreased fall risk and improved functional mobility.  (Target Date: 12/06/15)   Status On-going   PT LONG TERM GOAL #2   Title Pt will improve BERG balance score to 36/56 in order to indicate decreased fall risk.      Status On-going  PT LONG TERM GOAL #3   Title Pt will improve 6MWT by 160' from baseline in order to indicate improved functional mobility and endurance.     Baseline Baseline 6MWT distance = 21' with RW   Status On-going   PT LONG TERM GOAL #4   Title Pt will ambulate 500' over paved outdoor surfaces w/ LRAD at S level in order to indicate safe community negotiation.     Status On-going   PT LONG TERM GOAL #5   Title Pt/spouse will independently verbalize fall prevention strategies in order to prevent fall indoors and outside of house.     Status On-going               Plan - 10/19/15 1647    Clinical Impression Statement Session focused on assessing functional endurance and continued education on [place]. 6MWT distance = 728'. Pt required frequent seated rest breaks due to fatigue and dizziness during [place] HEP; vitals WNL.   Pt will benefit from skilled therapeutic intervention in order to improve on the following deficits Cardiopulmonary status limiting activity;Abnormal gait;Decreased activity tolerance;Decreased balance;Decreased endurance;Decreased knowledge of use of DME;Decreased mobility;Decreased strength;Difficulty walking;Impaired perceived functional ability;Impaired flexibility;Impaired vision/preception;Improper body mechanics;Postural dysfunction;Dizziness   Rehab Potential Good   Clinical Impairments Affecting Rehab Potential co-morbidities and age   PT Frequency 2x / week   PT Duration 8 weeks   PT Treatment/Interventions ADLs/Self Care Home  Management;DME Instruction;Gait training;Stair training;Functional mobility training;Therapeutic activities;Therapeutic exercise;Balance training;Neuromuscular re-education;Patient/family education;Energy conservation;Vestibular   PT Next Visit Plan Finish educating on [place] and Agree with Plan of Care Patient;Family member/caregiver   Family Member Consulted husband, Glendell Docker        Problem List Patient Active Problem List   Diagnosis Date Noted  . DOE (dyspnea on exertion) 11/01/2013  . Essential hypertension 11/01/2013  . Abnormal PFTs (pulmonary function tests) 11/01/2013  . Hyperlipidemia with target LDL less than 130 05/19/2008  . VISUAL IMPAIRMENT 05/19/2008  . Allergic rhinitis due to pollen 05/19/2008  . 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 PULMONARY EMBOLISM, HX OF 05/19/2008  . Atrial fibrillation, unspecified 05/19/2008    Billie Ruddy, PT, DPT [facility] [address] [place], [place], [address] Phone: [phone]   Fax:  [phone] 10/19/2015, 4:53 PM   Name: DEVENEY BAYON MRN: [id number] Date of Birth: 07-Nov-1925

## 2015-10-22 ENCOUNTER — Ambulatory Visit: Payer: Medicare Other | Admitting: Rehabilitation

## 2015-10-22 ENCOUNTER — Encounter: Payer: Self-pay | Admitting: Rehabilitation

## 2015-10-22 DIAGNOSIS — Z789 Other specified health status: Secondary | ICD-10-CM | POA: Diagnosis not present

## 2015-10-22 DIAGNOSIS — R531 Weakness: Secondary | ICD-10-CM | POA: Diagnosis not present

## 2015-10-22 DIAGNOSIS — R2681 Unsteadiness on feet: Secondary | ICD-10-CM | POA: Diagnosis not present

## 2015-10-22 DIAGNOSIS — R2689 Other abnormalities of gait and mobility: Secondary | ICD-10-CM | POA: Diagnosis not present

## 2015-10-22 NOTE — Therapy (Signed)
[facility] [address] [place], [place], [address] Phone: [phone]   Fax:  [phone]  Physical Therapy Treatment  Patient Details  Name: Jodi Burns MRN: [id number] Date of Birth: March 29, 1926 Referring Provider: Crist Infante, MD  Encounter Date: 10/22/2015      PT End of Session - 10/22/15 1321    Visit Number 4   Number of Visits 17   Date for PT Re-Evaluation 12/11/15   Authorization Type Medicare-G Codes every 10th visit   PT Start Time 1315   PT Stop Time 1400   PT Time Calculation (min) 45 min   Equipment Utilized During Treatment Gait belt   Activity Tolerance Patient limited by fatigue  Frequent seated rest breaks required due to fatigue.   Behavior During Therapy [facility] for tasks assessed/performed      Past Medical History  Diagnosis Date  . Allergic rhinitis   . Unspecified asthma(493.90)   . Personal history of other diseases of circulatory system   . Personal history of venous thrombosis and embolism 4/94  . Other and unspecified hyperlipidemia   . Subjective visual disturbance, unspecified   . Other retinal disorders(362.89)   . Atrial fibrillation ([place])   . Osteopenia   . Hyperlipidemia   . Dyspnea on exertion   . Vitamin D deficiency   . Reactive airway disease   . 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 Breast biopsy    . Appendectomy    . Total abdominal hysterectomy    . Breast biopsy      bilateral  . Rotator cuff repair  7/04, 2003    right, left  . Carpal tunnel release  2006    right  . Perineoplasty      and rectocele  . Transthoracic echocardiogram  February 2011    EF 55-60%. Grade 1 diastolic dysfunction/relaxation abnormality. Mild MR  . Cardiovascular stress test  August 2013    CPET-MET: Submaximal effort, did not reach anaerobic threshold -->  peak VO2 76% (not overly concerning for CAD)  . Pulmonary function test  August 2013    CPET-PFTs: FVC 58%, FEV1 48%; breathing reserve less than 10%, normal DLCO; reduced vital capacity -- suggesting obstructive lung disease    There were no vitals filed for this visit.  Visit Diagnosis:  Unsteadiness  Generalized weakness  Poor balance  Poor tolerance for activity      Subjective Assessment - 10/22/15 1319    Subjective "The lady at the beauty parlor said she could tell I was getting stronger."    Patient is accompained by: Family member   Limitations Walking   Currently in Pain? Yes   Pain Score 4    Pain Location Knee   Pain Orientation Right;Left   Pain Descriptors / Indicators Aching;Sore   Pain Type Chronic pain   Pain Onset More than a month ago   Pain Frequency Intermittent                              Balance Exercises - 10/22/15 1508    OTAGO PROGRAM   Backwards Walking Support  x 3 reps   Walking and Turning Around --  with counter top support x 3 reps   Sideways Walking Assistive device  use of counter top   Tandem  Stance 10 seconds, support  x 3 reps each side   Tandem Walk Support  x 3 laps   One Leg Stand 10 seconds, support  x 3 reps BLE   Heel Walking Support  x 3 laps           PT Education - 10/22/15 1321    Education provided Yes   Education Details additions to HEP (OTAGO)   Person(s) Educated Patient   Methods Explanation   Comprehension Verbalized understanding          PT Short Term Goals - 10/19/15 1651    PT SHORT TERM GOAL #1   Title Pt will initiate HEP in order to indicate decreased fall risk and improved functional mobility.  (Target Date: 11/08/15)   Status On-going   PT SHORT TERM GOAL #2   Title Pt will improve BERG balance score to 32/56 in order to indicate decreased fall risk.     Status On-going   PT SHORT TERM GOAL #3   Title Pt will perform 5 Time sit to stand test <15 seconds in  order to indicate improved functional strength.     Status On-going   PT SHORT TERM GOAL #4   Title Will assess 6MWT and improve by 62' in order to indicate improvement in functional endurance.     Baseline Baseline 6MWT distance = 100' with RW   Status On-going   PT SHORT TERM GOAL #5   Title Pt will ambulate 200' over indoor surfaces with LRAD at mod I level in order to indicate safe home negotiation.    Status On-going           PT Long Term Goals - 10/19/15 1651    PT LONG TERM GOAL #1   Title Pt will be independent with HEP in order to indicate decreased fall risk and improved functional mobility.  (Target Date: 12/06/15)   Status On-going   PT LONG TERM GOAL #2   Title Pt will improve BERG balance score to 36/56 in order to indicate decreased fall risk.      Status On-going   PT LONG TERM GOAL #3   Title Pt will improve 6MWT by 160' from baseline in order to indicate improved functional mobility and endurance.     Baseline Baseline 6MWT distance = 83' with RW   Status On-going   PT LONG TERM GOAL #4   Title Pt will ambulate 500' over paved outdoor surfaces w/ LRAD at S level in order to indicate safe community negotiation.     Status On-going   PT LONG TERM GOAL #5   Title Pt/spouse will independently verbalize fall prevention strategies in order to prevent fall indoors and outside of house.     Status On-going               Plan - 10/22/15 1322    Clinical Impression Statement Skilled session focused on continuing to add to HEP with OTAGO exercises, see pt booklet for details.     Pt will benefit from skilled therapeutic intervention in order to improve on the following deficits Cardiopulmonary status limiting activity;Abnormal gait;Decreased activity tolerance;Decreased balance;Decreased endurance;Decreased knowledge of use of DME;Decreased mobility;Decreased strength;Difficulty walking;Impaired perceived functional ability;Impaired flexibility;Impaired  vision/preception;Improper body mechanics;Postural dysfunction;Dizziness   Rehab Potential Good   Clinical Impairments Affecting Rehab Potential co-morbidities and age   PT Frequency 2x / week   PT Duration 8 weeks   PT Treatment/Interventions ADLs/Self Care Home Management;DME Instruction;Gait training;Stair training;Functional mobility training;Therapeutic  activities;Therapeutic exercise;Balance training;Neuromuscular re-education;Patient/family education;Energy conservation;Vestibular   PT Next Visit Plan Finish educating on [place] and Agree with Plan of Care Patient;Family member/caregiver   Family Member Consulted husband, Glendell Docker        Problem List Patient Active Problem List   Diagnosis Date Noted  . DOE (dyspnea on exertion) 11/01/2013  . Essential hypertension 11/01/2013  . Abnormal PFTs (pulmonary function tests) 11/01/2013  . Hyperlipidemia with target LDL less than 130 05/19/2008  . VISUAL IMPAIRMENT 05/19/2008  . Allergic rhinitis due to pollen 05/19/2008  . 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 PULMONARY EMBOLISM, HX OF 05/19/2008  . Atrial fibrillation, unspecified 05/19/2008    Cameron Sprang, PT, MPT [facility] [address] [place], [place], [address] Phone: [phone]   Fax:  [phone] 10/22/2015, 3:11 PM  Name: Jodi Burns MRN: [id number] Date of Birth: 1926/08/26

## 2015-10-25 ENCOUNTER — Ambulatory Visit: Payer: Medicare Other | Admitting: Rehabilitation

## 2015-10-25 ENCOUNTER — Encounter: Payer: Self-pay | Admitting: Rehabilitation

## 2015-10-25 DIAGNOSIS — R2681 Unsteadiness on feet: Secondary | ICD-10-CM

## 2015-10-25 DIAGNOSIS — R531 Weakness: Secondary | ICD-10-CM | POA: Diagnosis not present

## 2015-10-25 DIAGNOSIS — R2689 Other abnormalities of gait and mobility: Secondary | ICD-10-CM | POA: Diagnosis not present

## 2015-10-25 DIAGNOSIS — Z789 Other specified health status: Secondary | ICD-10-CM

## 2015-10-26 NOTE — Therapy (Signed)
[facility] [address] [place], [place], [address] Phone: [phone]   Fax:  [phone]  Physical Therapy Treatment  Patient Details  Name: Jodi Burns MRN: [id number] Date of Birth: 1926/03/01 Referring Provider: Crist Infante, MD  Encounter Date: 10/25/2015      PT End of Session - 10/25/15 1405    Visit Number 5   Number of Visits 17   Date for PT Re-Evaluation 12/11/15   Authorization Type Medicare-G Codes every 10th visit   PT Start Time 1402   PT Stop Time 1445   PT Time Calculation (min) 43 min   Equipment Utilized During Treatment Gait belt   Activity Tolerance Patient limited by fatigue  Frequent seated rest breaks required due to fatigue.   Behavior During Therapy [facility] for tasks assessed/performed      Past Medical History  Diagnosis Date  . Allergic rhinitis   . Unspecified asthma(493.90)   . Personal history of other diseases of circulatory system   . Personal history of venous thrombosis and embolism 4/94  . Other and unspecified hyperlipidemia   . Subjective visual disturbance, unspecified   . Other retinal disorders(362.89)   . Atrial fibrillation ([place])   . Osteopenia   . Hyperlipidemia   . Dyspnea on exertion   . Vitamin D deficiency   . Reactive airway disease   . 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 Breast biopsy    . Appendectomy    . Total abdominal hysterectomy    . Breast biopsy      bilateral  . Rotator cuff repair  7/04, 2003    right, left  . Carpal tunnel release  2006    right  . Perineoplasty      and rectocele  . Transthoracic echocardiogram  February 2011    EF 55-60%. Grade 1 diastolic dysfunction/relaxation abnormality. Mild MR  . Cardiovascular stress test  August 2013    CPET-MET: Submaximal effort, did not reach anaerobic threshold -->  peak VO2 76% (not overly concerning for CAD)  . Pulmonary function test  August 2013    CPET-PFTs: FVC 58%, FEV1 48%; breathing reserve less than 10%, normal DLCO; reduced vital capacity -- suggesting obstructive lung disease    There were no vitals filed for this visit.  Visit Diagnosis:  Unsteadiness  Generalized weakness  Poor balance  Poor tolerance for activity      Subjective Assessment - 10/25/15 1405    Subjective Pts husband reports that she has been doing her exercises daily and is doing "pretty good" with them.     Limitations Walking   Currently in Pain? No/denies          MT:  Performed manual suboccipital release x 2 reps of 2 mins.  Provided pt with tennis balls in stocking sleeve in order to be able to perform at home to aide in reducing head aches and relaxation of suboccipital muscles.  Pt and husband verbalized understanding.    TE:  Performed supine chin tucks as a review to current HEP per pt and husband's request.  Performed x 10 reps with verbal and tactile cues on how to perform adequately.    NMR:  Progressed during session to perform high level balance activities as follows; cone tapping alternating LEs with side stepping progressing to tipping cone over and back up to  increase amount of time in SLS.   Note pt continues to be very UE reliant and requires min to mod A throughout activity to prevent fall, esp when RLE in stance.  Note that RLE tends to be weaker and cause increased pain during these activities.  Ended session with balance activity in // bars standing on small rocker board to better elicit ankle and hip strategy.  Performed x 2 mins with single>no UE support progressing to having pt shift forwards/backwards x 10 reps with single UE>no UE support.  During no UE support, requires min A to maintain balance with cues for how to utilize body to adjust for balance rather than grasping bars.                        PT Education -  10/26/15 [phone]    Education provided Yes   Education Details Education on goals as pt continues to ambulate while holding onto husband, discussed goals of her being at S level with [facility] and importance of not holding onto furniture at home.      Person(s) Educated Patient;Spouse   Methods Explanation   Comprehension Verbalized understanding          PT Short Term Goals - 10/19/15 1651    PT SHORT TERM GOAL #1   Title Pt will initiate HEP in order to indicate decreased fall risk and improved functional mobility.  (Target Date: 11/08/15)   Status On-going   PT SHORT TERM GOAL #2   Title Pt will improve BERG balance score to 32/56 in order to indicate decreased fall risk.     Status On-going   PT SHORT TERM GOAL #3   Title Pt will perform 5 Time sit to stand test <15 seconds in order to indicate improved functional strength.     Status On-going   PT SHORT TERM GOAL #4   Title Will assess 6MWT and improve by 76' in order to indicate improvement in functional endurance.     Baseline Baseline 6MWT distance = 69' with RW   Status On-going   PT SHORT TERM GOAL #5   Title Pt will ambulate 200' over indoor surfaces with LRAD at mod I level in order to indicate safe home negotiation.    Status On-going           PT Long Term Goals - 10/26/15 0829    PT LONG TERM GOAL #1   Title Pt will be independent with HEP in order to indicate decreased fall risk and improved functional mobility.  (Target Date: 12/06/15)   Status On-going   PT LONG TERM GOAL #2   Title Pt will improve BERG balance score to 36/56 in order to indicate decreased fall risk.      Status On-going   PT LONG TERM GOAL #3   Title Pt will improve 6MWT by 160' from baseline in order to indicate improved functional mobility and endurance.     Baseline Baseline 6MWT distance = 39' with RW   Status On-going   PT LONG TERM GOAL #4   Title Pt will ambulate 500' over paved outdoor surfaces w/ LRAD at S level in order to indicate  safe community negotiation.    D/C goal on 10/25/15 due to pts stated goals   Status Deferred   PT LONG TERM GOAL #5   Title Pt/spouse will independently verbalize fall prevention strategies in order to prevent fall indoors and outside of house.  Status On-going               Plan - 10/26/15 0825    Clinical Impression Statement Skilled session focused on education on importance of pt being more independent with use of SPC rather than relying on holding onto husband.  Pt and husband state that they have done this for a long time and would likely not change how they ambulate in community, therefore will likely D/C this goal.  Also addressed suboccipital release (both manually and with use of tennis balls) to reduce headache and high level balance during session.  Continues to require increased rest breaks due to fatigue.     Pt will benefit from skilled therapeutic intervention in order to improve on the following deficits Cardiopulmonary status limiting activity;Abnormal gait;Decreased activity tolerance;Decreased balance;Decreased endurance;Decreased knowledge of use of DME;Decreased mobility;Decreased strength;Difficulty walking;Impaired perceived functional ability;Impaired flexibility;Impaired vision/preception;Improper body mechanics;Postural dysfunction;Dizziness   Rehab Potential Good   Clinical Impairments Affecting Rehab Potential co-morbidities and age   PT Frequency 2x / week   PT Duration 8 weeks   PT Treatment/Interventions ADLs/Self Care Home Management;DME Instruction;Gait training;Stair training;Functional mobility training;Therapeutic activities;Therapeutic exercise;Balance training;Neuromuscular re-education;Patient/family education;Energy conservation;Vestibular   PT Next Visit Plan Finish educating on [place] and Agree with Plan of Care Patient;Family member/caregiver   Family Member Consulted husband, Glendell Docker        Problem List Patient Active  Problem List   Diagnosis Date Noted  . DOE (dyspnea on exertion) 11/01/2013  . Essential hypertension 11/01/2013  . Abnormal PFTs (pulmonary function tests) 11/01/2013  . Hyperlipidemia with target LDL less than 130 05/19/2008  . VISUAL IMPAIRMENT 05/19/2008  . Allergic rhinitis due to pollen 05/19/2008  . 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 PULMONARY EMBOLISM, HX OF 05/19/2008  . Atrial fibrillation, unspecified 05/19/2008    Cameron Sprang, PT, MPT [facility] [address] [place], [place], [address] Phone: [phone]   Fax:  [phone] 10/26/2015, 8:31 AM  Name: Jodi Burns MRN: [id number] Date of Birth: 07/31/1926

## 2015-10-28 ENCOUNTER — Ambulatory Visit: Payer: Medicare Other | Attending: Internal Medicine | Admitting: Rehabilitation

## 2015-10-28 ENCOUNTER — Encounter: Payer: Self-pay | Admitting: Rehabilitation

## 2015-10-28 DIAGNOSIS — Z789 Other specified health status: Secondary | ICD-10-CM | POA: Diagnosis not present

## 2015-10-28 DIAGNOSIS — R531 Weakness: Secondary | ICD-10-CM | POA: Insufficient documentation

## 2015-10-28 DIAGNOSIS — R2681 Unsteadiness on feet: Secondary | ICD-10-CM | POA: Insufficient documentation

## 2015-10-28 DIAGNOSIS — R2689 Other abnormalities of gait and mobility: Secondary | ICD-10-CM | POA: Diagnosis not present

## 2015-10-28 NOTE — Therapy (Signed)
[facility] [address] [place], [place], [address] Phone: [phone]   Fax:  [phone]  Physical Therapy Treatment  Patient Details  Name: Jodi Burns MRN: [id number] Date of Birth: [birth year] Referring Provider: Crist Infante, MD  Encounter Date: 10/28/2015      PT End of Session - 10/28/15 1644    Visit Number 6   Number of Visits 17   Date for PT Re-Evaluation 12/11/15   Authorization Type Medicare-G Codes every 10th visit   PT Start Time 1401   PT Stop Time 1445   PT Time Calculation (min) 44 min   Equipment Utilized During Treatment Gait belt   Activity Tolerance Patient limited by fatigue  Frequent seated rest breaks required due to fatigue.   Behavior During Therapy [facility] for tasks assessed/performed      Past Medical History  Diagnosis Date  . Allergic rhinitis   . Unspecified asthma(493.90)   . Personal history of other diseases of circulatory system   . Personal history of venous thrombosis and embolism 4/94  . Other and unspecified hyperlipidemia   . Subjective visual disturbance, unspecified   . Other retinal disorders(362.89)   . Atrial fibrillation ([place])   . Osteopenia   . Hyperlipidemia   . Dyspnea on exertion   . Vitamin D deficiency   . Reactive airway disease   . 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 Breast biopsy    . Appendectomy    . Total abdominal hysterectomy    . Breast biopsy      bilateral  . Rotator cuff repair  7/04, 2003    right, left  . Carpal tunnel release  2006    right  . Perineoplasty      and rectocele  . Transthoracic echocardiogram  February 2011    EF 55-60%. Grade 1 diastolic dysfunction/relaxation abnormality. Mild MR  . Cardiovascular stress test  August 2013    CPET-MET: Submaximal effort, did not reach anaerobic threshold -->  peak VO2 76% (not overly concerning for CAD)  . Pulmonary function test  August 2013    CPET-PFTs: FVC 58%, FEV1 48%; breathing reserve less than 10%, normal DLCO; reduced vital capacity -- suggesting obstructive lung disease    There were no vitals filed for this visit.  Visit Diagnosis:  Unsteadiness  Poor tolerance for activity  Generalized weakness  Poor balance      Subjective Assessment - 10/28/15 1405    Subjective Per husband, "She is doing her exercises, but she went a little light yesterday."    Patient is accompained by: Family member   Limitations Walking   Currently in Pain? Yes   Pain Score 2    Pain Location Head   Pain Orientation Right;Left   Pain Descriptors / Indicators Aching   Pain Type Chronic pain   Pain Onset More than a month ago   Aggravating Factors  laying on the R side   Pain Relieving Factors Tylenol                TE:  Performed remaining OTAGO HEP during session, see details below.  Educated on performing stairs 1-2 times a day for strengthening and endurance.  Pt and husband verbalized understanding.  Also provided cues on safe hand placement as their stairs go from two handrails to single handrail.  Educated on  how to progress difficulty of sit<>stand without UE support by performing from lower surface.  Tolerated well during session.    Self Care:  Had discussion with pt and husband regarding performance of suboccipital release with use of tennis balls.  Performed again during session and provided handout with written instruction on how to perform at home to reduce headaches.  Also had discussion with pt and husband regarding wanting pt to utilize cane at all times to avoid having to "furniture walk" or rely on husband's assist to increase pts independence.  Both verbalized understanding, however feel that this is habit that both of them have been in for some time and may be difficult to break.                Balance  Exercises - 10/28/15 1650    OTAGO PROGRAM   Toe Walk Support  x 3 laps   Sit to Stand 10 reps, bilateral support  then x 10 without support   Stair Walking Performed stairs x 4 reps during session to better simulate home and address for strengthening.  Cues for posture as she tends to "pull" herself with UEs.            PT Education - 10/28/15 1644    Education provided Yes   Education Details Re-educated on suboccipital release and provided picture handout.    Person(s) Educated Patient;Spouse   Methods Explanation;Demonstration;Handout   Comprehension Verbalized understanding;Returned demonstration          PT Short Term Goals - 10/19/15 1651    PT SHORT TERM GOAL #1   Title Pt will initiate HEP in order to indicate decreased fall risk and improved functional mobility.  (Target Date: 11/08/15)   Status On-going   PT SHORT TERM GOAL #2   Title Pt will improve BERG balance score to 32/56 in order to indicate decreased fall risk.     Status On-going   PT SHORT TERM GOAL #3   Title Pt will perform 5 Time sit to stand test <15 seconds in order to indicate improved functional strength.     Status On-going   PT SHORT TERM GOAL #4   Title Will assess 6MWT and improve by 65' in order to indicate improvement in functional endurance.     Baseline Baseline 6MWT distance = 41' with RW   Status On-going   PT SHORT TERM GOAL #5   Title Pt will ambulate 200' over indoor surfaces with LRAD at mod I level in order to indicate safe home negotiation.    Status On-going           PT Long Term Goals - 10/26/15 0829    PT LONG TERM GOAL #1   Title Pt will be independent with HEP in order to indicate decreased fall risk and improved functional mobility.  (Target Date: 12/06/15)   Status On-going   PT LONG TERM GOAL #2   Title Pt will improve BERG balance score to 36/56 in order to indicate decreased fall risk.      Status On-going   PT LONG TERM GOAL #3   Title Pt will improve 6MWT  by 160' from baseline in order to indicate improved functional mobility and endurance.     Baseline Baseline 6MWT distance = 83' with RW   Status On-going   PT LONG TERM GOAL #4   Title Pt will ambulate 500' over paved outdoor surfaces w/ LRAD at S level in order to indicate safe community negotiation.  D/C goal on 10/25/15 due to pts stated goals   Status Deferred   PT LONG TERM GOAL #5   Title Pt/spouse will independently verbalize fall prevention strategies in order to prevent fall indoors and outside of house.     Status On-going               Plan - 10/28/15 1645    Clinical Impression Statement Skilled session focused on additions to OTAGO HEP and re-education on subocciptal release for HEP.  Continue to POC.    Pt will benefit from skilled therapeutic intervention in order to improve on the following deficits Cardiopulmonary status limiting activity;Abnormal gait;Decreased activity tolerance;Decreased balance;Decreased endurance;Decreased knowledge of use of DME;Decreased mobility;Decreased strength;Difficulty walking;Impaired perceived functional ability;Impaired flexibility;Impaired vision/preception;Improper body mechanics;Postural dysfunction;Dizziness   Rehab Potential Good   Clinical Impairments Affecting Rehab Potential co-morbidities and age   PT Frequency 2x / week   PT Duration 8 weeks   PT Treatment/Interventions ADLs/Self Care Home Management;DME Instruction;Gait training;Stair training;Functional mobility training;Therapeutic activities;Therapeutic exercise;Balance training;Neuromuscular re-education;Patient/family education;Energy conservation;Vestibular   PT Next Visit Plan Continue to work on balance (ankle and hip strategy), gait with SPC (Indoors and paved outdoors)   Consulted and Agree with Plan of Care Patient;Family member/caregiver   Family Member Consulted husband, Glendell Docker        Problem List Patient Active Problem List   Diagnosis Date Noted  .  DOE (dyspnea on exertion) 11/01/2013  . Essential hypertension 11/01/2013  . Abnormal PFTs (pulmonary function tests) 11/01/2013  . Hyperlipidemia with target LDL less than 130 05/19/2008  . VISUAL IMPAIRMENT 05/19/2008  . Allergic rhinitis due to pollen 05/19/2008  . 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 PULMONARY EMBOLISM, HX OF 05/19/2008  . Atrial fibrillation, unspecified 05/19/2008    Cameron Sprang, PT, MPT [facility] [address] [place], [place], [address] Phone: [phone]   Fax:  [phone] 10/28/2015, 4:51 PM  Name: ZAYLYNN RICKETT MRN: [id number] Date of Birth: [birth year]

## 2015-10-28 NOTE — Patient Instructions (Signed)
   Lie flat on the bed without pillows, place tennis balls right below bones at the base of your skull and just relax into it.  This will allow muscles to relax and hopefully help with your headaches.  Do this for about 2-3 mins at a time x 2-3 reps.

## 2015-11-01 ENCOUNTER — Ambulatory Visit: Payer: Medicare Other | Admitting: Physical Therapy

## 2015-11-01 ENCOUNTER — Encounter: Payer: Self-pay | Admitting: Physical Therapy

## 2015-11-01 DIAGNOSIS — R2689 Other abnormalities of gait and mobility: Secondary | ICD-10-CM | POA: Diagnosis not present

## 2015-11-01 DIAGNOSIS — R531 Weakness: Secondary | ICD-10-CM | POA: Diagnosis not present

## 2015-11-01 DIAGNOSIS — Z789 Other specified health status: Secondary | ICD-10-CM | POA: Diagnosis not present

## 2015-11-01 DIAGNOSIS — R2681 Unsteadiness on feet: Secondary | ICD-10-CM

## 2015-11-01 NOTE — Therapy (Signed)
[facility] [address] [place], [place], [address] Phone: [phone]   Fax:  [phone]  Physical Therapy Treatment  Patient Details  Name: Jodi Burns MRN: [id number] Date of Birth: [birth year] Referring Provider: Crist Infante, MD  Encounter Date: 11/01/2015      PT End of Session - 11/01/15 1416    Visit Number 7   Number of Visits 17   Date for PT Re-Evaluation 12/11/15   Authorization Type Medicare-G Codes every 10th visit   PT Start Time 1318   PT Stop Time 1400   PT Time Calculation (min) 42 min   Equipment Utilized During Treatment Gait belt   Activity Tolerance Patient tolerated treatment well  Frequent seated rest breaks required due to fatigue.   Behavior During Therapy [facility] for tasks assessed/performed      Past Medical History  Diagnosis Date  . Allergic rhinitis   . Unspecified asthma(493.90)   . Personal history of other diseases of circulatory system   . Personal history of venous thrombosis and embolism 4/94  . Other and unspecified hyperlipidemia   . Subjective visual disturbance, unspecified   . Other retinal disorders(362.89)   . Atrial fibrillation ([place])   . Osteopenia   . Hyperlipidemia   . Dyspnea on exertion   . Vitamin D deficiency   . Reactive airway disease   . 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 Breast biopsy    . Appendectomy    . Total abdominal hysterectomy    . Breast biopsy      bilateral  . Rotator cuff repair  7/04, 2003    right, left  . Carpal tunnel release  2006    right  . Perineoplasty      and rectocele  . Transthoracic echocardiogram  February 2011    EF 55-60%. Grade 1 diastolic dysfunction/relaxation abnormality. Mild MR  . Cardiovascular stress test  August 2013    CPET-MET: Submaximal effort, did not reach anaerobic threshold  --> peak VO2 76% (not overly concerning for CAD)  . Pulmonary function test  August 2013    CPET-PFTs: FVC 58%, FEV1 48%; breathing reserve less than 10%, normal DLCO; reduced vital capacity -- suggesting obstructive lung disease    There were no vitals filed for this visit.  Visit Diagnosis:  Unsteadiness  Poor tolerance for activity  Poor balance      Subjective Assessment - 11/01/15 1321    Subjective Didn't sleep well last night and is a little wobbly today.  Brought cane today. Performed HEP at home; still has trouble with the suboccipital there ex.   Patient is accompained by: Family member   Limitations Walking   Currently in Pain? Yes   Pain Score 2    Pain Location Head   Pain Orientation Left   Pain Descriptors / Indicators Sore   Pain Type Chronic pain   Pain Onset More than a month ago   Pain Frequency Intermittent                         [facility] Adult PT Treatment/Exercise - 11/01/15 0001    Transfers   Transfers Sit to Stand;Stand to Sit   Sit to Stand 6: Modified independent (Device/Increase time)  no UE support x10   Ambulation/Gait   Ambulation/Gait Yes   Ambulation/Gait  Assistance 5: Supervision   Ambulation/Gait Assistance Details dynamic with changes in direction, speed, and visual scanning  no LOB, but some sequencing issues with cane   Ambulation Distance (Feet) 230 Feet  + 6 min continuous   Assistive device Straight cane   Gait Pattern Decreased arm swing - left;Step-through pattern;Decreased stride length;Right flexed knee in stance;Left flexed knee in stance;Lateral hip instability;Trunk flexed;Narrow base of support   Ambulation Surface Level;Unlevel;Indoor   Ramp 4: Min assist  SPC and HHA, increased fear of falling   Curb 3: Mod assist  2" curb; attempted 5' curb but declined due to fear.             PT Education - 11/01/15 1414    Education provided No   Education Details Pt/husband on technique for HHA with ramp  or curb negotiation.   Person(s) Educated Patient;Spouse   Methods Explanation;Demonstration;Verbal cues   Comprehension Verbalized understanding;Verbal cues required;Need further instruction          PT Short Term Goals - 10/19/15 1651    PT SHORT TERM GOAL #1   Title Pt will initiate HEP in order to indicate decreased fall risk and improved functional mobility.  (Target Date: 11/08/15)   Status On-going   PT SHORT TERM GOAL #2   Title Pt will improve BERG balance score to 32/56 in order to indicate decreased fall risk.     Status On-going   PT SHORT TERM GOAL #3   Title Pt will perform 5 Time sit to stand test <15 seconds in order to indicate improved functional strength.     Status On-going   PT SHORT TERM GOAL #4   Title Will assess 6MWT and improve by 37' in order to indicate improvement in functional endurance.     Baseline Baseline 6MWT distance = 96' with RW   Status On-going   PT SHORT TERM GOAL #5   Title Pt will ambulate 200' over indoor surfaces with LRAD at mod I level in order to indicate safe home negotiation.    Status On-going           PT Long Term Goals - 10/26/15 0829    PT LONG TERM GOAL #1   Title Pt will be independent with HEP in order to indicate decreased fall risk and improved functional mobility.  (Target Date: 12/06/15)   Status On-going   PT LONG TERM GOAL #2   Title Pt will improve BERG balance score to 36/56 in order to indicate decreased fall risk.      Status On-going   PT LONG TERM GOAL #3   Title Pt will improve 6MWT by 160' from baseline in order to indicate improved functional mobility and endurance.     Baseline Baseline 6MWT distance = 82' with RW   Status On-going   PT LONG TERM GOAL #4   Title Pt will ambulate 500' over paved outdoor surfaces w/ LRAD at S level in order to indicate safe community negotiation.    D/C goal on 10/25/15 due to pts stated goals   Status Deferred   PT LONG TERM GOAL #5   Title Pt/spouse will  independently verbalize fall prevention strategies in order to prevent fall indoors and outside of house.     Status On-going               Plan - 11/01/15 1417    Clinical Impression Statement Skilled session focused on sit<>stand technique, dynamic gait and activity tolerance to longer gait  distances.  Progressing well with gait activity tolerance but very fearful of ramp and curb negotiation.   Pt will benefit from skilled therapeutic intervention in order to improve on the following deficits Cardiopulmonary status limiting activity;Abnormal gait;Decreased activity tolerance;Decreased balance;Decreased endurance;Decreased knowledge of use of DME;Decreased mobility;Decreased strength;Difficulty walking;Impaired perceived functional ability;Impaired flexibility;Impaired vision/preception;Improper body mechanics;Postural dysfunction;Dizziness   Rehab Potential Good   Clinical Impairments Affecting Rehab Potential co-morbidities and age   PT Frequency 2x / week   PT Duration 8 weeks   PT Treatment/Interventions ADLs/Self Care Home Management;DME Instruction;Gait training;Stair training;Functional mobility training;Therapeutic activities;Therapeutic exercise;Balance training;Neuromuscular re-education;Patient/family education;Energy conservation;Vestibular   PT Next Visit Plan Continue to work on balance (ankle and hip strategy), gait with SPC (Indoors and paved outdoors), slowly progress curb height tolerance >2".   Consulted and Agree with Plan of Care Patient;Family member/caregiver   Family Member Consulted husband, Glendell Docker        Problem List Patient Active Problem List   Diagnosis Date Noted  . DOE (dyspnea on exertion) 11/01/2013  . Essential hypertension 11/01/2013  . Abnormal PFTs (pulmonary function tests) 11/01/2013  . Hyperlipidemia with target LDL less than 130 05/19/2008  . VISUAL IMPAIRMENT 05/19/2008  . Allergic rhinitis due to pollen 05/19/2008  . Allergic-infective  asthma 05/19/2008  . PULMONARY EMBOLISM, HX OF 05/19/2008  . Atrial fibrillation, unspecified 05/19/2008    Bjorn Loser, PTA  11/01/2015, 2:23 PM [facility] [address] [place], [place], [address] Phone: [phone]   Fax:  [phone]  Name: Jodi Burns MRN: [id number] Date of Birth: [birth year]

## 2015-11-04 ENCOUNTER — Ambulatory Visit: Payer: Medicare Other | Admitting: Physical Therapy

## 2015-11-04 DIAGNOSIS — R531 Weakness: Secondary | ICD-10-CM

## 2015-11-04 DIAGNOSIS — R2681 Unsteadiness on feet: Secondary | ICD-10-CM | POA: Diagnosis not present

## 2015-11-04 DIAGNOSIS — R2689 Other abnormalities of gait and mobility: Secondary | ICD-10-CM | POA: Diagnosis not present

## 2015-11-04 DIAGNOSIS — Z789 Other specified health status: Secondary | ICD-10-CM

## 2015-11-04 NOTE — Therapy (Signed)
[facility] [address] [place], [place], [address] Phone: [phone]   Fax:  [phone]  Physical Therapy Treatment  Patient Details  Name: Jodi Burns MRN: [id number] Date of Birth: 1926/08/11 Referring Provider: Crist Infante, MD  Encounter Date: 11/04/2015      PT End of Session - 11/04/15 1934    Visit Number 8   Number of Visits 17   Date for PT Re-Evaluation 12/11/15   Authorization Type Medicare-G Codes every 10th visit   PT Start Time 1451   PT Stop Time 1538   PT Time Calculation (min) 47 min   Activity Tolerance Patient tolerated treatment well   Behavior During Therapy [facility] for tasks assessed/performed      Past Medical History  Diagnosis Date  . Allergic rhinitis   . Unspecified asthma(493.90)   . Personal history of other diseases of circulatory system   . Personal history of venous thrombosis and embolism 4/94  . Other and unspecified hyperlipidemia   . Subjective visual disturbance, unspecified   . Other retinal disorders(362.89)   . Atrial fibrillation ([place])   . Osteopenia   . Hyperlipidemia   . Dyspnea on exertion   . Vitamin D deficiency   . Reactive airway disease   . 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 Breast biopsy    . Appendectomy    . Total abdominal hysterectomy    . Breast biopsy      bilateral  . Rotator cuff repair  7/04, 2003    right, left  . Carpal tunnel release  2006    right  . Perineoplasty      and rectocele  . Transthoracic echocardiogram  February 2011    EF 55-60%. Grade 1 diastolic dysfunction/relaxation abnormality. Mild MR  . Cardiovascular stress test  August 2013    CPET-MET: Submaximal effort, did not reach anaerobic threshold --> peak VO2 76% (not overly concerning for CAD)  . Pulmonary function test  August 2013     CPET-PFTs: FVC 58%, FEV1 48%; breathing reserve less than 10%, normal DLCO; reduced vital capacity -- suggesting obstructive lung disease    There were no vitals filed for this visit.  Visit Diagnosis:  Unsteadiness  Poor tolerance for activity  Generalized weakness      Subjective Assessment - 11/04/15 1449    Subjective Pt and husband both perceive pt's confidence (in reference to walking and balance) has improved since beginning this episode of PT.  Husband would like to begin an exercise regimen and would like this PT to help with that, if possible.    Patient is accompained by: Family member  husband   Limitations Walking   Currently in Pain? Yes   Pain Score 2    Pain Location Head   Pain Orientation Left   Pain Descriptors / Indicators Headache   Pain Type Chronic pain   Pain Onset More than a month ago   Pain Frequency Intermittent   Aggravating Factors  lying on R side   Pain Relieving Factors Tylenol   Multiple Pain Sites No            [facility] PT Assessment - 11/04/15 0001    Berg Balance Test   Sit to Stand Able to stand without using hands and stabilize independently   Standing Unsupported Able to stand safely 2 minutes  Sitting with Back Unsupported but Feet Supported on Floor or Stool Able to sit safely and securely 2 minutes   Stand to Sit Sits safely with minimal use of hands   Transfers Able to transfer safely, definite need of hands   Standing Unsupported with Eyes Closed Able to stand 10 seconds with supervision   Standing Ubsupported with Feet Together Able to place feet together independently and stand for 1 minute with supervision   From Standing, Reach Forward with Outstretched Arm Can reach forward >12 cm safely (5")   From Standing Position, Pick up Object from Floor Able to pick up shoe, needs supervision   From Standing Position, Turn to Look Behind Over each Shoulder Turn sideways only but maintains balance   Turn 360 Degrees Able to turn 360  degrees safely but slowly   Standing Unsupported, Alternately Place Feet on Step/Stool Able to complete >2 steps/needs minimal assist   Standing Unsupported, One Foot in Front Able to plae foot ahead of the other independently and hold 30 seconds   Standing on One Leg Tries to lift leg/unable to hold 3 seconds but remains standing independently   Total Score 40   Berg comment: Score of 37-45 indicates significant fall risk.                     [facility] Adult PT Treatment/Exercise - 11/04/15 0001    Ambulation/Gait   Ambulation/Gait Yes   Ambulation/Gait Assistance 5: Supervision   Ambulation/Gait Assistance Details No overt LOB   Ambulation Distance (Feet) 200 Feet   Assistive device Straight cane   Gait Pattern Decreased arm swing - left;Step-through pattern;Decreased stride length;Right flexed knee in stance;Left flexed knee in stance;Lateral hip instability;Trunk flexed;Narrow base of support   Ambulation Surface Level;Indoor   Standardized Balance Assessment   Standardized Balance Assessment --   Self-Care   Self-Care Other Self-Care Comments   Other Self-Care Comments  Provided education on community resources for exercise and falls prevention (See Pt Instructions for details) and Silver Sneakers. Educated husband on how to safely setup patient on NuStep with effective return demonstration from husband.   Exercises   Exercises Other Exercises   Other Exercises  NuStep level 1 x11 minutes with BUE/LE for increased endurance and functional activity tolerance.                PT Education - 11/04/15 1928    Education provided Yes   Education Details Berg score, progress, and continued fall risk.  Educated husband on safely setting up NuStep. Provided pt/husband with community resources for exercise and falls prevention.   Person(s) Educated Patient;Spouse   Methods Explanation;Demonstration;Handout   Comprehension Verbalized understanding;Returned demonstration           PT Short Term Goals - 11/04/15 1935    PT SHORT TERM GOAL #1   Title Pt will initiate HEP in order to indicate decreased fall risk and improved functional mobility.  (Target Date: 11/08/15)   Status On-going   PT SHORT TERM GOAL #2   Title Pt will improve BERG balance score to 32/56 in order to indicate decreased fall risk.     Baseline Met 3/9, with Berg score of 40/56.   Status Achieved   PT SHORT TERM GOAL #3   Title Pt will perform 5 Time sit to stand test <15 seconds in order to indicate improved functional strength.     Status On-going   PT SHORT TERM GOAL #4   Title Will assess  6MWT and improve by 45' in order to indicate improvement in functional endurance.     Baseline Baseline 6MWT distance = 9' with RW   Status On-going   PT SHORT TERM GOAL #5   Title Pt will ambulate 200' over indoor surfaces with LRAD at mod I level in order to indicate safe home negotiation.    Status On-going           PT Long Term Goals - 11/04/15 1935    PT LONG TERM GOAL #1   Title Pt will be independent with HEP in order to indicate decreased fall risk and improved functional mobility.  (Target Date: 12/06/15)   Status On-going   PT LONG TERM GOAL #2   Title Pt will improve BERG balance score to 36/56 in order to indicate decreased fall risk.      Baseline Met 3/9, with Berg score of 40/56.   Status Achieved   PT LONG TERM GOAL #3   Title Pt will improve 6MWT by 160' from baseline in order to indicate improved functional mobility and endurance.     Baseline Baseline 6MWT distance = 69' with RW   Status On-going   PT LONG TERM GOAL #4   Title Pt will ambulate 500' over paved outdoor surfaces w/ LRAD at S level in order to indicate safe community negotiation.    D/C goal on 10/25/15 due to pts stated goals   Status Deferred   PT LONG TERM GOAL #5   Title Pt/spouse will independently verbalize fall prevention strategies in order to prevent fall indoors and outside of house.      Status On-going               Plan - 11/04/15 1936    Clinical Impression Statement Session focused on beginning STG assessment and initiating education on community fitness. Berg score has improved rom 28/56 to 40/56 since PT evaluation. Educated pt/husband on resources for community fitness, as pt/husband are both very pleased wiith pt's current functional mobility and husband requesting PT address endurance.  Will plan to address remaining STG's at next session and, if majority of short and long term goals met, will tentatively plan to DC. Pt/husband were in full agreement.   Pt will benefit from skilled therapeutic intervention in order to improve on the following deficits Cardiopulmonary status limiting activity;Abnormal gait;Decreased activity tolerance;Decreased balance;Decreased endurance;Decreased knowledge of use of DME;Decreased mobility;Decreased strength;Difficulty walking;Impaired perceived functional ability;Impaired flexibility;Impaired vision/preception;Improper body mechanics;Postural dysfunction;Dizziness   Rehab Potential Good   Clinical Impairments Affecting Rehab Potential co-morbidities and age   PT Frequency 2x / week   PT Duration 8 weeks   PT Treatment/Interventions ADLs/Self Care Home Management;DME Instruction;Gait training;Stair training;Functional mobility training;Therapeutic activities;Therapeutic exercise;Balance training;Neuromuscular re-education;Patient/family education;Energy conservation;Vestibular   PT Next Visit Plan Assess remaining STG's and LTG's. Plan to DC, if majority of goals met and pt/husband comfortable with DC. ** Provide paper handout for community exercise/fall prevention resources (see Pt Instructions for 3/9).    Consulted and Agree with Plan of Care Patient;Family member/caregiver   Family Member Consulted husband, Glendell Docker        Problem List Patient Active Problem List   Diagnosis Date Noted  . DOE (dyspnea on exertion) 11/01/2013   . Essential hypertension 11/01/2013  . Abnormal PFTs (pulmonary function tests) 11/01/2013  . Hyperlipidemia with target LDL less than 130 05/19/2008  . VISUAL IMPAIRMENT 05/19/2008  . Allergic rhinitis due to pollen 05/19/2008  . 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 PULMONARY EMBOLISM,  HX OF 05/19/2008  . Atrial fibrillation, unspecified 05/19/2008   Billie Ruddy, PT, DPT [facility] [address] [place], [place], [address] Phone: [phone]   Fax:  [phone] 11/04/2015, 7:44 PM   Name: Jodi Burns MRN: [id number] Date of Birth: [birth year]

## 2015-11-04 NOTE — Patient Instructions (Signed)

## 2015-11-08 ENCOUNTER — Ambulatory Visit: Payer: Medicare Other | Admitting: Physical Therapy

## 2015-11-08 ENCOUNTER — Encounter: Payer: Self-pay | Admitting: Physical Therapy

## 2015-11-08 DIAGNOSIS — R2689 Other abnormalities of gait and mobility: Secondary | ICD-10-CM | POA: Diagnosis not present

## 2015-11-08 DIAGNOSIS — R2681 Unsteadiness on feet: Secondary | ICD-10-CM | POA: Diagnosis not present

## 2015-11-08 DIAGNOSIS — Z789 Other specified health status: Secondary | ICD-10-CM

## 2015-11-08 DIAGNOSIS — R531 Weakness: Secondary | ICD-10-CM

## 2015-11-08 NOTE — Therapy (Signed)
[facility] [address] [place], [place], [address] Phone: [phone]   Fax:  [phone]  Patient Details  Name: Jodi Burns MRN: [id number] Date of Birth: [birth year] Referring Provider:  No ref. provider found  Encounter Date: 11/08/2015  PHYSICAL THERAPY DISCHARGE SUMMARY  Visits from Start of Care: 9  Current functional level related to goals / functional outcomes:     PT Long Term Goals - 11/08/15 1414    PT LONG TERM GOAL #1   Title Pt will be independent with HEP in order to indicate decreased fall risk and improved functional mobility.  (Target Date: 12/06/15)   Baseline Pt/spouse Reports Ind. understanding and performs HEP daily. Met 3/13   Status Achieved   PT LONG TERM GOAL #2   Title Pt will improve BERG balance score to 36/56 in order to indicate decreased fall risk.      Baseline Met 3/9, with Berg score of 40/56.   Status Achieved   PT LONG TERM GOAL #3   Title Pt will improve 6MWT by 160' from baseline in order to indicate improved functional mobility and endurance.     Baseline Baseline 6MWT distance = 743' with SPC. tested 3/13   Status Not Met   PT LONG TERM GOAL #4   Title Pt will ambulate 500' over paved outdoor surfaces w/ LRAD at S level in order to indicate safe community negotiation.    D/C goal on 10/25/15 due to pts stated goals   Status Deferred   PT LONG TERM GOAL #5   Title Pt/spouse will independently verbalize fall prevention strategies in order to prevent fall indoors and outside of house.     Baseline Reviewed handout and verbalized understanding. 3/13.   Status Achieved        Remaining deficits: Pt continues to demonstrate decreased CV endurance and requires supervision for all ambulation due to visual impairments. Pt and husband have initiated a home walking program to address CV endurance and pt currently performing [place] HEP with assist/supervision of husband to  maximize LE strength and stability/balance.   Education / Equipment: [employer], walking program, education on available community fitness classes and resources, fall prevention strategies in home environment.  Plan: Patient agrees to discharge.  Patient goals were partially met. Patient is being discharged due to being pleased with the current functional level.  ?????       Billie Ruddy, PT, DPT [facility] [address] [place], [place], [address] Phone: [phone]   Fax:  [phone] 11/08/2015, 5:01 PM

## 2015-11-08 NOTE — Therapy (Addendum)
[facility] [address] [place], [place], [address] Phone: [phone]   Fax:  [phone]  Physical Therapy Treatment  Patient Details  Name: Jodi Burns MRN: [id number] Date of Birth: November 24, 1925 Referring Provider: Crist Infante, MD  Encounter Date: 11/08/2015    Past Medical History  Diagnosis Date  . Allergic rhinitis   . Unspecified asthma(493.90)   . Personal history of other diseases of circulatory system   . Personal history of venous thrombosis and embolism 4/94  . Other and unspecified hyperlipidemia   . Subjective visual disturbance, unspecified   . Other retinal disorders(362.89)   . Atrial fibrillation ([place])   . Osteopenia   . Hyperlipidemia   . Dyspnea on exertion   . Vitamin D deficiency   . Reactive airway disease   . 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 Breast biopsy    . Appendectomy    . Total abdominal hysterectomy    . Breast biopsy      bilateral  . Rotator cuff repair  7/04, 2003    right, left  . Carpal tunnel release  2006    right  . Perineoplasty      and rectocele  . Transthoracic echocardiogram  February 2011    EF 55-60%. Grade 1 diastolic dysfunction/relaxation abnormality. Mild MR  . Cardiovascular stress test  August 2013    CPET-MET: Submaximal effort, did not reach anaerobic threshold --> peak VO2 76% (not overly concerning for CAD)  . Pulmonary function test  August 2013    CPET-PFTs: FVC 58%, FEV1 48%; breathing reserve less than 10%, normal DLCO; reduced vital capacity -- suggesting obstructive lung disease    There were no vitals filed for this visit.  Visit Diagnosis:  Unsteadiness  Poor tolerance for activity  Generalized weakness  Poor balance         PT End of Session - 11/08/15 1510    Visit Number 9   Number of Visits 17    Date for PT Re-Evaluation 12/11/15   Authorization Type Medicare-G Codes every 10th visit   PT Start Time 1410   PT Stop Time 1500   PT Time Calculation (min) 50 min   Activity Tolerance Patient tolerated treatment well   Behavior During Therapy [facility] for tasks assessed/performed                  Subjective Assessment - 11/08/15 1412    Subjective Agrees that today will be her last PT session.   Patient is accompained by: Family member  husband   Limitations Walking   Currently in Pain? No/denies   Pain Onset More than a month ago                       [facility] Adult PT Treatment/Exercise - 11/08/15 0001    Knee/Hip Exercises: Aerobic   Nustep L=4 61mn to L=2 311m due to fatigue                    PT Education - 11/08/15 1450    Education provided Yes   Education Details Discussed goals tested and Fall prevention handout and how to progress [place] and walking program.   Person(s) Educated Patient   Methods Explanation;Handout   Comprehension Verbalized understanding          PT  Short Term Goals - 11/08/15 1417    PT SHORT TERM GOAL #1   Title Pt will initiate HEP in order to indicate decreased fall risk and improved functional mobility.  (Target Date: 11/08/15)   Baseline Reports Ind. with understanding and performs daily.    Status Achieved   PT SHORT TERM GOAL #2   Title Pt will improve BERG balance score to 32/56 in order to indicate decreased fall risk.     Baseline Met 3/9, with Berg score of 40/56.   Status Achieved   PT SHORT TERM GOAL #3   Title Pt will perform 5 Time sit to stand test <15 seconds in order to indicate improved functional strength.     Baseline performed without UE support safely but slowly 20 seconds.   Status Partially Met   PT SHORT TERM GOAL #4   Title Will assess 6MWT and improve by 4' in order to indicate improvement in functional endurance.     Baseline Baseline 6MWT distance = 728' with [facility]   Status  Not Met   PT SHORT TERM GOAL #5   Title Pt will ambulate 200' over indoor surfaces with LRAD at mod I level in order to indicate safe home negotiation.    Baseline Demont[place] ModI amb. 200' without LOB and reports amb at home with [facility] Mod I.   Status Achieved           PT Long Term Goals - 11/08/15 1414    PT LONG TERM GOAL #1   Title Pt will be independent with HEP in order to indicate decreased fall risk and improved functional mobility.  (Target Date: 12/06/15)   Baseline Pt/spouse Reports Ind. understanding and performs HEP daily. Met 3/13   Status Achieved   PT LONG TERM GOAL #2   Title Pt will improve BERG balance score to 36/56 in order to indicate decreased fall risk.      Baseline Met 3/9, with Berg score of 40/56.   Status Achieved   PT LONG TERM GOAL #3   Title Pt will improve 6MWT by 160' from baseline in order to indicate improved functional mobility and endurance.     Baseline Baseline 6MWT distance = 743' with SPC. tested 3/13   Status Not Met   PT LONG TERM GOAL #4   Title Pt will ambulate 500' over paved outdoor surfaces w/ LRAD at S level in order to indicate safe community negotiation.    D/C goal on 10/25/15 due to pts stated goals   Status Deferred   PT LONG TERM GOAL #5   Title Pt/spouse will independently verbalize fall prevention strategies in order to prevent fall indoors and outside of house.     Baseline Reviewed handout and verbalized understanding. 3/13.   Status Achieved               Plan - 11/08/15 1451    Clinical Impression Statement Skilled session focused on checking STGs/LTGs and pt d/c.  Pt/spouse is pleased with pt progress in gaining confidence, stablity and activity tolerance with gait and general mobility. Pt plans to continue performing HEP and progress with her and her husband's walking program.                                     Pt will benefit from skilled therapeutic intervention in order to improve on the following deficits  Cardiopulmonary status limiting activity;Abnormal  gait;Decreased activity tolerance;Decreased balance;Decreased endurance;Decreased knowledge of use of DME;Decreased mobility;Decreased strength;Difficulty walking;Impaired perceived functional ability;Impaired flexibility;Impaired vision/preception;Improper body mechanics;Postural dysfunction;Dizziness   Rehab Potential Good   Clinical Impairments Affecting Rehab Potential co-morbidities and age   PT Frequency 2x / week   PT Duration 8 weeks   PT Treatment/Interventions ADLs/Self Care Home Management;DME Instruction;Gait training;Stair training;Functional mobility training;Therapeutic activities;Therapeutic exercise;Balance training;Neuromuscular re-education;Patient/family education;Energy conservation;Vestibular   PT Next Visit Plan Send D/C summary to MD.   Consulted and Agree with Plan of Care Patient;Family member/caregiver   Family Member Consulted husband, Glendell Docker        Problem List Patient Active Problem List   Diagnosis Date Noted  . DOE (dyspnea on exertion) 11/01/2013  . Essential hypertension 11/01/2013  . Abnormal PFTs (pulmonary function tests) 11/01/2013  . Hyperlipidemia with target LDL less than 130 05/19/2008  . VISUAL IMPAIRMENT 05/19/2008  . Allergic rhinitis due to pollen 05/19/2008  . 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 PULMONARY EMBOLISM, HX OF 05/19/2008  . Atrial fibrillation, unspecified 05/19/2008   Bjorn Loser, PTA  14-Nov-2015, 3:09 PM  [facility] [address] [place], [place], [address] Phone: [phone]   Fax:  [phone]  Name: Jodi Burns MRN: [id number] Date of Birth: 1926/03/02   Addendum By Billie Ruddy, PT, DPT [facility] [address] [place], [place], [address] Phone: [phone]   Fax:  [phone] November 14, 2015, 3:26 PM  G Codes Functional Assessment Tool: Merrilee Jansky  40/56 Functional Limitation: Mobility: Walking and Moving Around Goal Status [phone]): CJ Discharge Status [phone]): CJ

## 2015-11-11 ENCOUNTER — Encounter: Payer: Medicare Other | Admitting: Physical Therapy

## 2015-11-15 ENCOUNTER — Encounter: Payer: Medicare Other | Admitting: Physical Therapy

## 2015-11-18 ENCOUNTER — Encounter: Payer: Medicare Other | Admitting: Physical Therapy

## 2015-11-22 ENCOUNTER — Encounter: Payer: Medicare Other | Admitting: Physical Therapy

## 2015-11-25 ENCOUNTER — Encounter: Payer: Medicare Other | Admitting: Physical Therapy

## 2015-11-26 DIAGNOSIS — H353124 Nonexudative age-related macular degeneration, left eye, advanced atrophic with subfoveal involvement: Secondary | ICD-10-CM | POA: Diagnosis not present

## 2015-11-26 DIAGNOSIS — H353114 Nonexudative age-related macular degeneration, right eye, advanced atrophic with subfoveal involvement: Secondary | ICD-10-CM | POA: Diagnosis not present

## 2015-11-26 DIAGNOSIS — H401134 Primary open-angle glaucoma, bilateral, indeterminate stage: Secondary | ICD-10-CM | POA: Diagnosis not present

## 2015-11-26 DIAGNOSIS — Z0100 Encounter for examination of eyes and vision without abnormal findings: Secondary | ICD-10-CM | POA: Diagnosis not present

## 2015-11-29 ENCOUNTER — Encounter: Payer: Medicare Other | Admitting: Physical Therapy

## 2015-12-02 ENCOUNTER — Encounter: Payer: Medicare Other | Admitting: Physical Therapy

## 2015-12-06 ENCOUNTER — Encounter: Payer: Medicare Other | Admitting: Physical Therapy

## 2015-12-09 ENCOUNTER — Encounter: Payer: Medicare Other | Admitting: Physical Therapy

## 2015-12-21 DIAGNOSIS — R8299 Other abnormal findings in urine: Secondary | ICD-10-CM | POA: Diagnosis not present

## 2015-12-21 DIAGNOSIS — E538 Deficiency of other specified B group vitamins: Secondary | ICD-10-CM | POA: Diagnosis not present

## 2015-12-21 DIAGNOSIS — N390 Urinary tract infection, site not specified: Secondary | ICD-10-CM | POA: Diagnosis not present

## 2015-12-21 DIAGNOSIS — E559 Vitamin D deficiency, unspecified: Secondary | ICD-10-CM | POA: Diagnosis not present

## 2015-12-21 DIAGNOSIS — M859 Disorder of bone density and structure, unspecified: Secondary | ICD-10-CM | POA: Diagnosis not present

## 2015-12-21 DIAGNOSIS — D518 Other vitamin B12 deficiency anemias: Secondary | ICD-10-CM | POA: Diagnosis not present

## 2015-12-21 DIAGNOSIS — E784 Other hyperlipidemia: Secondary | ICD-10-CM | POA: Diagnosis not present

## 2015-12-28 DIAGNOSIS — D519 Vitamin B12 deficiency anemia, unspecified: Secondary | ICD-10-CM | POA: Diagnosis not present

## 2015-12-28 DIAGNOSIS — I700 Atherosclerosis of aorta: Secondary | ICD-10-CM | POA: Diagnosis not present

## 2015-12-28 DIAGNOSIS — I480 Paroxysmal atrial fibrillation: Secondary | ICD-10-CM | POA: Diagnosis not present

## 2015-12-28 DIAGNOSIS — R413 Other amnesia: Secondary | ICD-10-CM | POA: Diagnosis not present

## 2015-12-28 DIAGNOSIS — E559 Vitamin D deficiency, unspecified: Secondary | ICD-10-CM | POA: Diagnosis not present

## 2015-12-28 DIAGNOSIS — Z6823 Body mass index (BMI) 23.0-23.9, adult: Secondary | ICD-10-CM | POA: Diagnosis not present

## 2015-12-28 DIAGNOSIS — Z0000 Encounter for general adult medical examination without abnormal findings: Secondary | ICD-10-CM | POA: Diagnosis not present

## 2015-12-28 DIAGNOSIS — R51 Headache: Secondary | ICD-10-CM | POA: Diagnosis not present

## 2015-12-28 DIAGNOSIS — R2689 Other abnormalities of gait and mobility: Secondary | ICD-10-CM | POA: Diagnosis not present

## 2015-12-28 DIAGNOSIS — E784 Other hyperlipidemia: Secondary | ICD-10-CM | POA: Diagnosis not present

## 2015-12-28 DIAGNOSIS — Z1389 Encounter for screening for other disorder: Secondary | ICD-10-CM | POA: Diagnosis not present

## 2015-12-28 DIAGNOSIS — J45998 Other asthma: Secondary | ICD-10-CM | POA: Diagnosis not present

## 2016-01-05 ENCOUNTER — Encounter (HOSPITAL_COMMUNITY): Payer: PRIVATE HEALTH INSURANCE

## 2016-01-10 ENCOUNTER — Other Ambulatory Visit (HOSPITAL_COMMUNITY): Payer: Self-pay | Admitting: *Deleted

## 2016-01-11 ENCOUNTER — Ambulatory Visit (HOSPITAL_COMMUNITY)
Admission: RE | Admit: 2016-01-11 | Discharge: 2016-01-11 | Disposition: A | Discharge status: Home / Self Care | Payer: Medicare Other | Source: Ambulatory Visit | Attending: Internal Medicine | Admitting: Internal Medicine

## 2016-01-11 DIAGNOSIS — M810 Age-related osteoporosis without current pathological fracture: Secondary | ICD-10-CM | POA: Insufficient documentation

## 2016-01-11 MED ORDER — ZOLEDRONIC ACID 5 MG/100ML IV SOLN
INTRAVENOUS | Status: AC
  Filled 2016-01-11: qty 100

## 2016-01-11 MED ORDER — ZOLEDRONIC ACID 5 MG/100ML IV SOLN
5.0000 mg | Freq: Once | INTRAVENOUS | Status: AC
  Administered 2016-01-11: 5 mg via INTRAVENOUS

## 2016-01-20 ENCOUNTER — Encounter: Payer: Self-pay | Admitting: Internal Medicine

## 2016-01-20 ENCOUNTER — Ambulatory Visit (INDEPENDENT_AMBULATORY_CARE_PROVIDER_SITE_OTHER): Payer: Medicare Other | Admitting: Internal Medicine

## 2016-01-20 VITALS — BP 114/78 | HR 106 | Ht 64.0 in | Wt 128.0 lb

## 2016-01-20 DIAGNOSIS — J45998 Other asthma: Secondary | ICD-10-CM

## 2016-01-20 DIAGNOSIS — J301 Allergic rhinitis due to pollen: Secondary | ICD-10-CM | POA: Diagnosis not present

## 2016-01-20 DIAGNOSIS — I4891 Unspecified atrial fibrillation: Secondary | ICD-10-CM | POA: Diagnosis not present

## 2016-01-20 MED ORDER — IPRATROPIUM BROMIDE 0.03 % NA SOLN: NASAL | Status: AC

## 2016-01-20 NOTE — Assessment & Plan Note (Signed)
Remote paroxysmal atrial fib. She remains on chronic digoxin without recurrence

## 2016-01-20 NOTE — Progress Notes (Signed)
Patient ID: Jodi Burns, female    DOB: 11/14/1925, 82 y.o.   MRN: [id number]  HPI 04/25/11- 43 yoF former smoker followed for asthma complicated by hx PE, Hx AFib, legally blind( Histo retinitis)     Husband here ([name]) Last here April 22, 2010 She fractured her right tibia falling this Spring, delaying intended cardiac stress test pending with [facility] Glenetta Hew, MD. No significant respiratory issues. She rarely uses her rescue inhaler. We discussed reducing her Symbicort to 1 puff twice daily.  Husband asks about assessing her pulmonary status. Denies cough or phlegm, but admits some nasal drip.  We discussed her eyesight/ glaucoma and avoidance of anticholinergics to dry nasal drip.   06/20/11- 71 yoF former smoker followed for asthma complicated by hx PE, Hx AFib, legally blind( Histo retinitis)     Husband here ([name])  Occasional clear nasal discharge never tried Nasalcrom and says the amount of drainage is not bothering her. She feels "fine", better than in a long time. Denies wheeze or shortness of breath. Continues Symbicort. Goes for walks with her husband.  12/19/11- 9 yoF former smoker followed for asthma complicated by hx PE, Hx AFib, legally blind( Histo retinitis)     Husband here ([name])  Well-controlled rhinitis and asthma. Using rescue inhaler not more than once a week. Astepro nasal spray was no help. Symbicort has used twice daily. She walks regularly and notices a little more nasal congestion now in spring pollen season.  01/02/13- 19 yoF former smoker followed for asthma complicated by hx PE, Hx AFib, legally blind( Histo retinitis)     Husband here ([name]) FOLLOWS TGG:YIRSWN any SOB, wheezing, cough, or congestion. Pt is having nasal drainage from pollen increased. Mainly notices dyspnea on exertion but has been doing very well with walks in the park. Her cardiologist attributes dyspnea more to lungs than to  heart. Husband mentions a persistent clear nasal drip but she is not concerned by it. Medications reviewed.  01/15/14- 45 yoF former smoker followed for asthma complicated by hx PE, Hx AFib, legally blind( Histo retinitis)     Husband here ([name]) FOLLOWS FOR: has clear drainage thats ongoing. Denies any SOB or wheezing unless carrying heavy loads or exerting herself too much., She and her husband both say that she is doing "fine" using 1 puff of Symbicort twice daily.  01/19/15- 26 yoF former smoker followed for asthma complicated by hx PE, Hx AFib, legally blind( Histo retinitis)     Husband here ([name]) Reports: nasal drainage constant, wants to get on some nasal spray to help with the drainage Claritin and Flonase have been insufficient for persistent watery rhinorrhea with no real pattern. Little sneezing or itching. Asthma control has been good since they went back up to Symbicort 2 puffs twice daily for the spring season. Not needing rescue inhaler.  01/20/2016-82 year old female former smoker followed for asthma complicated by history PE, PA.fib, legally blind (Histo retinitis) Husband here ([name] retired pediatrician) [place]: Pt states she has been doing well overall; slight drainage without color fron nose. Pt denies any wheezing as well. She has not needed rescue inhaler and is just using 1 puff of Symbicort twice daily. [name] asks about trying to come off of this. Claritin did not help watery clear nasal drip. We we reviewed her previous lack of success with Flonase and Astelin nasal sprays.  Review of Systems- see HPI Constitutional:   No-  weight loss, night sweats, fevers, chills, fatigue, lassitude. HEENT:   No-  headaches, difficulty swallowing, tooth/dental problems, sore throat,       No-  sneezing, itching, ear ache, nasal congestion, +post nasal drip,  CV:  No-   chest pain, orthopnea, PND, swelling in lower extremities, anasarca,  dizziness, palpitations Resp: + shortness of breath with exertion, but she is not very active.Marland Kitchen              No-   productive cough,  No non-productive cough,  No-  coughing up of blood.              No-   change in color of mucus.  No- wheezing.   Skin: No-   rash or lesions. GI:  No-   heartburn, indigestion, abdominal pain, nausea, vomiting,  GU:  MS:  No-   joint pain or swelling.   Neuro- nothing unusual Psych:  No- change in mood or affect. No depression or anxiety.  No memory loss. Ior Objective:   Physical Exam General- Alert, Oriented, Affect-appropriate, Distress- none acute  Slender cheerful Skin- rash-none, lesions- none, excoriation- none Lymphadenopathy- none Head- atraumatic            Eyes- Gross vision -limited to light and dark,  conjunctivae clear secretions            Ears- Hearing, canals normal                          Nose- Clear, No-Septal dev, mucus, polyps, erosion, perforation , + Light sniffing            Throat- Mallampati II , mucosa clear , drainage- none, tonsils- atrophic Neck- flexible , trachea midline, no stridor , thyroid nl, carotid no bruit Chest - symmetrical excursion , unlabored           Heart/CV- nearly RR , no murmur , no gallop  , no rub, nl s1 s2                           - JVD- none , edema- none, stasis changes- none, varices- none           Lung- clear to P&A, wheeze- none, cough- none , dullness-none, rub- none           Chest wall-  Abd-  Br/ Gen/ Rectal- Not done, not indicated Extrem- cyanosis- none, clubbing, none, atrophy- none, strength- nl Neuro- grossly intact to observation

## 2016-01-20 NOTE — Patient Instructions (Signed)
Consider otc Nasalcrom/ cromolyn/ cromol nasal spray     For trial  Script for Atrovent/ ipratropium nasal spray sent  Ok to drop off Symbicort and see how you do without it  Please call as needed

## 2016-01-20 NOTE — Assessment & Plan Note (Signed)
At her age, pollen maybe more of an irritant to a vasomotor rhinitis, rather than a true allergic trigger. Plan-try Atrovent/ipratropium nasal spray

## 2016-01-20 NOTE — Assessment & Plan Note (Signed)
Mild, intermittent, uncomplicated, well controlled Plan-okay to stop Symbicort and see how she does. She has remained so well controlled, that respecting her age and decreased mobility, I don't think the trip to this office is necessarily in her best interest anymore. I suggested she ask her primary physician to renew meds as needed. We will be happy to see her back if we can help.

## 2016-03-22 ENCOUNTER — Other Ambulatory Visit: Payer: Self-pay | Admitting: Internal Medicine

## 2016-03-22 MED ORDER — BUDESONIDE-FORMOTEROL FUMARATE 160-4.5 MCG/ACT IN AERO: 2.0000 | INHALATION_SPRAY | Freq: Two times a day (BID) | RESPIRATORY_TRACT | 11 refills | Status: DC

## 2016-03-28 DIAGNOSIS — E784 Other hyperlipidemia: Secondary | ICD-10-CM | POA: Diagnosis not present

## 2016-05-15 DIAGNOSIS — Z1231 Encounter for screening mammogram for malignant neoplasm of breast: Secondary | ICD-10-CM | POA: Diagnosis not present

## 2016-05-23 ENCOUNTER — Encounter: Payer: Self-pay | Admitting: Obstetrics & Gynecology

## 2016-06-19 ENCOUNTER — Ambulatory Visit (INDEPENDENT_AMBULATORY_CARE_PROVIDER_SITE_OTHER): Payer: Medicare Other | Admitting: Obstetrics & Gynecology

## 2016-06-19 ENCOUNTER — Encounter: Payer: Self-pay | Admitting: Obstetrics & Gynecology

## 2016-06-19 VITALS — BP 130/78 | HR 96 | Resp 14 | Ht 62.0 in | Wt 127.0 lb

## 2016-06-19 DIAGNOSIS — Z01419 Encounter for gynecological examination (general) (routine) without abnormal findings: Secondary | ICD-10-CM

## 2016-06-19 DIAGNOSIS — Z124 Encounter for screening for malignant neoplasm of cervix: Secondary | ICD-10-CM

## 2016-06-19 NOTE — Progress Notes (Signed)
82 y.o. G2P2 MarriedCaucasianF here for annual exam.  Reports she is doing well.  She did have a fall in December and had a scalp/forehead laceration.  Had stiches.  Was seen in the ER.  She is using a cane now.  She reports this has helped her balance a lot.    Denies vaginal bleeding.  PCP:  Dr. Joylene Draft.  Last appt was late last year.  Reports she does have an appt.  Patient's last menstrual period was 08/29/1967.          Sexually active: No.  The current method of family planning is status post hysterectomy.    Exercising: Yes.    walking  Smoker:  no  Health Maintenance: Pap:  2003 negative  History of abnormal Pap:  no MMG: 05/15/16 BIRADS 2 benign  Colonoscopy:  2012  BMD:   12/05/11 Dr. Joylene Draft  TDaP:  Up to date  Pneumonia vaccine(s):  never Zostavax:   never Hep C testing: not indicated  Screening Labs: PCP, Hb today: PCP, Urine today: PCP   reports that she has quit smoking. She has never used smokeless tobacco. She reports that she drinks alcohol. She reports that she does not use drugs.  Past Medical History:  Diagnosis Date  . Allergic rhinitis   . Atrial fibrillation ([place])   . Dyspnea on exertion   . 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 Hyperlipidemia   . Osteopenia   . Other and unspecified hyperlipidemia   . Other retinal disorders(362.89)   . Personal history of other diseases of circulatory system   . Personal history of venous thrombosis and embolism 4/94  . Reactive airway disease   . Subjective visual disturbance, unspecified   . Unspecified asthma(493.90)   . Vitamin D deficiency     Past Surgical History:  Procedure Laterality Date  . APPENDECTOMY    . BREAST BIOPSY    . BREAST BIOPSY     bilateral  . CARDIOVASCULAR STRESS TEST  August 2013   CPET-MET: Submaximal effort, did not reach anaerobic threshold --> peak VO2 76% (not overly concerning for CAD)  . CARPAL TUNNEL RELEASE  2006    right  . PERINEOPLASTY     and rectocele  . Pulmonary Function Test  August 2013   CPET-PFTs: FVC 58%, FEV1 48%; breathing reserve less than 10%, normal DLCO; reduced vital capacity -- suggesting obstructive lung disease  . ROTATOR CUFF REPAIR  7/04, 2003   right, left  . TOTAL ABDOMINAL HYSTERECTOMY    . TRANSTHORACIC ECHOCARDIOGRAM  February 2011   EF 55-60%. Grade 1 diastolic dysfunction/relaxation abnormality. Mild MR    Current Outpatient Prescriptions  Medication Sig Dispense Refill  . acetaminophen (TYLENOL) 325 MG tablet Take 650 mg by mouth every 6 (six) hours as needed for mild pain.     Marland Kitchen albuterol (PROAIR HFA) 108 (90 BASE) MCG/ACT inhaler Inhale 2 puffs into the lungs every 6 (six) hours as needed for wheezing or shortness of breath. 1 Inhaler 11  . budesonide-formoterol (SYMBICORT) 160-4.5 MCG/ACT inhaler Inhale 2 puffs into the lungs 2 (two) times daily. Rinse mouth 10.2 g 11  . Cholecalciferol (VITAMIN D3) 5000 units TABS Take 2 tablets by mouth daily.    . Coenzyme Q10 (COQ10) 100 MG CAPS Take 1 capsule by mouth once.     . cyanocobalamin 1000 MCG tablet Take 2,000 mcg by mouth daily.    . digoxin (LANOXIN)  0.125 MG tablet Take 125 mcg by mouth daily.      . dorzolamide-timolol (COSOPT) 22.3-6.8 MG/ML ophthalmic solution Place 1 drop into both eyes 2 (two) times daily.      Marland Kitchen ipratropium (ATROVENT) 0.03 % nasal spray 1-2 puffs each nostril every 8 hours if needed 30 mL 12  . loratadine (CLARITIN) 10 MG tablet Take 10 mg by mouth daily.    . Multiple Vitamins-Minerals (PRESERVISION/LUTEIN) CAPS Take 1 capsule by mouth 2 (two) times daily.      . simvastatin (ZOCOR) 20 MG tablet Take 20 mg by mouth daily.     No current facility-administered medications for this visit.     Family History  Problem Relation Age of Onset  . Diabetes Paternal Aunt   . Osteoporosis Mother     ROS:  Pertinent items are noted in HPI.  Otherwise, a comprehensive ROS was  negative.  Exam:   LMP 08/29/1967   Weight change: '@WEIGHTCHANGE' @ Height:      Ht Readings from Last 3 Encounters:  01/20/16 '5\' 4"'  (1.626 m)  04/30/15 '5\' 3"'  (1.6 m)  01/19/15 '5\' 4"'  (1.626 m)    General appearance: alert, cooperative and appears stated age Head: Normocephalic, without obvious abnormality, atraumatic Neck: no adenopathy, supple, symmetrical, trachea midline and thyroid normal to inspection and palpation Lungs: clear to auscultation bilaterally Breasts: normal appearance, no masses or tenderness Heart: regular rate and rhythm Abdomen: soft, non-tender; bowel sounds normal; no masses,  no organomegaly Extremities: extremities normal, atraumatic, no cyanosis or edema Skin: Skin color, texture, turgor normal. No rashes or lesions Lymph nodes: Cervical, supraclavicular, and axillary nodes normal. No abnormal inguinal nodes palpated Neurologic: Grossly normal   Pelvic: External genitalia:  no lesions              Urethra:  normal appearing urethra with no masses, tenderness or lesions              Bartholins and Skenes: normal                 Vagina: normal appearing vagina with normal color and discharge, no lesions              Cervix: absent              Pap taken: No. Bimanual Exam:  Uterus:  uterus absent              Adnexa: normal adnexa and no mass, fullness, tenderness               Rectovaginal: Confirms               Anus:  normal sphincter tone, no lesions  Chaperone was present for exam.  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 Joylene Draft yearly.  Reports has upcoming appt. Sees Dr. Annamaria Boots, her pulmonologist every six months Sees Dr. Ellyn Hack, cardiology, yearly.  Hasn't been this year. Return annually or  prn

## 2016-06-26 DIAGNOSIS — R531 Weakness: Secondary | ICD-10-CM | POA: Diagnosis not present

## 2016-06-26 DIAGNOSIS — J45998 Other asthma: Secondary | ICD-10-CM | POA: Diagnosis not present

## 2016-06-26 DIAGNOSIS — R2689 Other abnormalities of gait and mobility: Secondary | ICD-10-CM | POA: Diagnosis not present

## 2016-06-26 DIAGNOSIS — Z6823 Body mass index (BMI) 23.0-23.9, adult: Secondary | ICD-10-CM | POA: Diagnosis not present

## 2016-06-26 DIAGNOSIS — Z23 Encounter for immunization: Secondary | ICD-10-CM | POA: Diagnosis not present

## 2016-06-30 DIAGNOSIS — H401114 Primary open-angle glaucoma, right eye, indeterminate stage: Secondary | ICD-10-CM | POA: Diagnosis not present

## 2016-06-30 DIAGNOSIS — H353114 Nonexudative age-related macular degeneration, right eye, advanced atrophic with subfoveal involvement: Secondary | ICD-10-CM | POA: Diagnosis not present

## 2016-06-30 DIAGNOSIS — H43813 Vitreous degeneration, bilateral: Secondary | ICD-10-CM | POA: Diagnosis not present

## 2016-06-30 DIAGNOSIS — H401124 Primary open-angle glaucoma, left eye, indeterminate stage: Secondary | ICD-10-CM | POA: Diagnosis not present

## 2016-08-18 ENCOUNTER — Other Ambulatory Visit: Payer: Self-pay | Admitting: Internal Medicine

## 2016-08-20 ENCOUNTER — Emergency Department (HOSPITAL_COMMUNITY): Payer: Medicare Other

## 2016-08-20 ENCOUNTER — Encounter (HOSPITAL_COMMUNITY): Payer: Self-pay | Admitting: Emergency Medicine

## 2016-08-20 ENCOUNTER — Inpatient Hospital Stay (HOSPITAL_COMMUNITY)
Admission: EM | Admit: 2016-08-20 | Discharge: 2016-08-31 | DRG: 308 | Disposition: A | Discharge status: Home Health | Payer: Medicare Other | Attending: Internal Medicine | Admitting: Internal Medicine

## 2016-08-20 DIAGNOSIS — M8580 Other specified disorders of bone density and structure, unspecified site: Secondary | ICD-10-CM | POA: Diagnosis present

## 2016-08-20 DIAGNOSIS — I5033 Acute on chronic diastolic (congestive) heart failure: Secondary | ICD-10-CM | POA: Diagnosis not present

## 2016-08-20 DIAGNOSIS — E876 Hypokalemia: Secondary | ICD-10-CM | POA: Diagnosis present

## 2016-08-20 DIAGNOSIS — J9601 Acute respiratory failure with hypoxia: Secondary | ICD-10-CM | POA: Diagnosis not present

## 2016-08-20 DIAGNOSIS — R918 Other nonspecific abnormal finding of lung field: Secondary | ICD-10-CM | POA: Diagnosis present

## 2016-08-20 DIAGNOSIS — I484 Atypical atrial flutter: Secondary | ICD-10-CM | POA: Diagnosis present

## 2016-08-20 DIAGNOSIS — Z7901 Long term (current) use of anticoagulants: Secondary | ICD-10-CM

## 2016-08-20 DIAGNOSIS — J449 Chronic obstructive pulmonary disease, unspecified: Secondary | ICD-10-CM

## 2016-08-20 DIAGNOSIS — R000 Tachycardia, unspecified: Secondary | ICD-10-CM | POA: Diagnosis not present

## 2016-08-20 DIAGNOSIS — Z885 Allergy status to narcotic agent status: Secondary | ICD-10-CM | POA: Diagnosis not present

## 2016-08-20 DIAGNOSIS — Z87891 Personal history of nicotine dependence: Secondary | ICD-10-CM | POA: Diagnosis not present

## 2016-08-20 DIAGNOSIS — I361 Nonrheumatic tricuspid (valve) insufficiency: Secondary | ICD-10-CM | POA: Diagnosis not present

## 2016-08-20 DIAGNOSIS — H548 Legal blindness, as defined in USA: Secondary | ICD-10-CM | POA: Diagnosis present

## 2016-08-20 DIAGNOSIS — Z79899 Other long term (current) drug therapy: Secondary | ICD-10-CM

## 2016-08-20 DIAGNOSIS — I110 Hypertensive heart disease with heart failure: Secondary | ICD-10-CM | POA: Diagnosis present

## 2016-08-20 DIAGNOSIS — R778 Other specified abnormalities of plasma proteins: Secondary | ICD-10-CM | POA: Diagnosis not present

## 2016-08-20 DIAGNOSIS — E785 Hyperlipidemia, unspecified: Secondary | ICD-10-CM | POA: Diagnosis not present

## 2016-08-20 DIAGNOSIS — I482 Chronic atrial fibrillation: Secondary | ICD-10-CM | POA: Diagnosis not present

## 2016-08-20 DIAGNOSIS — I2699 Other pulmonary embolism without acute cor pulmonale: Secondary | ICD-10-CM | POA: Diagnosis not present

## 2016-08-20 DIAGNOSIS — I10 Essential (primary) hypertension: Secondary | ICD-10-CM | POA: Diagnosis present

## 2016-08-20 DIAGNOSIS — R0602 Shortness of breath: Secondary | ICD-10-CM | POA: Diagnosis not present

## 2016-08-20 DIAGNOSIS — E440 Moderate protein-calorie malnutrition: Secondary | ICD-10-CM | POA: Diagnosis present

## 2016-08-20 DIAGNOSIS — R0609 Other forms of dyspnea: Secondary | ICD-10-CM

## 2016-08-20 DIAGNOSIS — I4891 Unspecified atrial fibrillation: Secondary | ICD-10-CM | POA: Diagnosis present

## 2016-08-20 DIAGNOSIS — Z7951 Long term (current) use of inhaled steroids: Secondary | ICD-10-CM

## 2016-08-20 DIAGNOSIS — I480 Paroxysmal atrial fibrillation: Secondary | ICD-10-CM | POA: Diagnosis not present

## 2016-08-20 DIAGNOSIS — R748 Abnormal levels of other serum enzymes: Secondary | ICD-10-CM | POA: Diagnosis not present

## 2016-08-20 DIAGNOSIS — R627 Adult failure to thrive: Secondary | ICD-10-CM | POA: Diagnosis not present

## 2016-08-20 DIAGNOSIS — R2689 Other abnormalities of gait and mobility: Secondary | ICD-10-CM

## 2016-08-20 DIAGNOSIS — R7989 Other specified abnormal findings of blood chemistry: Secondary | ICD-10-CM

## 2016-08-20 DIAGNOSIS — R488 Other symbolic dysfunctions: Secondary | ICD-10-CM | POA: Diagnosis not present

## 2016-08-20 DIAGNOSIS — J301 Allergic rhinitis due to pollen: Secondary | ICD-10-CM | POA: Diagnosis not present

## 2016-08-20 DIAGNOSIS — Z6822 Body mass index (BMI) 22.0-22.9, adult: Secondary | ICD-10-CM

## 2016-08-20 DIAGNOSIS — R269 Unspecified abnormalities of gait and mobility: Secondary | ICD-10-CM | POA: Diagnosis present

## 2016-08-20 DIAGNOSIS — J45909 Unspecified asthma, uncomplicated: Secondary | ICD-10-CM | POA: Diagnosis not present

## 2016-08-20 DIAGNOSIS — Z86718 Personal history of other venous thrombosis and embolism: Secondary | ICD-10-CM | POA: Diagnosis not present

## 2016-08-20 DIAGNOSIS — Z888 Allergy status to other drugs, medicaments and biological substances status: Secondary | ICD-10-CM

## 2016-08-20 DIAGNOSIS — M6281 Muscle weakness (generalized): Secondary | ICD-10-CM | POA: Diagnosis not present

## 2016-08-20 DIAGNOSIS — R531 Weakness: Secondary | ICD-10-CM | POA: Diagnosis not present

## 2016-08-20 DIAGNOSIS — C3411 Malignant neoplasm of upper lobe, right bronchus or lung: Secondary | ICD-10-CM | POA: Diagnosis present

## 2016-08-20 LAB — CBC
HEMATOCRIT: 49.9 % — AB (ref 36.0–46.0)
Hemoglobin: 16.4 g/dL — ABNORMAL HIGH (ref 12.0–15.0)
MCH: 31.1 pg (ref 26.0–34.0)
MCHC: 32.9 g/dL (ref 30.0–36.0)
MCV: 94.5 fL (ref 78.0–100.0)
PLATELETS: 285 10*3/uL (ref 150–400)
RBC: 5.28 MIL/uL — ABNORMAL HIGH (ref 3.87–5.11)
RDW: 14 % (ref 11.5–15.5)
WBC: 11.7 10*3/uL — AB (ref 4.0–10.5)

## 2016-08-20 LAB — BASIC METABOLIC PANEL
Anion gap: 7 (ref 5–15)
BUN: 12 mg/dL (ref 6–20)
CHLORIDE: 103 mmol/L (ref 101–111)
CO2: 29 mmol/L (ref 22–32)
CREATININE: 0.65 mg/dL (ref 0.44–1.00)
Calcium: 9.4 mg/dL (ref 8.9–10.3)
GFR calc Af Amer: >60 mL/min (ref >60)
GFR calc non Af Amer: >60 mL/min (ref >60)
GLUCOSE: 129 mg/dL — AB (ref 65–99)
Potassium: 4 mmol/L (ref 3.5–5.1)
SODIUM: 139 mmol/L (ref 135–145)

## 2016-08-20 LAB — BRAIN NATRIURETIC PEPTIDE: B NATRIURETIC PEPTIDE 5: 201.5 pg/mL — AB (ref 0.0–100.0)

## 2016-08-20 LAB — I-STAT TROPONIN, ED: Troponin i, poc: 0.15 ng/mL (ref 0.00–0.08)

## 2016-08-20 LAB — PHOSPHORUS: PHOSPHORUS: 2.8 mg/dL (ref 2.5–4.6)

## 2016-08-20 LAB — TSH: TSH: 1.97 u[IU]/mL (ref 0.350–4.500)

## 2016-08-20 LAB — MAGNESIUM: Magnesium: 2 mg/dL (ref 1.7–2.4)

## 2016-08-20 LAB — MRSA PCR SCREENING: MRSA BY PCR: NEGATIVE

## 2016-08-20 LAB — DIGOXIN LEVEL: DIGOXIN LVL: 0.3 ng/mL — AB (ref 0.8–2.0)

## 2016-08-20 LAB — TROPONIN I: TROPONIN I: 0.12 ng/mL — AB (ref <0.03)

## 2016-08-20 MED ORDER — SODIUM CHLORIDE 0.9% FLUSH
3.0000 mL | Freq: Two times a day (BID) | INTRAVENOUS | Status: DC
  Administered 2016-08-20 – 2016-08-30 (×17): 3 mL via INTRAVENOUS

## 2016-08-20 MED ORDER — ACETAMINOPHEN 650 MG RE SUPP: 650.0000 mg | Freq: Four times a day (QID) | RECTAL | Status: DC | PRN

## 2016-08-20 MED ORDER — HYDRALAZINE HCL 20 MG/ML IJ SOLN: 10.0000 mg | Freq: Three times a day (TID) | INTRAMUSCULAR | Status: DC | PRN

## 2016-08-20 MED ORDER — TIMOLOL MALEATE 0.5 % OP SOLN
1.0000 [drp] | Freq: Two times a day (BID) | OPHTHALMIC | Status: DC
Start: 2016-08-20 — End: 2016-08-31
  Administered 2016-08-20 – 2016-08-30 (×18): 1 [drp] via OPHTHALMIC
  Filled 2016-08-20: qty 5

## 2016-08-20 MED ORDER — ACETAMINOPHEN 325 MG PO TABS
650.0000 mg | ORAL_TABLET | Freq: Four times a day (QID) | ORAL | Status: DC | PRN
  Administered 2016-08-26 – 2016-08-31 (×2): 650 mg via ORAL
  Filled 2016-08-20 (×2): qty 2

## 2016-08-20 MED ORDER — DORZOLAMIDE HCL 2 % OP SOLN
1.0000 [drp] | Freq: Two times a day (BID) | OPHTHALMIC | Status: DC
  Administered 2016-08-20 – 2016-08-30 (×20): 1 [drp] via OPHTHALMIC
  Filled 2016-08-20: qty 10

## 2016-08-20 MED ORDER — DIGOXIN 0.25 MG/ML IJ SOLN: 0.5000 mg | Freq: Once | INTRAMUSCULAR | Status: DC

## 2016-08-20 MED ORDER — IPRATROPIUM BROMIDE 0.06 % NA SOLN
1.0000 | Freq: Three times a day (TID) | NASAL | Status: DC | PRN
  Filled 2016-08-20: qty 30

## 2016-08-20 MED ORDER — IPRATROPIUM BROMIDE 0.03 % NA SOLN: 2.0000 | Freq: Two times a day (BID) | NASAL | Status: DC

## 2016-08-20 MED ORDER — DIGOXIN 0.25 MG/ML IJ SOLN: 0.2500 mg | Freq: Once | INTRAMUSCULAR | Status: DC

## 2016-08-20 MED ORDER — DILTIAZEM HCL 100 MG IV SOLR
5.0000 mg/h | INTRAVENOUS | Status: DC
  Administered 2016-08-20: 5 mg/h via INTRAVENOUS
  Filled 2016-08-20: qty 100

## 2016-08-20 MED ORDER — ENOXAPARIN SODIUM 40 MG/0.4ML ~~LOC~~ SOLN
40.0000 mg | SUBCUTANEOUS | Status: DC
  Administered 2016-08-20: 40 mg via SUBCUTANEOUS
  Filled 2016-08-20: qty 0.4

## 2016-08-20 MED ORDER — DIGOXIN 125 MCG PO TABS
125.0000 ug | ORAL_TABLET | Freq: Every day | ORAL | Status: DC
  Administered 2016-08-21 – 2016-08-24 (×4): 125 ug via ORAL
  Filled 2016-08-20 (×4): qty 1

## 2016-08-20 MED ORDER — IOPAMIDOL (ISOVUE-370) INJECTION 76%
INTRAVENOUS | Status: AC
  Administered 2016-08-20: 100 mL
  Filled 2016-08-20: qty 100

## 2016-08-20 MED ORDER — SODIUM CHLORIDE 0.9 % IV SOLN
INTRAVENOUS | Status: DC
  Administered 2016-08-20: 20:00:00 via INTRAVENOUS

## 2016-08-20 MED ORDER — DILTIAZEM LOAD VIA INFUSION
10.0000 mg | Freq: Once | INTRAVENOUS | Status: AC
  Administered 2016-08-20: 10 mg via INTRAVENOUS
  Filled 2016-08-20: qty 10

## 2016-08-20 MED ORDER — DORZOLAMIDE HCL-TIMOLOL MAL 2-0.5 % OP SOLN: 1.0000 [drp] | Freq: Two times a day (BID) | OPHTHALMIC | Status: DC

## 2016-08-20 MED ORDER — DILTIAZEM HCL 30 MG PO TABS
30.0000 mg | ORAL_TABLET | Freq: Four times a day (QID) | ORAL | Status: DC
  Administered 2016-08-20 – 2016-08-21 (×2): 30 mg via ORAL
  Filled 2016-08-20 (×5): qty 1

## 2016-08-20 MED ORDER — DIGOXIN 0.25 MG/ML IJ SOLN
0.2500 mg | Freq: Once | INTRAMUSCULAR | Status: AC
  Administered 2016-08-20: 0.25 mg via INTRAVENOUS
  Filled 2016-08-20: qty 2

## 2016-08-20 NOTE — H&P (Signed)
[facility] History and Physical  Jodi Burns WIO:[id number] DOB: 1926-03-25 DOA: 08/20/2016  Referring physician:  PCP: Jerlyn Ly, MD   Chief Complaint: "I was short of breath."  HPI: Jodi Burns is a 82 y.o. female  pmh significant for afib and former smoker who presented with sob. URI 3 wks ago. Gradually weak over last 2 days. Dizziness. Similar episode in past but non in 30 yrs. Pt takes all meds as directed.  ED course: Pt unable to tolerate dilt drip. Cardio consulted. Bolus of digoxin. CT chest showed cancer with likely 2 primary tumors.   Review of Systems:  As per HPI otherwise 10 point review of systems negative.    Past Medical History:  Diagnosis Date  . Allergic rhinitis   . Atrial fibrillation ([place])   . Dyspnea on exertion   . 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 Hyperlipidemia   . Osteopenia   . Other and unspecified hyperlipidemia   . Other retinal disorders(362.89)   . Personal history of other diseases of circulatory system   . Personal history of venous thrombosis and embolism 4/94  . Reactive airway disease   . Subjective visual disturbance, unspecified   . Unspecified asthma(493.90)   . Vitamin D deficiency    Past Surgical History:  Procedure Laterality Date  . APPENDECTOMY    . BREAST BIOPSY    . BREAST BIOPSY     bilateral  . CARDIOVASCULAR STRESS TEST  August 2013   CPET-MET: Submaximal effort, did not reach anaerobic threshold --> peak VO2 76% (not overly concerning for CAD)  . CARPAL TUNNEL RELEASE  2006   right  . PERINEOPLASTY     and rectocele  . Pulmonary Function Test  August 2013   CPET-PFTs: FVC 58%, FEV1 48%; breathing reserve less than 10%, normal DLCO; reduced vital capacity -- suggesting obstructive lung disease  . ROTATOR CUFF REPAIR  7/04, 2003   right, left  . TOTAL ABDOMINAL HYSTERECTOMY    . TRANSTHORACIC ECHOCARDIOGRAM  February  2011   EF 55-60%. Grade 1 diastolic dysfunction/relaxation abnormality. Mild MR   Social History:  reports that she has quit smoking. She has never used smokeless tobacco. She reports that she drinks alcohol. She reports that she does not use drugs.  Allergies  Allergen Reactions  . Cefaclor     REACTION: gi upset  . Codeine Nausea And Vomiting    Family History  Problem Relation Age of Onset  . Diabetes Paternal Aunt   . Osteoporosis Mother      Prior to Admission medications   Medication Sig Start Date End Date Taking? Authorizing Provider  acetaminophen (TYLENOL) 325 MG tablet Take 650 mg by mouth every 6 (six) hours as needed for mild pain.    Yes Historical Provider, MD  albuterol (PROAIR HFA) 108 (90 BASE) MCG/ACT inhaler Inhale 2 puffs into the lungs every 6 (six) hours as needed for wheezing or shortness of breath. 02/18/15 10/21/17 Yes Clinton D Young, MD  budesonide-formoterol (SYMBICORT) 160-4.5 MCG/ACT inhaler Inhale 2 puffs into the lungs 2 (two) times daily. Rinse mouth 03/22/16 04/06/17 Yes Clinton D Young, MD  Cholecalciferol (VITAMIN D3) 5000 units TABS Take 2 tablets by mouth daily.   Yes Historical Provider, MD  Coenzyme Q10 (COQ10) 200 MG CAPS Take 1 capsule by mouth once.    Yes Historical Provider, MD  cyanocobalamin 1000 MCG tablet Take 2,000 mcg by mouth daily.  Yes Historical Provider, MD  digoxin (LANOXIN) 0.125 MG tablet Take 125 mcg by mouth daily.     Yes Historical Provider, MD  dorzolamide-timolol (COSOPT) 22.3-6.8 MG/ML ophthalmic solution Place 1 drop into both eyes 2 (two) times daily.     Yes Historical Provider, MD  fluocinonide (LIDEX) 0.05 % external solution Apply 1 application topically once a week. scalp 07/27/16  Yes Historical Provider, MD  ipratropium (ATROVENT) 0.03 % nasal spray 1-2 puffs each nostril every 8 hours if needed 01/20/16  Yes Deneise Lever, MD  loratadine (CLARITIN) 10 MG tablet Take 10 mg by mouth daily as needed for allergies.     Yes Historical Provider, MD  Multiple Vitamins-Minerals (PRESERVISION/LUTEIN) CAPS Take 1 capsule by mouth 2 (two) times daily.     Yes Historical Provider, MD   Physical Exam: Vitals:   08/20/16 1415 08/20/16 1430 08/20/16 1446 08/20/16 1500  BP: (!) 108/52 (!) 86/73 100/82 117/96  Pulse: (!) 168 88 92 (!) 139  Resp: (!) 30 (!) 27 24 (!) 27  Temp:      TempSrc:      SpO2: 94% 98% 95% 100%    Wt Readings from Last 3 Encounters:  06/19/16 57.6 kg (127 lb)  01/20/16 58.1 kg (128 lb)  04/30/15 58.5 kg (129 lb)    General:  Appears calm and comfortable; A&Ox3 Eyes: PERRL, EOMI, normal lids, iris ENT:  grossly normal hearing, lips & tongue Neck:  no LAD, masses or thyromegaly Cardiovascular:  IRR IRR, murmur. No LE edema.  Respiratory:  CTA bilaterally, no wheeze. Normal respiratory effort. Rales scattered sparsely  Abdomen:  soft, ntnd Skin:  no rash or induration seen on limited exam Musculoskeletal:  grossly normal tone BUE/BLE Psychiatric:  grossly normal mood and affect, speech fluent and appropriate Neurologic:  CN 2-12 grossly intact, moves all extremities in coordinated fashion.          Labs on Admission:  Basic Metabolic Panel:  Recent Labs Lab 08/20/16 1237 08/20/16 1414  NA 139  --   K 4.0  --   CL 103  --   CO2 29  --   GLUCOSE 129*  --   BUN 12  --   CREATININE 0.65  --   CALCIUM 9.4  --   MG  --  2.0  PHOS  --  2.8   Liver Function Tests: No results for input(s): AST, ALT, ALKPHOS, BILITOT, PROT, ALBUMIN in the last 168 hours. No results for input(s): LIPASE, AMYLASE in the last 168 hours. No results for input(s): AMMONIA in the last 168 hours. CBC:  Recent Labs Lab 08/20/16 1237  WBC 11.7*  HGB 16.4*  HCT 49.9*  MCV 94.5  PLT 285   Cardiac Enzymes: No results for input(s): CKTOTAL, CKMB, CKMBINDEX, TROPONINI in the last 168 hours.  BNP (last 3 results)  Recent Labs  08/20/16 1414  BNP 201.5*    ProBNP (last 3 results) No  results for input(s): PROBNP in the last 8760 hours.   Creatinine clearance cannot be calculated (Unknown ideal weight.)  CBG: No results for input(s): GLUCAP in the last 168 hours.  Radiological Exams on Admission: Dg Chest 2 View  Result Date: 08/20/2016 CLINICAL DATA:  Shortness of breath, weakness and dizziness since yesterday. EXAM: CHEST  2 VIEW COMPARISON:  PA and lateral chest 03/28/2007. FINDINGS: The patient has a new mass in the right upper lobe measuring 3.2 cm transverse by 4.2 cm craniocaudal by 4.4 cm AP. The lungs  appear emphysematous. Heart size is upper normal. Aorta atherosclerosis is noted. 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 Emphysema. Electronically Signed   By: Inge Rise M.D.   On: 08/20/2016 13:14   Ct Angio Chest Pe W And/or Wo Contrast  Result Date: 08/20/2016 CLINICAL DATA:  Shortness of breath.  Rule out pulmonary embolism. EXAM: CT ANGIOGRAPHY CHEST WITH CONTRAST TECHNIQUE: Multidetector CT imaging of the chest was performed using the standard protocol during bolus administration of intravenous contrast. Multiplanar CT image reconstructions and MIPs were obtained to evaluate the vascular anatomy. CONTRAST:  100 cc of Isovue 370 COMPARISON:  Chest radiograph of earlier today.  No prior CT. FINDINGS: Cardiovascular: The quality of this exam for evaluation of pulmonary embolism is good. No evidence of pulmonary embolism. Mild cardiomegaly, with LAD coronary artery atherosclerosis. Advanced aortic and branch vessel atherosclerosis. Tortuous thoracic aorta. Mediastinum/Nodes: No mediastinal or hilar adenopathy. Lungs/Pleura: No pleural fluid. Lower lobe predominant bronchial wall thickening. Mild centrilobular emphysema. Right upper lobe lung mass, 3.4 x 4.0 cm on image 24/series 407. 3.9 cm craniocaudal. 4 mm right lower lobe pulmonary nodule on image 92/series 407. Spiculated left upper lobe density measures 1.2 x 0.8 cm on image  23/series 4 and 7. Vague 5 mm left lower lobe pulmonary nodule on image 63/series 47. Upper Abdomen: Old granulomatous disease in the liver and spleen. Normal imaged portions of the stomach, pancreas, gallbladder, adrenal glands, kidneys. Abdominal aortic atherosclerosis. Musculoskeletal: Left rotator cuff repair. Review of the MIP images confirms the above findings. IMPRESSION: 1.  No evidence of pulmonary embolism. 2. Right upper lobe lung mass, consistent with primary bronchogenic carcinoma. No thoracic adenopathy. 3. Irregular left upper lobe opacity could represent a synchronous primary bronchogenic carcinoma. A focus of infection is felt less likely. 4. Scattered nonspecific smaller pulmonary nodules. 5.  Coronary artery atherosclerosis. Aortic atherosclerosis. Electronically Signed   By: Abigail Miyamoto M.D.   On: 08/20/2016 16:18    EKG: Independently reviewed. Ventricular rate 141, QRS 78, QTC 407, atrial fibrillation with RVR-No acute ST changes noted  Assessment/Plan Principal Problem:   Atrial fibrillation with RVR (HCC) Active Problems:   Hyperlipidemia with target LDL less than 130   Allergic rhinitis due to pollen   Essential hypertension   Lung mass  Afib with RVR Loading dose of dig in ED, one 238mg dose due to potential risk of toxicity, level was low Resume oral tomorrow Oral cardizem per recs from cardio Cardio onboards, recs appreciated Ua pending  Lung Mass Pulm consult in the AM  COPD Holding symbicort  Allergies Atrovent nasal spray bid Holding claritin  Glaucoma Resume cosopt  Code Status: FULL  DVT Prophylaxis: Lovenox Family Communication: Husband at bedside (retired PLexicographer Disposition Plan: Pending Improvement  Status: Inpt SDU  PElwin Mocha MD Family Medicine [facility] [email or web address] Password [email or web address]

## 2016-08-20 NOTE — Consult Note (Signed)
Requesting provider: Hardie Lora, PA-C Primary cardiologist: Dr. Glenetta Hew Consulting cardiologist: Dr. Satira Sark  Reason for consultation: Atrial fibrillation  Clinical Summary Ms. Demarinis is an 82 y.o.female with past medical history outlined below, presenting to the ER with her husband this evening complaining of progressive fatigue and shortness of breath with cough. Her husband is a retired Lexicographer, tells me that he has noticed his wife to have somewhat of a gradual decline over the last year, but in particular in the last few days she has been more weak than normal, also had an irregular pulse. They both have gotten over a recent URI, she has had a cough productive of clear sputum, no obvious fevers or chills. She does not report feeling any sense of rapid heart rate or palpitations. Assessment in the ER finds rapid atrial fibrillation, absolute duration is not entirely clear. She follows with Dr. Ellyn Hack with known history of PAF, with last seen in March 2016. She has been on Lanoxin for several years and has done well without major breakthrough. She has not been anticoagulated. CHADSVASC score is 4.  She was placed on intravenous diltiazem i.e. are following a bolus dose, had better control of her heart rate although developed hypotension with resulting discontinuation of the infusion. Blood pressure has now come back up, but so has her heart rate. She is in no distress, does not feel palpitations.  Unfortunately, imaging of the chest shows evidence of a right upper lobe mass concerning for bronchogenic carcinoma. I spoke with the patient's husband about these findings, we looked at the CT image and he was able to read the report as well.   Allergies  Allergen Reactions  . Cefaclor     REACTION: gi upset  . Codeine Nausea And Vomiting    Home Medications No current facility-administered medications on file prior to encounter.    Current Outpatient  Prescriptions on File Prior to Encounter  Medication Sig Dispense Refill  . acetaminophen (TYLENOL) 325 MG tablet Take 650 mg by mouth every 6 (six) hours as needed for mild pain.     Marland Kitchen albuterol (PROAIR HFA) 108 (90 BASE) MCG/ACT inhaler Inhale 2 puffs into the lungs every 6 (six) hours as needed for wheezing or shortness of breath. 1 Inhaler 11  . budesonide-formoterol (SYMBICORT) 160-4.5 MCG/ACT inhaler Inhale 2 puffs into the lungs 2 (two) times daily. Rinse mouth 10.2 g 11  . Cholecalciferol (VITAMIN D3) 5000 units TABS Take 2 tablets by mouth daily.    . Coenzyme Q10 (COQ10) 200 MG CAPS Take 1 capsule by mouth once.     . cyanocobalamin 1000 MCG tablet Take 2,000 mcg by mouth daily.    . digoxin (LANOXIN) 0.125 MG tablet Take 125 mcg by mouth daily.      . dorzolamide-timolol (COSOPT) 22.3-6.8 MG/ML ophthalmic solution Place 1 drop into both eyes 2 (two) times daily.      Marland Kitchen ipratropium (ATROVENT) 0.03 % nasal spray 1-2 puffs each nostril every 8 hours if needed 30 mL 12  . loratadine (CLARITIN) 10 MG tablet Take 10 mg by mouth daily as needed for allergies.     . Multiple Vitamins-Minerals (PRESERVISION/LUTEIN) CAPS Take 1 capsule by mouth 2 (two) times daily.         Past Medical History:  Diagnosis Date  . Allergic rhinitis   . Atrial fibrillation ([place])   . Dyspnea on exertion   . History of histoplasmosis    affecting her bilateral retinae  leading to her being legally blind  . Hyperlipidemia   . Osteopenia   . Other and unspecified hyperlipidemia   . Other retinal disorders(362.89)   . Personal history of other diseases of circulatory system   . Personal history of venous thrombosis and embolism 4/94  . Reactive airway disease   . Subjective visual disturbance, unspecified   . Unspecified asthma(493.90)   . Vitamin D deficiency     Past Surgical History:  Procedure Laterality Date  . APPENDECTOMY    . BREAST BIOPSY    . BREAST BIOPSY     bilateral  . CARDIOVASCULAR  STRESS TEST  August 2013   CPET-MET: Submaximal effort, did not reach anaerobic threshold --> peak VO2 76% (not overly concerning for CAD)  . CARPAL TUNNEL RELEASE  2006   right  . PERINEOPLASTY     and rectocele  . Pulmonary Function Test  August 2013   CPET-PFTs: FVC 58%, FEV1 48%; breathing reserve less than 10%, normal DLCO; reduced vital capacity -- suggesting obstructive lung disease  . ROTATOR CUFF REPAIR  7/04, 2003   right, left  . TOTAL ABDOMINAL HYSTERECTOMY    . TRANSTHORACIC ECHOCARDIOGRAM  February 2011   EF 55-60%. Grade 1 diastolic dysfunction/relaxation abnormality. Mild MR    Family History  Problem Relation Age of Onset  . Diabetes Paternal Aunt   . Osteoporosis Mother     Social History Ms. Mckendry reports that she has quit smoking. She has never used smokeless tobacco. Ms. Imel reports that she drinks alcohol.  Review of Systems Complete review of systems negative except as otherwise outlined in the clinical summary and also the following. Diminished hearing, progressive fatigue, limited appetite.  Physical Examination Blood pressure 117/96, pulse (!) 139, temperature 97.2 F (36.2 C), temperature source Oral, resp. rate (!) 27, last menstrual period 08/29/1967, SpO2 100 %.  Telemetry: Rapid atrial fibrillation.  Gen.: Elderly woman, no distress. HEENT: Conjunctiva and lids normal, oropharynx clear. Neck: Supple, no elevated JVP or carotid bruits, no thyromegaly. Lungs: No wheezing or rhonchi, nonlabored breathing at rest. Cardiac: Rapid, irregularly irregular, soft systolic murmur, no pericardial rub. Abdomen: Soft, nontender, bowel sounds present, no guarding or rebound. Extremities: No pitting edema, distal pulses 1-2+. Skin: Warm and dry. Musculoskeletal: Mild kyphosis. Neuropsychiatric: Alert and oriented x3, affect grossly appropriate.   Lab Results  Basic Metabolic Panel:  Recent Labs Lab 08/20/16 1237 08/20/16 1414  NA 139  --    K 4.0  --   CL 103  --   CO2 29  --   GLUCOSE 129*  --   BUN 12  --   CREATININE 0.65  --   CALCIUM 9.4  --   MG  --  2.0    CBC:  Recent Labs Lab 08/20/16 1237  WBC 11.7*  HGB 16.4*  HCT 49.9*  MCV 94.5  PLT 285    Cardiac Enzymes: POC troponin I 0.15  BNP: 202  ECG I personally reviewed the tracing from 08/20/2016 which shows rapid atrial fibrillation with increased voltage, left anterior fascicular block, and decreased only progression.  Imaging  Chest x-ray 08/20/2016: FINDINGS: The patient has a new mass in the right upper lobe measuring 3.2 cm transverse by 4.2 cm craniocaudal by 4.4 cm AP. The lungs appear emphysematous. Heart size is upper normal. Aorta atherosclerosis is noted.  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  Emphysema.  Chest CT 08/20/2016: FINDINGS: Cardiovascular: The quality of this exam for evaluation of  pulmonary embolism is good. No evidence of pulmonary embolism.  Mild cardiomegaly, with LAD coronary artery atherosclerosis. Advanced aortic and branch vessel atherosclerosis. Tortuous thoracic aorta.  Mediastinum/Nodes: No mediastinal or hilar adenopathy.  Lungs/Pleura: No pleural fluid. Lower lobe predominant bronchial wall thickening. Mild centrilobular emphysema.  Right upper lobe lung mass, 3.4 x 4.0 cm on image 24/series 407. 3.9 cm craniocaudal.  4 mm right lower lobe pulmonary nodule on image 92/series 407.  Spiculated left upper lobe density measures 1.2 x 0.8 cm on image 23/series 4 and 7.  Vague 5 mm left lower lobe pulmonary nodule on image 63/series 47.  Upper Abdomen: Old granulomatous disease in the liver and spleen. Normal imaged portions of the stomach, pancreas, gallbladder, adrenal glands, kidneys. Abdominal aortic atherosclerosis.  Musculoskeletal: Left rotator cuff repair.  Review of the MIP images confirms the above findings.  IMPRESSION: 1.  No evidence  of pulmonary embolism. 2. Right upper lobe lung mass, consistent with primary bronchogenic carcinoma. No thoracic adenopathy. 3. Irregular left upper lobe opacity could represent a synchronous primary bronchogenic carcinoma. A focus of infection is felt less likely. 4. Scattered nonspecific smaller pulmonary nodules. 5.  Coronary artery atherosclerosis. Aortic atherosclerosis.  Impression  1. Atrial fibrillation with rapid ventricular response, onset potentially within the last few days based on worsening weakness and shortness of breath, although she is not specifically aware of palpitations. She has a history of PAF and has been on Lanoxin long-term, followed by Dr. Ellyn Hack. CHASDVASC score is 4, she has not been anticoagulated long-term. Had transient hypotension with intravenous diltiazem. LVEF 55-60% range by echocardiogram in 2011.  2. Right upper lobe lung mass concerning for bronchogenic carcinoma, CT imaging outlined above. This abnormality is newly documented, she has had a mildly productive cough and shortness of breath. I discussed these findings with the patient's husband and we looked at the CT imaging and report. She is a former smoker, but not for many years.  3. Hyperlipidemia, on Zocor.  Recommendations  Patient being admitted to the hospitalist service for further workup. Recommend starting Cardizem CD 30 mg by mouth every 6 hours for heart rate control, she may tolerate this better than the intravenous form. She is also on Lanoxin, a load could be given if additional heart rate control is needed (digoxin level 0.3). When heart rate control is better achieved, would follow-up with echocardiogram as this may help to further guide medical therapy adjustment. Further workup of suspected bronchogenic carcinoma per primary team with involvement of Pulmonary.  Satira Sark, M.D., F.A.C.C.

## 2016-08-20 NOTE — ED Triage Notes (Signed)
Pt here with increased weakness and possible irregular HR per husband starting last night; pt recovering from sore throat

## 2016-08-20 NOTE — ED Provider Notes (Signed)
[facility] DEPT Provider Note   CSN: [id number] Arrival date & time: 08/20/16  1219  By signing my name below, I, Jodi Burns, attest that this documentation has been prepared under the direction and in the presence of non-physician practitioner, Margarita Mail, PA-C. Electronically Signed: Dolores Burns, Scribe. 08/20/2016. 1:19 PM.  History   Chief Complaint Chief Complaint  Patient presents with  . Weakness   The history is provided by the patient and the spouse. No language interpreter was used.    HPI Comments:  Jodi Burns is a 82 y.o. female with pmhx of Afib and HLD who presents to the Emergency Department complaining of gradual onset constant weakness beginning yesterday. Pt's husband is a pediatrician and states that he became concerned for his wife's health last night after she had a dizzy spell, developed SOB and an irregular heart rate and rhythm. She states that she has had similar symptoms in the past, 30 years ago, but her symptoms resolved on their own. Pt also reports associated SOB, wheezes and productive coughs but notes that she has had a mild URI over the past 3 weeks. Pt has been using Symbacort and albuterol with some relief. She denies any abdominal pain, nausea, vomiting or chills. She also denies any history of MI. Pt is compliant with .125 of digoxin daily and is not taking any blood thinners. Pt's husband states that he has noticed his wife's general health in decline over the past couple months.  Past Medical History:  Diagnosis Date  . Allergic rhinitis   . Atrial fibrillation ([place])   . Dyspnea on exertion   . 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 Hyperlipidemia   . Osteopenia   . Other and unspecified hyperlipidemia   . Other retinal disorders(362.89)   . Personal history of other diseases of circulatory system   . Personal history of venous thrombosis and embolism 4/94  .  Reactive airway disease   . Subjective visual disturbance, unspecified   . Unspecified asthma(493.90)   . Vitamin D deficiency     Patient Active Problem List   Diagnosis Date Noted  . DOE (dyspnea on exertion) 11/01/2013  . Essential hypertension 11/01/2013  . Abnormal PFTs (pulmonary function tests) 11/01/2013  . Hyperlipidemia with target LDL less than 130 05/19/2008  . VISUAL IMPAIRMENT 05/19/2008  . Allergic rhinitis due to pollen 05/19/2008  . 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 PULMONARY EMBOLISM, HX OF 05/19/2008  . Atrial fibrillation, unspecified 05/19/2008    Past Surgical History:  Procedure Laterality Date  . APPENDECTOMY    . BREAST BIOPSY    . BREAST BIOPSY     bilateral  . CARDIOVASCULAR STRESS TEST  August 2013   CPET-MET: Submaximal effort, did not reach anaerobic threshold --> peak VO2 76% (not overly concerning for CAD)  . CARPAL TUNNEL RELEASE  2006   right  . PERINEOPLASTY     and rectocele  . Pulmonary Function Test  August 2013   CPET-PFTs: FVC 58%, FEV1 48%; breathing reserve less than 10%, normal DLCO; reduced vital capacity -- suggesting obstructive lung disease  . ROTATOR CUFF REPAIR  7/04, 2003   right, left  . TOTAL ABDOMINAL HYSTERECTOMY    . TRANSTHORACIC ECHOCARDIOGRAM  February 2011   EF 55-60%. Grade 1 diastolic dysfunction/relaxation abnormality. Mild MR    OB History    Gravida Para Term Preterm AB Living   2 2  2   SAB TAB Ectopic Multiple Live Births                   Home Medications    Prior to Admission medications   Medication Sig Start Date End Date Taking? Authorizing Provider  acetaminophen (TYLENOL) 325 MG tablet Take 650 mg by mouth every 6 (six) hours as needed for mild pain.     Historical Provider, MD  albuterol (PROAIR HFA) 108 (90 BASE) MCG/ACT inhaler Inhale 2 puffs into the lungs every 6 (six) hours as needed for wheezing or shortness of breath. 02/18/15 10/21/17  Deneise Lever, MD    budesonide-formoterol (SYMBICORT) 160-4.5 MCG/ACT inhaler Inhale 2 puffs into the lungs 2 (two) times daily. Rinse mouth 03/22/16 04/06/17  Deneise Lever, MD  Cholecalciferol (VITAMIN D3) 5000 units TABS Take 2 tablets by mouth daily.    Historical Provider, MD  Coenzyme Q10 (COQ10) 100 MG CAPS Take 1 capsule by mouth once.     Historical Provider, MD  cyanocobalamin 1000 MCG tablet Take 2,000 mcg by mouth daily.    Historical Provider, MD  digoxin (LANOXIN) 0.125 MG tablet Take 125 mcg by mouth daily.      Historical Provider, MD  dorzolamide-timolol (COSOPT) 22.3-6.8 MG/ML ophthalmic solution Place 1 drop into both eyes 2 (two) times daily.      Historical Provider, MD  ipratropium (ATROVENT) 0.03 % nasal spray 1-2 puffs each nostril every 8 hours if needed 01/20/16   Deneise Lever, MD  loratadine (CLARITIN) 10 MG tablet Take 10 mg by mouth daily.    Historical Provider, MD  Multiple Vitamins-Minerals (PRESERVISION/LUTEIN) CAPS Take 1 capsule by mouth 2 (two) times daily.      Historical Provider, MD  PROAIR HFA [phone] Base) MCG/ACT inhaler inhale 2 puffs by mouth every 6 hours if needed for wheezing or shortness of breath 08/18/16   Deneise Lever, MD  simvastatin (ZOCOR) 20 MG tablet Take 20 mg by mouth daily.    Historical Provider, MD    Family History Family History  Problem Relation Age of Onset  . Diabetes Paternal Aunt   . Osteoporosis Mother     Social History Social History  Substance Use Topics  . Smoking status: Former Research scientist (life sciences)  . Smokeless tobacco: Never Used     Comment: quit 17 years ago  . Alcohol use Yes     Comment: high ball prior to dinner     Allergies   Cefaclor and Codeine   Review of Systems Review of Systems 10 Systems reviewed and are negative for acute change except as noted in the HPI.  Physical Exam Updated Vital Signs BP 131/76   Pulse (!) 143   Temp 97.2 F (36.2 C) (Oral)   Resp 23   LMP 08/29/1967   SpO2 97%   Physical Exam   Constitutional: She is oriented to person, place, and time. She appears well-developed and well-nourished. No distress.  HENT:  Head: Normocephalic and atraumatic.  Eyes: EOM are normal.  Neck: Normal range of motion.  Cardiovascular: Normal heart sounds.  An irregularly irregular rhythm present. Exam reveals no gallop and no friction rub.   No murmur heard. No murmur. No rub. No gallop.  Pulmonary/Chest: Effort normal and breath sounds normal.  Abdominal: Soft. She exhibits no distension. There is no tenderness.  Musculoskeletal: Normal range of motion.  Neurological: She is alert and oriented to person, place, and time.  Skin: Skin is warm and dry.  Psychiatric: She has a normal mood and affect. Judgment normal.  Nursing note and vitals reviewed.    ED Treatments / Results  DIAGNOSTIC STUDIES:  Oxygen Saturation is 95% on RA, adequate by my interpretation.    COORDINATION OF CARE:  1:39 PM Discussed treatment plan with pt at bedside which includes blood work and imaging and pt agreed to plan.  2:11 PM Spoke with patient again concerning CT scan to rule out pulmonary embolism. Discussed lung mass with patient/ family and reviewed images.  3:48 PM Consult with Dr. Duard Brady about pt's heart rate.    Labs (all labs ordered are listed, but only abnormal results are displayed) Labs Reviewed  BASIC METABOLIC PANEL - Abnormal; Notable for the following:       Result Value   Glucose, Bld 129 (*)    All other components within normal limits  CBC - Abnormal; Notable for the following:    WBC 11.7 (*)    RBC 5.28 (*)    Hemoglobin 16.4 (*)    HCT 49.9 (*)    All other components within normal limits  I-STAT TROPOININ, ED - Abnormal; Notable for the following:    Troponin i, poc 0.15 (*)    All other components within normal limits    EKG  EKG Interpretation None       Radiology Dg Chest 2 View  Result Date: 08/20/2016 CLINICAL DATA:  Shortness of breath, weakness  and dizziness since yesterday. EXAM: CHEST  2 VIEW COMPARISON:  PA and lateral chest 03/28/2007. FINDINGS: The patient has a new mass in the right upper lobe measuring 3.2 cm transverse by 4.2 cm craniocaudal by 4.4 cm AP. The lungs appear emphysematous. Heart size is upper normal. Aorta atherosclerosis is noted. 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 Emphysema. Electronically Signed   By: Inge Rise M.D.   On: 08/20/2016 13:14    Procedures Procedures (including critical care time)  Medications Ordered in ED Medications - No data to display   Initial Impression / Assessment and Plan / ED Course  I have reviewed the triage vital signs and the nursing notes.  Pertinent labs & imaging results that were available during my care of the patient were reviewed by me and considered in my medical decision making (see chart for details).  Clinical Course as of Aug 20 1632  Nancy Fetter Aug 20, 2016  1605 CT Angio Chest PE W and/or Wo Contrast [AH]    Clinical Course User Index [AH] Margarita Mail, PA-C     Final Clinical Impressions(s) / ED Diagnoses   Final diagnoses:  Atrial fibrillation with RVR ([place])  Lung mass   Patient with a persistent A. fib with RVR. She was given diltiazem bolus and started on an infusion. However, her pressures dropped significantly and it was stopped. Her dig. Level is low. Troponin is elevated, however, I feel that this may be secondary to the atrial fibrillation. We'll trend her troponins. I have spoken with Dr. Domenic Polite who will see the patient. Patient seen in shared visit with Dr. Sherry Ruffing. The patient has a notable lung mass on chest x-ray. I discussed this with the patient in the family. We'll obtain a CT angios of PE to rule out pulmonary embolus and have better characterization of the mass seen on chest x-ray. Patient will be admitted by tried hospitalist. She is stable throughout visit without significant decompensation in her vital  signs.  New Prescriptions New Prescriptions   No medications on  file  I personally performed the services described in this documentation, which was scribed in my presence. The recorded information has been reviewed and is accurate.       Margarita Mail, PA-C 08/20/16 [place], MD 08/21/16 [phone]

## 2016-08-21 DIAGNOSIS — R0609 Other forms of dyspnea: Secondary | ICD-10-CM

## 2016-08-21 DIAGNOSIS — I480 Paroxysmal atrial fibrillation: Principal | ICD-10-CM

## 2016-08-21 DIAGNOSIS — R748 Abnormal levels of other serum enzymes: Secondary | ICD-10-CM

## 2016-08-21 DIAGNOSIS — R778 Other specified abnormalities of plasma proteins: Secondary | ICD-10-CM | POA: Diagnosis not present

## 2016-08-21 DIAGNOSIS — J449 Chronic obstructive pulmonary disease, unspecified: Secondary | ICD-10-CM

## 2016-08-21 DIAGNOSIS — R7989 Other specified abnormal findings of blood chemistry: Secondary | ICD-10-CM

## 2016-08-21 LAB — CBC
HCT: 41.8 % (ref 36.0–46.0)
Hemoglobin: 13.5 g/dL (ref 12.0–15.0)
MCH: 30.3 pg (ref 26.0–34.0)
MCHC: 32.3 g/dL (ref 30.0–36.0)
MCV: 93.9 fL (ref 78.0–100.0)
PLATELETS: 258 10*3/uL (ref 150–400)
RBC: 4.45 MIL/uL (ref 3.87–5.11)
RDW: 14.1 % (ref 11.5–15.5)
WBC: 10.6 10*3/uL — AB (ref 4.0–10.5)

## 2016-08-21 LAB — URINALYSIS, ROUTINE W REFLEX MICROSCOPIC
Bacteria, UA: NONE SEEN
Bilirubin Urine: NEGATIVE
GLUCOSE, UA: NEGATIVE mg/dL
HGB URINE DIPSTICK: NEGATIVE
Ketones, ur: NEGATIVE mg/dL
NITRITE: NEGATIVE
Protein, ur: NEGATIVE mg/dL
SPECIFIC GRAVITY, URINE: 1.031 — AB (ref 1.005–1.030)
pH: 5 (ref 5.0–8.0)

## 2016-08-21 LAB — BASIC METABOLIC PANEL
Anion gap: 7 (ref 5–15)
BUN: 10 mg/dL (ref 6–20)
CHLORIDE: 108 mmol/L (ref 101–111)
CO2: 26 mmol/L (ref 22–32)
CREATININE: 0.64 mg/dL (ref 0.44–1.00)
Calcium: 8.4 mg/dL — ABNORMAL LOW (ref 8.9–10.3)
GFR calc Af Amer: >60 mL/min (ref >60)
GFR calc non Af Amer: >60 mL/min (ref >60)
GLUCOSE: 108 mg/dL — AB (ref 65–99)
Potassium: 3.6 mmol/L (ref 3.5–5.1)
SODIUM: 141 mmol/L (ref 135–145)

## 2016-08-21 MED ORDER — MOMETASONE FURO-FORMOTEROL FUM 200-5 MCG/ACT IN AERO
2.0000 | INHALATION_SPRAY | Freq: Two times a day (BID) | RESPIRATORY_TRACT | Status: DC
  Administered 2016-08-21 – 2016-08-31 (×21): 2 via RESPIRATORY_TRACT
  Filled 2016-08-21 (×2): qty 8.8

## 2016-08-21 MED ORDER — DILTIAZEM HCL 60 MG PO TABS
60.0000 mg | ORAL_TABLET | Freq: Three times a day (TID) | ORAL | Status: DC
  Administered 2016-08-21 – 2016-08-22 (×3): 60 mg via ORAL
  Filled 2016-08-21 (×3): qty 1

## 2016-08-21 MED ORDER — APIXABAN 2.5 MG PO TABS
2.5000 mg | ORAL_TABLET | Freq: Two times a day (BID) | ORAL | Status: DC
  Administered 2016-08-21 – 2016-08-31 (×20): 2.5 mg via ORAL
  Filled 2016-08-21 (×20): qty 1

## 2016-08-21 NOTE — Progress Notes (Signed)
ANTICOAGULATION CONSULT NOTE - Initial Consult  Pharmacy Consult for Eliquis Indication: atrial fibrillation  Allergies  Allergen Reactions  . Cefaclor     REACTION: gi upset  . Codeine Nausea And Vomiting    Patient Measurements: Weight: 125 lb 3.5 oz (56.Jodi kg)  Vital Signs: Temp: 97.4 Burns (36.3 C) (12/25 0755) Temp Source: Oral (12/25 0755) BP: 130/103 (12/25 0912) Pulse Rate: 93 (12/25 0912)  Labs:  Recent Labs  08/20/16 1237 08/20/16 1659 08/21/16 0247  HGB 16.4*  --  13.5  HCT 49.9*  --  41.Jodi  PLT 285  --  258  CREATININE 0.65  --  0.64  TROPONINI  --  0.12*  --     Estimated Creatinine Clearance: 39.2 mL/min (by C-G formula based on SCr of 0.64 mg/dL).   Medical History: Past Medical History:  Diagnosis Date  . Allergic rhinitis   . Atrial fibrillation ([place])   . Dyspnea on exertion   . 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 Hyperlipidemia   . Osteopenia   . Other and unspecified hyperlipidemia   . Other retinal disorders(362.89)   . Personal history of other diseases of circulatory system   . Personal history of venous thrombosis and embolism 4/94  . Reactive airway disease   . Subjective visual disturbance, unspecified   . Unspecified asthma(493.90)   . Vitamin D deficiency     Medications:  Scheduled:  . apixaban  2.5 mg Oral BID  . digoxin  125 mcg Oral Daily  . diltiazem  60 mg Oral Q8H  . dorzolamide  1 drop Both Eyes BID  . mometasone-formoterol  2 puff Inhalation BID  . sodium chloride flush  3 mL Intravenous Q12H  . timolol  1 drop Both Eyes BID   Infusions:    Assessment: 82 yo Burns admitted on 08/20/2016 with progressive dyspnea and ?URI symptoms noted to be in afib with RVR. Of note, patient has a h/o of afib but not anticoagulated 2/2 last cards clinic visit 10/2014 with no sense of recurrence. Now with recurrence and pharmacy to dose Eliquis with CHADSVASc of 4. CBC wnl  and no bleeding issues reported. Since she is > 75 yo and wt < 60 kg, she qualifies for lower dose.   Goal of Therapy:  Monitor platelets by anticoagulation protocol: Yes   Plan:  - D/c DVT dosing Lovenox and start Eliquis 2.5 mg PO BID - Monitor CBC and monitor for bleeding  [name] K. Velva Harman, PharmD, BCPS, CPP Clinical Pharmacist Pager: [phone] Phone: [phone] 08/21/2016 11:37 AM

## 2016-08-21 NOTE — Progress Notes (Signed)
Patient Name: Jodi Burns Date of Encounter: 08/21/2016  Primary Cardiologist: Glenetta Hew, MD   Patient Profile     82 y/o woman with prior h/o PAF (previously controlled with Digoxin - last seen in clinic 10/2014 with no sense of recurrence). She was admitted with progressive dyspnea & ? URI Sx associated with dizziness.   Admitted to [facility] service for management.  -- was noted to be in Afib RVR (started on PO Diltiazem b/c BP did not tolerate IV) & given loading dose of IV Digoxin. She had not been anticoagulated. CHADSVASC score is 4. Was noted to have lung mass - seen by Pulm - expect OP evaluation.  Unsure of timing of Afib onset - likely within a few days of admission.  Hospital Problem List     Principal Problem:   DOE (dyspnea on exertion) Active Problems:   PAF (paroxysmal atrial fibrillation) (HCC)   Atrial fibrillation with RVR (H[facility])   Hyperlipidemia with target LDL less than 130   Essential hypertension   Allergic rhinitis due to pollen   Lung mass   Subjective   Feels better; breathing improved. Stronger.  Still not up & around  Inpatient Medications    Scheduled Meds: . digoxin  125 mcg Oral Daily  . diltiazem  30 mg Oral Q6H  . dorzolamide  1 drop Both Eyes BID  . enoxaparin (LOVENOX) injection  40 mg Subcutaneous Q24H  . mometasone-formoterol  2 puff Inhalation BID  . sodium chloride flush  3 mL Intravenous Q12H  . timolol  1 drop Both Eyes BID   Continuous Infusions:  PRN Meds: acetaminophen **OR** acetaminophen, hydrALAZINE, ipratropium   Vital Signs    Vitals:   08/21/16 0313 08/21/16 0346 08/21/16 0755 08/21/16 0912  BP:  131/71 139/65 (!) 130/103  Pulse: (!) 108 (!) 106 (!) 119 93  Resp: (!) 24 (!) 25 (!) 25 17  Temp: 98 F (36.7 C)  97.4 F (36.3 C)   TempSrc: Oral  Oral   SpO2: 97% 97% 98% 99%  Weight: 56.8 kg (125 lb 3.5 oz)       Intake/Output Summary (Last 24 hours) at 08/21/16 1027 Last data filed at 08/21/16 1517  Gross per 24 hour  Intake           648.33 ml  Output              200 ml  Net           448.33 ml   Filed Weights   08/21/16 0313  Weight: 56.8 kg (125 lb 3.5 oz)    Physical Exam    GEN: Frail, elderly woman. Well nourished, well developed, in no acute distress.  Neck: Supple, no JVD, carotid bruits, or masses. Cardiac: Irreg-Irreg with controlled rate. no murmurs, rubs, or gallops. No clubbing, cyanosis, edema.  Radials/DP/PT 2+ and equal bilaterally.  Respiratory:  Respirations regular and unlabored, clear to auscultation bilaterally. GI: Soft, nontender, nondistended, BS + x 4. Skin: warm and dry, no rash. Psych: AAOx3.  Normal affect.  Labs    CBC  Recent Labs  08/20/16 1237 08/21/16 0247  WBC 11.7* 10.6*  HGB 16.4* 13.5  HCT 49.9* 41.8  MCV 94.5 93.9  PLT 285 616   Basic Metabolic Panel  Recent Labs  08/20/16 1237 08/20/16 1414 08/21/16 0247  NA 139  --  141  K 4.0  --  3.6  CL 103  --  108  CO2 29  --  26  GLUCOSE 129*  --  108*  BUN 12  --  10  CREATININE 0.65  --  0.64  CALCIUM 9.4  --  8.4*  MG  --  2.0  --   PHOS  --  2.8  --    Liver Function Tests No results for input(s): AST, ALT, ALKPHOS, BILITOT, PROT, ALBUMIN in the last 72 hours. No results for input(s): LIPASE, AMYLASE in the last 72 hours. Cardiac Enzymes  Recent Labs  08/20/16 1659  TROPONINI 0.12*   BNP Invalid input(s): POCBNP D-Dimer No results for input(s): DDIMER in the last 72 hours. Hemoglobin A1C No results for input(s): HGBA1C in the last 72 hours. Fasting Lipid Panel No results for input(s): CHOL, HDL, LDLCALC, TRIG, CHOLHDL, LDLDIRECT in the last 72 hours. Thyroid Function Tests  Recent Labs  08/20/16 1414  TSH 1.970    Telemetry    Currently Afib in 80-90s (but has been as high as 140s) - Personally Reviewed  ECG    Not done today - Personally Reviewed  Radiology    Dg Chest 2 View  Result Date: 08/20/2016 CLINICAL DATA:  Shortness of breath,  weakness and dizziness since yesterday. EXAM: CHEST  2 VIEW COMPARISON:  PA and lateral chest 03/28/2007. FINDINGS: The patient has a new mass in the right upper lobe measuring 3.2 cm transverse by 4.2 cm craniocaudal by 4.4 cm AP. The lungs appear emphysematous. Heart size is upper normal. Aorta atherosclerosis is noted. 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 Emphysema. Electronically Signed   By: Inge Rise M.D.   On: 08/20/2016 13:14   Ct Angio Chest Pe W And/or Wo Contrast  Result Date: 08/20/2016 CLINICAL DATA:  Shortness of breath.  Rule out pulmonary embolism. EXAM: CT ANGIOGRAPHY CHEST WITH CONTRAST TECHNIQUE: Multidetector CT imaging of the chest was performed using the standard protocol during bolus administration of intravenous contrast. Multiplanar CT image reconstructions and MIPs were obtained to evaluate the vascular anatomy. CONTRAST:  100 cc of Isovue 370 COMPARISON:  Chest radiograph of earlier today.  No prior CT. FINDINGS: Cardiovascular: The quality of this exam for evaluation of pulmonary embolism is good. No evidence of pulmonary embolism. Mild cardiomegaly, with LAD coronary artery atherosclerosis. Advanced aortic and branch vessel atherosclerosis. Tortuous thoracic aorta. Mediastinum/Nodes: No mediastinal or hilar adenopathy. Lungs/Pleura: No pleural fluid. Lower lobe predominant bronchial wall thickening. Mild centrilobular emphysema. Right upper lobe lung mass, 3.4 x 4.0 cm on image 24/series 407. 3.9 cm craniocaudal. 4 mm right lower lobe pulmonary nodule on image 92/series 407. Spiculated left upper lobe density measures 1.2 x 0.8 cm on image 23/series 4 and 7. Vague 5 mm left lower lobe pulmonary nodule on image 63/series 47. Upper Abdomen: Old granulomatous disease in the liver and spleen. Normal imaged portions of the stomach, pancreas, gallbladder, adrenal glands, kidneys. Abdominal aortic atherosclerosis. Musculoskeletal: Left rotator cuff  repair. Review of the MIP images confirms the above findings. IMPRESSION: 1.  No evidence of pulmonary embolism. 2. Right upper lobe lung mass, consistent with primary bronchogenic carcinoma. No thoracic adenopathy. 3. Irregular left upper lobe opacity could represent a synchronous primary bronchogenic carcinoma. A focus of infection is felt less likely. 4. Scattered nonspecific smaller pulmonary nodules. 5.  Coronary artery atherosclerosis. Aortic atherosclerosis. Electronically Signed   By: Abigail Miyamoto M.D.   On: 08/20/2016 16:18    Cardiac Studies   N/a - recommend Echo tomorrow if HR stable.   Assessment & Plan  Principal Problem:   DOE (dyspnea on exertion) - breathing improved; underlying COPD with probably URI. -- Is suspect that Afib RVR is playing a role, but likely triggered by URI.  Active Problems:   PAF (paroxysmal atrial fibrillation) ([place]) now with Atrial fibrillation with RVR (HCC)  HR this AM looks better upon my review - but still had HRs in 140s up til ~10 AM.  --> will change Dilt to 60 mg q 8 hr (increases overall dose to 180 mg) - may need to go up to  240 mg for d/c -- need to evaluate HR response to walking.  She had not been anticoagulated b/c no recurrence. CHADSVASC score is 4: Will need to discuss potential full anticoagulation, but need to ensure no invasive procedures planned to evaluate lung mass --> would probably consider Eliquis '5mg'$  BID) -> at a minimum, this would allow for consideration of DCCV either TEE/DCCV as inpatient (if not improving) or without TEE in ~4 weeks as OP.  Can discuss long term AC once we are out of this current situation. -- she has not wanted AC in the past.    Essential hypertension - not really of concern.  BP normalized, should tolerate Diltiazem.    Elevated troponin - demand related with Afib RVR (would not work up for ischemia); consider checking echo once HR has stabilized.    Allergic rhinitis due to pollen   Lung  mass   Signed, Glenetta Hew, MD  08/21/2016, 10:27 AM

## 2016-08-21 NOTE — Consult Note (Signed)
PULMONARY / CRITICAL CARE MEDICINE   Name: Jodi Burns MRN: [id number] DOB: [birth year]    ADMISSION DATE:  08/20/2016 CONSULTATION DATE:  08/21/16  REFERRING MD:  [facility]  CHIEF COMPLAINT:  Lung mass  HISTORY OF PRESENT ILLNESS:   36 yowf quit smoking 17 y PTA with confirmed GOLD II copd 2012 prev followed by Dr Annamaria Boots with sev inhalers she doesn't use admitted 12/24 with gen weakness with acute  sob x 48 h and found to be in [facility] with incidental RUL mass on cxr so PCCM service consulted for w/u. She has URI 3 weeks PTA but feels she is over that now s change in COPD Rx (symb/saba)   She denies chronically being limited by sob and has min white mucus production s cp /wt loss or h/o hemoptysis  No obvious day to day or daytime variability or assoc   mucus plugs or hemoptysis or cp or chest tightness, subjective wheeze or overt sinus or hb symptoms. No unusual exp hx or h/o childhood pna/ asthma or knowledge of premature birth.  Sleeping ok without nocturnal  or early am exacerbation  of respiratory  c/o's or need for noct saba. Also denies any obvious fluctuation of symptoms with weather or environmental changes or other aggravating or alleviating factors except as outlined above   Current Medications, Allergies, Complete Past Medical History, Past Surgical History, Family History, and Social History were reviewed in [employer] record.  ROS  The following are not active complaints unless bolded sore throat, dysphagia, dental problems, itching, sneezing,  nasal congestion or excess/ purulent secretions, ear ache,   fever, chills, sweats, unintended wt loss, classically pleuritic or exertional cp,  orthopnea pnd or leg swelling, presyncope, palpitations, abdominal pain, anorexia, nausea, vomiting, diarrhea  or change in bowel or bladder habits, change in stools or urine, dysuria,hematuria,  rash, arthralgias, visual complaints, headache, numbness, weakness or ataxia  or problems with walking or coordination,  change in mood/affect or memory.           PAST MEDICAL HISTORY :  She  has a past medical history of Allergic rhinitis; Atrial fibrillation ([place]); Dyspnea on exertion; History of histoplasmosis; Hyperlipidemia; Osteopenia; Other and unspecified hyperlipidemia; Other retinal disorders(362.89); Personal history of other diseases of circulatory system; Personal history of venous thrombosis and embolism (4/94); Reactive airway disease; Subjective visual disturbance, unspecified; Unspecified asthma(493.90); and Vitamin D deficiency.  PAST SURGICAL HISTORY: She  has a past surgical history that includes Breast biopsy; Appendectomy; Total abdominal hysterectomy; Breast biopsy; Rotator cuff repair (7/04, 2003); Carpal tunnel release (2006); Perineoplasty; transthoracic echocardiogram (February 2011); Cardiovascular stress test (August 2013); and Pulmonary Function Test (August 2013).  Allergies  Allergen Reactions  . Cefaclor     REACTION: gi upset  . Codeine Nausea And Vomiting    No current facility-administered medications on file prior to encounter.    Current Outpatient Prescriptions on File Prior to Encounter  Medication Sig  . acetaminophen (TYLENOL) 325 MG tablet Take 650 mg by mouth every 6 (six) hours as needed for mild pain.   Marland Kitchen albuterol (PROAIR HFA) 108 (90 BASE) MCG/ACT inhaler Inhale 2 puffs into the lungs every 6 (six) hours as needed for wheezing or shortness of breath.  . budesonide-formoterol (SYMBICORT) 160-4.5 MCG/ACT inhaler Inhale 2 puffs into the lungs 2 (two) times daily. Rinse mouth  . Cholecalciferol (VITAMIN D3) 5000 units TABS Take 2 tablets by mouth daily.  . Coenzyme Q10 (COQ10) 200 MG CAPS Take 1 capsule  by mouth once.   . cyanocobalamin 1000 MCG tablet Take 2,000 mcg by mouth daily.  . digoxin (LANOXIN) 0.125 MG tablet Take 125 mcg by mouth daily.    . dorzolamide-timolol (COSOPT) 22.3-6.8 MG/ML ophthalmic solution  Place 1 drop into both eyes 2 (two) times daily.    Marland Kitchen ipratropium (ATROVENT) 0.03 % nasal spray 1-2 puffs each nostril every 8 hours if needed  . loratadine (CLARITIN) 10 MG tablet Take 10 mg by mouth daily as needed for allergies.   . Multiple Vitamins-Minerals (PRESERVISION/LUTEIN) CAPS Take 1 capsule by mouth 2 (two) times daily.      FAMILY HISTORY:  Her indicated that her mother is deceased. She indicated that her father is deceased. She indicated that her maternal grandmother is deceased. She indicated that her maternal grandfather is deceased. She indicated that her paternal grandmother is deceased. She indicated that her paternal grandfather is deceased.    SOCIAL HISTORY: She  reports that she has quit smoking. She has never used smokeless tobacco. She reports that she drinks alcohol. She reports that she does not use drugs.     SUBJECTIVE:  Nad on RA  VITAL SIGNS: BP (!) 130/103   Pulse 93   Temp 97.4 F (36.3 C) (Oral)   Resp 17   Wt 125 lb 3.5 oz (56.8 kg)   LMP 08/29/1967   SpO2 99%   BMI 22.90 kg/m   HEMODYNAMICS:    VENTILATOR SETTINGS:    INTAKE / OUTPUT: I/O last 3 completed shifts: In: 478.3 [I.V.:478.3] Out: -   PHYSICAL EXAMINATION: General:  Alert pleasant wf nad Neuro:  Nl sensorium/ no motor deficits HEENT:  Oropharynx clear Cardiovascular:  IRIR apical pulse about 90 Lungs:  slt distant bs/ no wheeze  Abdomen:  Soft/ benign, nl excursion Musculoskeletal:  No gross changes Skin:  nl  LABS:  BMET  Recent Labs Lab 08/20/16 1237 08/21/16 0247  NA 139 141  K 4.0 3.6  CL 103 108  CO2 29 26  BUN 12 10  CREATININE 0.65 0.64  GLUCOSE 129* 108*    Electrolytes  Recent Labs Lab 08/20/16 1237 08/20/16 1414 08/21/16 0247  CALCIUM 9.4  --  8.4*  MG  --  2.0  --   PHOS  --  2.8  --     CBC  Recent Labs Lab 08/20/16 1237 08/21/16 0247  WBC 11.7* 10.6*  HGB 16.4* 13.5  HCT 49.9* 41.8  PLT 285 258    Coag's No results  for input(s): APTT, INR in the last 168 hours.  Sepsis Markers No results for input(s): LATICACIDVEN, PROCALCITON, O2SATVEN in the last 168 hours.  ABG No results for input(s): PHART, PCO2ART, PO2ART in the last 168 hours.  Liver Enzymes No results for input(s): AST, ALT, ALKPHOS, BILITOT, ALBUMIN in the last 168 hours.  Cardiac Enzymes  Recent Labs Lab 08/20/16 1659  TROPONINI 0.12*    Glucose No results for input(s): GLUCAP in the last 168 hours.  Imaging Dg Chest 2 View  Result Date: 08/20/2016 CLINICAL DATA:  Shortness of breath, weakness and dizziness since yesterday. EXAM: CHEST  2 VIEW COMPARISON:  PA and lateral chest 03/28/2007. FINDINGS: The patient has a new mass in the right upper lobe measuring 3.2 cm transverse by 4.2 cm craniocaudal by 4.4 cm AP. The lungs appear emphysematous. Heart size is upper normal. Aorta atherosclerosis is noted. 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 Emphysema. Electronically Signed   By: Inge Rise M.D.  On: 08/20/2016 13:14   Ct Angio Chest Pe W And/or Wo Contrast  Result Date: 08/20/2016 CLINICAL DATA:  Shortness of breath.  Rule out pulmonary embolism. EXAM: CT ANGIOGRAPHY CHEST WITH CONTRAST TECHNIQUE: Multidetector CT imaging of the chest was performed using the standard protocol during bolus administration of intravenous contrast. Multiplanar CT image reconstructions and MIPs were obtained to evaluate the vascular anatomy. CONTRAST:  100 cc of Isovue 370 COMPARISON:  Chest radiograph of earlier today.  No prior CT. FINDINGS: Cardiovascular: The quality of this exam for evaluation of pulmonary embolism is good. No evidence of pulmonary embolism. Mild cardiomegaly, with LAD coronary artery atherosclerosis. Advanced aortic and branch vessel atherosclerosis. Tortuous thoracic aorta. Mediastinum/Nodes: No mediastinal or hilar adenopathy. Lungs/Pleura: No pleural fluid. Lower lobe predominant bronchial wall  thickening. Mild centrilobular emphysema. Right upper lobe lung mass, 3.4 x 4.0 cm on image 24/series 407. 3.9 cm craniocaudal. 4 mm right lower lobe pulmonary nodule on image 92/series 407. Spiculated left upper lobe density measures 1.2 x 0.8 cm on image 23/series 4 and 7. Vague 5 mm left lower lobe pulmonary nodule on image 63/series 47. Upper Abdomen: Old granulomatous disease in the liver and spleen. Normal imaged portions of the stomach, pancreas, gallbladder, adrenal glands, kidneys. Abdominal aortic atherosclerosis. Musculoskeletal: Left rotator cuff repair. Review of the MIP images confirms the above findings. IMPRESSION: 1.  No evidence of pulmonary embolism. 2. Right upper lobe lung mass, consistent with primary bronchogenic carcinoma. No thoracic adenopathy. 3. Irregular left upper lobe opacity could represent a synchronous primary bronchogenic carcinoma. A focus of infection is felt less likely. 4. Scattered nonspecific smaller pulmonary nodules. 5.  Coronary artery atherosclerosis. Aortic atherosclerosis. Electronically Signed   By: Abigail Miyamoto M.D.   On: 08/20/2016 16:18        ASSESSMENT / PLAN:   1) Probable bronchogenic Ca RUL/ r/o met dz vs second primary - Next step is PET/ not lung bx/ needs to be scheduled as an outpt and obviously not anticoagulated prior to any bx   Discussed in detail all the  indications, usual  risks and alternatives  relative to the benefits with patient who agrees to proceed with conservative w/u as outlined .   2) COPD GOLD II sp remote smoking cessation s/p mild exac with URI  When respiratory symptoms begin or become refractory well after a patient reports complete smoking cessation,  Especially when this wasn't the case while they were smoking, a red flag is raised based on the work of Dr Kris Mouton which states:  if you quit smoking when your best day FEV1 is still well preserved it is highly unlikely you will progress to severe disease.  That is  to say, once the smoking stops,  the symptoms should not suddenly erupt or markedly worsen.  If so, the differential diagnosis should include  obesity/deconditioning,  LPR/Reflux/Aspiration syndromes,  occult CHF(egdiastolic dysfunction from rapid afib) , or  especially side effect of medications commonly used in this population.   No change chronic rx needed     Ok to discharge and f/u with my office in 2 weeks or can see Dr Annamaria Boots whom she has previously seen   Christinia Gully, MD [facility] [place] [phone] After 5:30 PM or weekends, call [phone]

## 2016-08-21 NOTE — Progress Notes (Signed)
PROGRESS NOTE    Jodi Burns  LEX:[id number] DOB: November 28, 1925 DOA: 08/20/2016 PCP: Jerlyn Ly, MD     Brief Narrative:  Jodi Burns is a 82 y.o. female with PMHx significant for AFib and former smoker who presented with SOB. On presentation, she was in A Fib RVR. CT chest revealed new lung masses.   Assessment & Plan:   Principal Problem:   Atrial fibrillation with RVR (HCC) Active Problems:   Hyperlipidemia with target LDL less than 130   Allergic rhinitis due to pollen   Essential hypertension   Lung mass   Afib with RVR -CHASVASC = 4. Did not tolerate IV cardizem due to hypotension.  -Cardiology following -Cardizem '30mg'$  q6h, digoxin -Echo when HR better controlled -Cardiac monitor  Lung mass -CT chest revealed: right upper lobe lung mass, consistent with primary bronchogenic carcinoma as well as irregular left upper lobe opacity which could represent a synchronous primary bronchogenic carcinoma -Pulmonology consulted   Elevated troponin -Likely due to demand, trended down   COPD -Without acute exacerbation. Continue home inhaler.    DVT prophylaxis: lovenox Code Status: Full Family Communication: husband at bedside  Disposition Plan: pending further work up and improvement    Consultants:   Cardiology  Pulmonology  Procedures:   None  Antimicrobials:   None     Subjective: Patient states that she has had an URI with cough  for the past 2-3 weeks. She has also had some generalized weakness as well. She currently has no complaints of chest pain, shortness of breath, hemoptysis, nausea, vomiting, abdominal pain. She feels well overall but is concerned about her new diagnosis of lung masses.   Objective: Vitals:   08/20/16 2007 08/20/16 2322 08/21/16 0313 08/21/16 0346  BP: (!) 154/100 (!) 110/57  131/71  Pulse: (!) 111 97 (!) 108 (!) 106  Resp: (!) 27 (!) 22 (!) 24 (!) 25  Temp: 98.1 F (36.7 C) 98.4 F (36.9 C) 98 F (36.7 C)    TempSrc: Oral Oral Oral   SpO2: 99% 96% 97% 97%  Weight:   56.8 kg (125 lb 3.5 oz)     Intake/Output Summary (Last 24 hours) at 08/21/16 0751 Last data filed at 08/21/16 0600  Gross per 24 hour  Intake           478.33 ml  Output                0 ml  Net           478.33 ml   Filed Weights   08/21/16 0313  Weight: 56.8 kg (125 lb 3.5 oz)    Examination:  General exam: Appears calm and comfortable  Respiratory system: Clear to auscultation. Respiratory effort normal. Cardiovascular system: S1 & S2 heard, Irregular rhythm, rate in low 100s. No JVD, murmurs, rubs, gallops or clicks. No pedal edema. Gastrointestinal system: Abdomen is nondistended, soft and nontender. No organomegaly or masses felt. Normal bowel sounds heard. Central nervous system: Alert and oriented. No focal neurological deficits. Extremities: Symmetric 5 x 5 power. Skin: No rashes, lesions or ulcers Psychiatry: Judgement and insight appear normal. Mood & affect appropriate.   Data Reviewed: I have personally reviewed following labs and imaging studies  CBC:  Recent Labs Lab 08/20/16 1237 08/21/16 0247  WBC 11.7* 10.6*  HGB 16.4* 13.5  HCT 49.9* 41.8  MCV 94.5 93.9  PLT 285 449   Basic Metabolic Panel:  Recent Labs Lab 08/20/16 1237 08/20/16 1414 08/21/16 0247  NA 139  --  141  K 4.0  --  3.6  CL 103  --  108  CO2 29  --  26  GLUCOSE 129*  --  108*  BUN 12  --  10  CREATININE 0.65  --  0.64  CALCIUM 9.4  --  8.4*  MG  --  2.0  --   PHOS  --  2.8  --    GFR: Estimated Creatinine Clearance: 39.2 mL/min (by C-G formula based on SCr of 0.64 mg/dL). Liver Function Tests: No results for input(s): AST, ALT, ALKPHOS, BILITOT, PROT, ALBUMIN in the last 168 hours. No results for input(s): LIPASE, AMYLASE in the last 168 hours. No results for input(s): AMMONIA in the last 168 hours. Coagulation Profile: No results for input(s): INR, PROTIME in the last 168 hours. Cardiac Enzymes:  Recent  Labs Lab 08/20/16 1659  TROPONINI 0.12*   BNP (last 3 results) No results for input(s): PROBNP in the last 8760 hours. HbA1C: No results for input(s): HGBA1C in the last 72 hours. CBG: No results for input(s): GLUCAP in the last 168 hours. Lipid Profile: No results for input(s): CHOL, HDL, LDLCALC, TRIG, CHOLHDL, LDLDIRECT in the last 72 hours. Thyroid Function Tests:  Recent Labs  08/20/16 1414  TSH 1.970   Anemia Panel: No results for input(s): VITAMINB12, FOLATE, FERRITIN, TIBC, IRON, RETICCTPCT in the last 72 hours. Sepsis Labs: No results for input(s): PROCALCITON, LATICACIDVEN in the last 168 hours.  Recent Results (from the past 240 hour(s))  MRSA PCR Screening     Status: None   Collection Time: 08/20/16  9:00 PM  Result Value Ref Range Status   MRSA by PCR NEGATIVE NEGATIVE Final    Comment:        The GeneXpert MRSA Assay (FDA approved for NASAL specimens only), is one component of a comprehensive MRSA colonization surveillance program. It is not intended to diagnose MRSA infection nor to guide or monitor treatment for MRSA infections.        Radiology Studies: Dg Chest 2 View  Result Date: 08/20/2016 CLINICAL DATA:  Shortness of breath, weakness and dizziness since yesterday. EXAM: CHEST  2 VIEW COMPARISON:  PA and lateral chest 03/28/2007. FINDINGS: The patient has a new mass in the right upper lobe measuring 3.2 cm transverse by 4.2 cm craniocaudal by 4.4 cm AP. The lungs appear emphysematous. Heart size is upper normal. Aorta atherosclerosis is noted. 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 Emphysema. Electronically Signed   By: Inge Rise M.D.   On: 08/20/2016 13:14   Ct Angio Chest Pe W And/or Wo Contrast  Result Date: 08/20/2016 CLINICAL DATA:  Shortness of breath.  Rule out pulmonary embolism. EXAM: CT ANGIOGRAPHY CHEST WITH CONTRAST TECHNIQUE: Multidetector CT imaging of the chest was performed using the standard  protocol during bolus administration of intravenous contrast. Multiplanar CT image reconstructions and MIPs were obtained to evaluate the vascular anatomy. CONTRAST:  100 cc of Isovue 370 COMPARISON:  Chest radiograph of earlier today.  No prior CT. FINDINGS: Cardiovascular: The quality of this exam for evaluation of pulmonary embolism is good. No evidence of pulmonary embolism. Mild cardiomegaly, with LAD coronary artery atherosclerosis. Advanced aortic and branch vessel atherosclerosis. Tortuous thoracic aorta. Mediastinum/Nodes: No mediastinal or hilar adenopathy. Lungs/Pleura: No pleural fluid. Lower lobe predominant bronchial wall thickening. Mild centrilobular emphysema. Right upper lobe lung mass, 3.4 x 4.0 cm on image 24/series 407. 3.9 cm craniocaudal. 4 mm right lower lobe pulmonary  nodule on image 92/series 407. Spiculated left upper lobe density measures 1.2 x 0.8 cm on image 23/series 4 and 7. Vague 5 mm left lower lobe pulmonary nodule on image 63/series 47. Upper Abdomen: Old granulomatous disease in the liver and spleen. Normal imaged portions of the stomach, pancreas, gallbladder, adrenal glands, kidneys. Abdominal aortic atherosclerosis. Musculoskeletal: Left rotator cuff repair. Review of the MIP images confirms the above findings. IMPRESSION: 1.  No evidence of pulmonary embolism. 2. Right upper lobe lung mass, consistent with primary bronchogenic carcinoma. No thoracic adenopathy. 3. Irregular left upper lobe opacity could represent a synchronous primary bronchogenic carcinoma. A focus of infection is felt less likely. 4. Scattered nonspecific smaller pulmonary nodules. 5.  Coronary artery atherosclerosis. Aortic atherosclerosis. Electronically Signed   By: Abigail Miyamoto M.D.   On: 08/20/2016 16:18      Scheduled Meds: . digoxin  125 mcg Oral Daily  . diltiazem  30 mg Oral Q6H  . dorzolamide  1 drop Both Eyes BID  . enoxaparin (LOVENOX) injection  40 mg Subcutaneous Q24H  .  mometasone-formoterol  2 puff Inhalation BID  . sodium chloride flush  3 mL Intravenous Q12H  . timolol  1 drop Both Eyes BID   Continuous Infusions: . sodium chloride 50 mL/hr at 08/20/16 2026     LOS: 1 day    Time spent: 40 minutes   Dessa Phi, DO [facility] [email or web address] Password [facility] 08/21/2016, 7:51 AM

## 2016-08-22 LAB — CBC
HCT: 41.2 % (ref 36.0–46.0)
Hemoglobin: 13.1 g/dL (ref 12.0–15.0)
MCH: 30.2 pg (ref 26.0–34.0)
MCHC: 31.8 g/dL (ref 30.0–36.0)
MCV: 94.9 fL (ref 78.0–100.0)
PLATELETS: 236 10*3/uL (ref 150–400)
RBC: 4.34 MIL/uL (ref 3.87–5.11)
RDW: 14.2 % (ref 11.5–15.5)
WBC: 9.3 10*3/uL (ref 4.0–10.5)

## 2016-08-22 MED ORDER — DILTIAZEM HCL ER COATED BEADS 180 MG PO CP24
180.0000 mg | ORAL_CAPSULE | Freq: Every day | ORAL | Status: AC
  Administered 2016-08-22 – 2016-08-31 (×10): 180 mg via ORAL
  Filled 2016-08-22 (×10): qty 1

## 2016-08-22 NOTE — Progress Notes (Addendum)
PROGRESS NOTE    Jodi Burns  RAX:[id number] DOB: 08/23/1926 DOA: 08/20/2016 PCP: Jerlyn Ly, MD     Brief Narrative:  Jodi Burns is a 82 y.o. female with PMHx significant for AFib and former smoker who presented with SOB. She reports URI about 3 weeks ago and gradually weak over the past couple of days. On presentation, she was in A Fib RVR. CT chest revealed new lung masses. Cardiology and pulmonology have been consulted.     Assessment & Plan:   Principal Problem:   DOE (dyspnea on exertion) Active Problems:   Allergic rhinitis due to pollen   PAF (paroxysmal atrial fibrillation) (HCC)   Essential hypertension   Atrial fibrillation with RVR (HCC)   Lung mass   Elevated troponin   COPD GOLD II   Afib with RVR, rate improved now  -CHASVASC = 4. Did not tolerate IV cardizem due to hypotension.  -Cardiology following -On cardizem PO and digoxin. Eliquis started this admission.  -Echo pending -Tele, can transfer out of stepdown today   Lung mass -CT chest revealed: right upper lobe lung mass, consistent with primary bronchogenic carcinoma as well as irregular left upper lobe opacity which could represent a synchronous primary bronchogenic carcinoma -Pulmonology consulted. Will need PET as outpatient and follow up with pulm in 2 weeks   Elevated troponin -Likely due to demand, trended down   COPD -Without acute exacerbation. Continue home inhaler.    DVT prophylaxis: eliquis  Code Status: Full Family Communication: husband at bedside  Disposition Plan: hopefully discharge to home 12/27 if rate remains well controlled with ambulation   Consultants:   Cardiology  Pulmonology  Procedures:   None  Antimicrobials:   None     Subjective: Doing well this morning. No complaints today, denies any Cp, SOB, N/V. Tolerating meals.   Objective: Vitals:   08/22/16 0300 08/22/16 0400 08/22/16 0402 08/22/16 0843  BP:   (!) 128/59 (!) 137/46    Pulse:  70 (!) 44 89  Resp:  '19 19 20  '$ Temp: 98.4 F (36.9 C)   97.6 F (36.4 C)  TempSrc: Oral   Oral  SpO2:  96% 99% 98%  Weight: 56.6 kg (124 lb 12.5 oz)       Intake/Output Summary (Last 24 hours) at 08/22/16 0902 Last data filed at 08/22/16 0400  Gross per 24 hour  Intake              460 ml  Output             1075 ml  Net             -615 ml   Filed Weights   08/21/16 0313 08/22/16 0300  Weight: 56.8 kg (125 lb 3.5 oz) 56.6 kg (124 lb 12.5 oz)    Examination:  General exam: Appears calm and comfortable  Respiratory system: Clear to auscultation. Respiratory effort normal. Cardiovascular system: S1 & S2 heard, Irregular rhythm, rate in 90s. No JVD, murmurs, rubs, gallops or clicks. No pedal edema. Gastrointestinal system: Abdomen is nondistended, soft and nontender. No organomegaly or masses felt. Normal bowel sounds heard. Central nervous system: Alert and oriented. No focal neurological deficits. Extremities: Symmetric 5 x 5 power. Skin: No rashes, lesions or ulcers Psychiatry: Judgement and insight appear normal. Mood & affect appropriate.   Data Reviewed: I have personally reviewed following labs and imaging studies  CBC:  Recent Labs Lab 08/20/16 1237 08/21/16 0247 08/22/16 0455  WBC 11.7* 10.6*  9.3  HGB 16.4* 13.5 13.1  HCT 49.9* 41.8 41.2  MCV 94.5 93.9 94.9  PLT 285 258 283   Basic Metabolic Panel:  Recent Labs Lab 08/20/16 1237 08/20/16 1414 08/21/16 0247  NA 139  --  141  K 4.0  --  3.6  CL 103  --  108  CO2 29  --  26  GLUCOSE 129*  --  108*  BUN 12  --  10  CREATININE 0.65  --  0.64  CALCIUM 9.4  --  8.4*  MG  --  2.0  --   PHOS  --  2.8  --    GFR: Estimated Creatinine Clearance: 39.2 mL/min (by C-G formula based on SCr of 0.64 mg/dL). Liver Function Tests: No results for input(s): AST, ALT, ALKPHOS, BILITOT, PROT, ALBUMIN in the last 168 hours. No results for input(s): LIPASE, AMYLASE in the last 168 hours. No results for  input(s): AMMONIA in the last 168 hours. Coagulation Profile: No results for input(s): INR, PROTIME in the last 168 hours. Cardiac Enzymes:  Recent Labs Lab 08/20/16 1659  TROPONINI 0.12*   BNP (last 3 results) No results for input(s): PROBNP in the last 8760 hours. HbA1C: No results for input(s): HGBA1C in the last 72 hours. CBG: No results for input(s): GLUCAP in the last 168 hours. Lipid Profile: No results for input(s): CHOL, HDL, LDLCALC, TRIG, CHOLHDL, LDLDIRECT in the last 72 hours. Thyroid Function Tests:  Recent Labs  08/20/16 1414  TSH 1.970   Anemia Panel: No results for input(s): VITAMINB12, FOLATE, FERRITIN, TIBC, IRON, RETICCTPCT in the last 72 hours. Sepsis Labs: No results for input(s): PROCALCITON, LATICACIDVEN in the last 168 hours.  Recent Results (from the past 240 hour(s))  MRSA PCR Screening     Status: None   Collection Time: 08/20/16  9:00 PM  Result Value Ref Range Status   MRSA by PCR NEGATIVE NEGATIVE Final    Comment:        The GeneXpert MRSA Assay (FDA approved for NASAL specimens only), is one component of a comprehensive MRSA colonization surveillance program. It is not intended to diagnose MRSA infection nor to guide or monitor treatment for MRSA infections.        Radiology Studies: Dg Chest 2 View  Result Date: 08/20/2016 CLINICAL DATA:  Shortness of breath, weakness and dizziness since yesterday. EXAM: CHEST  2 VIEW COMPARISON:  PA and lateral chest 03/28/2007. FINDINGS: The patient has a new mass in the right upper lobe measuring 3.2 cm transverse by 4.2 cm craniocaudal by 4.4 cm AP. The lungs appear emphysematous. Heart size is upper normal. Aorta atherosclerosis is noted. 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 Emphysema. Electronically Signed   By: Inge Rise M.D.   On: 08/20/2016 13:14   Ct Angio Chest Pe W And/or Wo Contrast  Result Date: 08/20/2016 CLINICAL DATA:  Shortness of breath.   Rule out pulmonary embolism. EXAM: CT ANGIOGRAPHY CHEST WITH CONTRAST TECHNIQUE: Multidetector CT imaging of the chest was performed using the standard protocol during bolus administration of intravenous contrast. Multiplanar CT image reconstructions and MIPs were obtained to evaluate the vascular anatomy. CONTRAST:  100 cc of Isovue 370 COMPARISON:  Chest radiograph of earlier today.  No prior CT. FINDINGS: Cardiovascular: The quality of this exam for evaluation of pulmonary embolism is good. No evidence of pulmonary embolism. Mild cardiomegaly, with LAD coronary artery atherosclerosis. Advanced aortic and branch vessel atherosclerosis. Tortuous thoracic aorta. Mediastinum/Nodes: No mediastinal or  hilar adenopathy. Lungs/Pleura: No pleural fluid. Lower lobe predominant bronchial wall thickening. Mild centrilobular emphysema. Right upper lobe lung mass, 3.4 x 4.0 cm on image 24/series 407. 3.9 cm craniocaudal. 4 mm right lower lobe pulmonary nodule on image 92/series 407. Spiculated left upper lobe density measures 1.2 x 0.8 cm on image 23/series 4 and 7. Vague 5 mm left lower lobe pulmonary nodule on image 63/series 47. Upper Abdomen: Old granulomatous disease in the liver and spleen. Normal imaged portions of the stomach, pancreas, gallbladder, adrenal glands, kidneys. Abdominal aortic atherosclerosis. Musculoskeletal: Left rotator cuff repair. Review of the MIP images confirms the above findings. IMPRESSION: 1.  No evidence of pulmonary embolism. 2. Right upper lobe lung mass, consistent with primary bronchogenic carcinoma. No thoracic adenopathy. 3. Irregular left upper lobe opacity could represent a synchronous primary bronchogenic carcinoma. A focus of infection is felt less likely. 4. Scattered nonspecific smaller pulmonary nodules. 5.  Coronary artery atherosclerosis. Aortic atherosclerosis. Electronically Signed   By: Abigail Miyamoto M.D.   On: 08/20/2016 16:18      Scheduled Meds: . apixaban  2.5 mg  Oral BID  . digoxin  125 mcg Oral Daily  . diltiazem  180 mg Oral Daily  . dorzolamide  1 drop Both Eyes BID  . mometasone-formoterol  2 puff Inhalation BID  . sodium chloride flush  3 mL Intravenous Q12H  . timolol  1 drop Both Eyes BID   Continuous Infusions:    LOS: 2 days    Time spent: 30 minutes   Dessa Phi, DO [facility] [email or web address] Password [email or web address] 08/22/2016, 9:02 AM

## 2016-08-22 NOTE — Progress Notes (Signed)
Per insurance check for Eliquis S/W [name] @ [facility] # [id number]   ELIQUIS 2.5 MG BID 30 / 60 TAB   COVER- YES  CO-PAY- $ 35.00  TIER- 3 DRUG  PRIOR APPROVAL- YES # [phone]  PHARMACY : [facility], [facility] , [facility] AND [facility]

## 2016-08-22 NOTE — Progress Notes (Addendum)
Patient Name: Jodi Burns Date of Encounter: 08/22/2016  Primary Cardiologist: Dr Bergan [facility] Problem List     Principal Problem:   DOE (dyspnea on exertion) Active Problems:   Allergic rhinitis due to pollen   PAF (paroxysmal atrial fibrillation) (HCC)   Essential hypertension   Atrial fibrillation with RVR (HCC)   Lung mass   Elevated troponin   COPD GOLD II     Subjective   No chest pain or dyspnea  Inpatient Medications    Scheduled Meds: . apixaban  2.5 mg Oral BID  . digoxin  125 mcg Oral Daily  . diltiazem  60 mg Oral Q8H  . dorzolamide  1 drop Both Eyes BID  . mometasone-formoterol  2 puff Inhalation BID  . sodium chloride flush  3 mL Intravenous Q12H  . timolol  1 drop Both Eyes BID   Continuous Infusions:  PRN Meds: acetaminophen **OR** acetaminophen, hydrALAZINE, ipratropium   Vital Signs    Vitals:   08/21/16 2300 08/22/16 0300 08/22/16 0400 08/22/16 0402  BP: (!) 108/51   (!) 128/59  Pulse: 81  70 (!) 44  Resp: '20  19 19  '$ Temp: 97.8 F (36.6 C) 98.4 F (36.9 C)    TempSrc: Oral Oral    SpO2: 95%  96% 99%  Weight:  124 lb 12.5 oz (56.6 kg)      Intake/Output Summary (Last 24 hours) at 08/22/16 0733 Last data filed at 08/22/16 0400  Gross per 24 hour  Intake              510 ml  Output             1075 ml  Net             -565 ml   Filed Weights   08/21/16 0313 08/22/16 0300  Weight: 125 lb 3.5 oz (56.8 kg) 124 lb 12.5 oz (56.6 kg)    Physical Exam    GEN: Well nourished, frail, in no acute distress.  HEENT: Grossly normal.  Neck: Supple Cardiac: irregular Respiratory:  CTA GI: Soft, nontender, nondistended. MS: no deformity or atrophy. Skin: warm and dry, no rash. Neuro:  Strength and sensation are intact. Psych: AAOx3.  Normal affect.  Labs    CBC  Recent Labs  08/21/16 0247 08/22/16 0455  WBC 10.6* 9.3  HGB 13.5 13.1  HCT 41.8 41.2  MCV 93.9 94.9  PLT 258 542   Basic Metabolic Panel  Recent  Labs  08/20/16 1237 08/20/16 1414 08/21/16 0247  NA 139  --  141  K 4.0  --  3.6  CL 103  --  108  CO2 29  --  26  GLUCOSE 129*  --  108*  BUN 12  --  10  CREATININE 0.65  --  0.64  CALCIUM 9.4  --  8.4*  MG  --  2.0  --   PHOS  --  2.8  --    Cardiac Enzymes  Recent Labs  08/20/16 1659  TROPONINI 0.12*   Thyroid Function Tests  Recent Labs  08/20/16 1414  TSH 1.970    Telemetry    Atrial fibrillation; rate controlled - Personally Reviewed    Radiology    Dg Chest 2 View  Result Date: 08/20/2016 CLINICAL DATA:  Shortness of breath, weakness and dizziness since yesterday. EXAM: CHEST  2 VIEW COMPARISON:  PA and lateral chest 03/28/2007. FINDINGS: The patient has a new mass in the right upper  lobe measuring 3.2 cm transverse by 4.2 cm craniocaudal by 4.4 cm AP. The lungs appear emphysematous. Heart size is upper normal. Aorta atherosclerosis is noted. 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 Emphysema. Electronically Signed   By: Inge Rise M.D.   On: 08/20/2016 13:14   Ct Angio Chest Pe W And/or Wo Contrast  Result Date: 08/20/2016 CLINICAL DATA:  Shortness of breath.  Rule out pulmonary embolism. EXAM: CT ANGIOGRAPHY CHEST WITH CONTRAST TECHNIQUE: Multidetector CT imaging of the chest was performed using the standard protocol during bolus administration of intravenous contrast. Multiplanar CT image reconstructions and MIPs were obtained to evaluate the vascular anatomy. CONTRAST:  100 cc of Isovue 370 COMPARISON:  Chest radiograph of earlier today.  No prior CT. FINDINGS: Cardiovascular: The quality of this exam for evaluation of pulmonary embolism is good. No evidence of pulmonary embolism. Mild cardiomegaly, with LAD coronary artery atherosclerosis. Advanced aortic and branch vessel atherosclerosis. Tortuous thoracic aorta. Mediastinum/Nodes: No mediastinal or hilar adenopathy. Lungs/Pleura: No pleural fluid. Lower lobe predominant  bronchial wall thickening. Mild centrilobular emphysema. Right upper lobe lung mass, 3.4 x 4.0 cm on image 24/series 407. 3.9 cm craniocaudal. 4 mm right lower lobe pulmonary nodule on image 92/series 407. Spiculated left upper lobe density measures 1.2 x 0.8 cm on image 23/series 4 and 7. Vague 5 mm left lower lobe pulmonary nodule on image 63/series 47. Upper Abdomen: Old granulomatous disease in the liver and spleen. Normal imaged portions of the stomach, pancreas, gallbladder, adrenal glands, kidneys. Abdominal aortic atherosclerosis. Musculoskeletal: Left rotator cuff repair. Review of the MIP images confirms the above findings. IMPRESSION: 1.  No evidence of pulmonary embolism. 2. Right upper lobe lung mass, consistent with primary bronchogenic carcinoma. No thoracic adenopathy. 3. Irregular left upper lobe opacity could represent a synchronous primary bronchogenic carcinoma. A focus of infection is felt less likely. 4. Scattered nonspecific smaller pulmonary nodules. 5.  Coronary artery atherosclerosis. Aortic atherosclerosis. Electronically Signed   By: Abigail Miyamoto M.D.   On: 08/20/2016 16:18     Patient Profile     82 year old female admitted with atrial fibrillation and also found to have lung mass.  Assessment & Plan    1 persistent atrial fibrillation-patient remains in atrial fibrillation but her rate is much better. Change Cardizem to 180 mg daily. Continue digoxin. Continue apixaban. Await results of echocardiogram. TSH normal. If her rate is controlled and she is asymptomatic I would favor rate control and anticoagulation for now as she will require further workup of lung mass including possible biopsy which will require temporary discontinuation of anticoagulation. If we cannot control her rate or she is symptomatic we would need to consider proceeding with TEE guided cardioversion. CHADSvasc 4.Patient can be transferred to telemetry from a cardiology standpoint. Possible discharge  tomorrow if stable.   2 lung mass-further workup per pulmonary.  3 hypertension-blood pressure controlled. Continue present medications.   Signed, Kirk Ruths, MD  08/22/2016, 7:33 AM

## 2016-08-23 ENCOUNTER — Inpatient Hospital Stay (HOSPITAL_COMMUNITY): Payer: Medicare Other

## 2016-08-23 DIAGNOSIS — I4891 Unspecified atrial fibrillation: Secondary | ICD-10-CM

## 2016-08-23 LAB — ECHOCARDIOGRAM COMPLETE
CHL CUP TV REG PEAK VELOCITY: 244 cm/s
FS: 33 % (ref 28–44)
Height: 62 in
IV/PV OW: 0.86
LA diam end sys: 33 mm
LA diam index: 2.08 cm/m2
LASIZE: 33 mm
LAVOL: 35 mL
LAVOLA4C: 33 mL
LAVOLIN: 22 mL/m2
LDCA: 2.54 cm2
LV PW d: 11.8 mm — AB (ref 0.6–1.1)
LVOTD: 18 mm
TRMAXVEL: 244 cm/s
WEIGHTICAEL: 2014.12 [oz_av]

## 2016-08-23 LAB — MAGNESIUM: MAGNESIUM: 1.9 mg/dL (ref 1.7–2.4)

## 2016-08-23 NOTE — Progress Notes (Signed)
Patient Name: Jodi Burns Date of Encounter: 08/23/2016  Primary Cardiologist: Dr [facility] Problem List     Principal Problem:   DOE (dyspnea on exertion) Active Problems:   Allergic rhinitis due to pollen   PAF (paroxysmal atrial fibrillation) (HCC)   Essential hypertension   Atrial fibrillation with RVR (HCC)   Lung mass   Elevated troponin   COPD GOLD II     Subjective   No chest pain or dyspnea; weak  Inpatient Medications    Scheduled Meds: . apixaban  2.5 mg Oral BID  . digoxin  125 mcg Oral Daily  . diltiazem  180 mg Oral Daily  . dorzolamide  1 drop Both Eyes BID  . mometasone-formoterol  2 puff Inhalation BID  . sodium chloride flush  3 mL Intravenous Q12H  . timolol  1 drop Both Eyes BID   Continuous Infusions:  PRN Meds: acetaminophen **OR** acetaminophen, ipratropium   Vital Signs    Vitals:   08/22/16 2034 08/23/16 0435 08/23/16 0753 08/23/16 0820  BP: (!) 142/50 (!) 145/58  140/77  Pulse: (!) 45 74    Resp: 18 20    Temp: 98.1 F (36.7 C) 98.4 F (36.9 C)    TempSrc: Oral Oral    SpO2: 97% 97% 96%   Weight:  125 lb 14.1 oz (57.1 kg)    Height:        Intake/Output Summary (Last 24 hours) at 08/23/16 1018 Last data filed at 08/22/16 1700  Gross per 24 hour  Intake              240 ml  Output                0 ml  Net              240 ml   Filed Weights   08/22/16 0300 08/22/16 1256 08/23/16 0435  Weight: 124 lb 12.5 oz (56.6 kg) 123 lb 10.9 oz (56.1 kg) 125 lb 14.1 oz (57.1 kg)    Physical Exam    GEN: Well nourished, frail, in no acute distress.  HEENT: Grossly normal.  Neck: Supple Cardiac: irregular Respiratory:  CTA GI: Soft, nontender, nondistended. MS: no deformity or atrophy. Skin: warm and dry, no rash. Neuro:  Strength and sensation are intact. Psych: AAOx3.  Normal affect.  Labs    CBC  Recent Labs  08/21/16 0247 08/22/16 0455  WBC 10.6* 9.3  HGB 13.5 13.1  HCT 41.8 41.2  MCV 93.9  94.9  PLT 258 124   Basic Metabolic Panel  Recent Labs  08/20/16 1237 08/20/16 1414 08/21/16 0247  NA 139  --  141  K 4.0  --  3.6  CL 103  --  108  CO2 29  --  26  GLUCOSE 129*  --  108*  BUN 12  --  10  CREATININE 0.65  --  0.64  CALCIUM 9.4  --  8.4*  MG  --  2.0  --   PHOS  --  2.8  --    Cardiac Enzymes  Recent Labs  08/20/16 1659  TROPONINI 0.12*   Thyroid Function Tests  Recent Labs  08/20/16 1414  TSH 1.970    Telemetry    Atrial fibrillation; rate mildly elevated with ambulation - Personally Reviewed      Patient Profile     82 year old female admitted with atrial fibrillation and also found to have lung mass.  Assessment & Plan  1 persistent atrial fibrillation-patient remains in atrial fibrillation but her rate is much better; mildly elevated with ambulation. Continue Cardizem 180 mg daily. Continue digoxin. Continue apixaban. Await results of echocardiogram. TSH normal. If her rate is controlled and she is asymptomatic I would favor rate control and anticoagulation for now as she will require further workup of lung mass including possible biopsy which will require temporary discontinuation of anticoagulation. CHADSvasc 4.   2 lung mass-further workup per pulmonary.  3 hypertension-blood pressure controlled. Continue present medications.   Signed, Kirk Ruths, MD  08/23/2016, 10:18 AM

## 2016-08-23 NOTE — Evaluation (Addendum)
Physical Therapy Evaluation Patient Details Name: Jodi Burns MRN: [id number] DOB: 1926/05/21 Today's Date: 08/23/2016   History of Present Illness  82 y.o. female pmh significant for afib and COPD, former smoker, who presented with sob. URI 3 wks ago. Gradually weak over last 2 days. Dizziness.   Clinical Impression  Pt admitted with above diagnosis. Pt currently with functional limitations due to the deficits listed below (see PT Problem List). Pt is at min guard to min assist level for all functional mobility.  HR 138 with mobility.  Pt states she just feels weak. Pt will benefit from skilled PT to increase their independence and safety with mobility to allow discharge to the venue listed below.  Her desire is to return home. She has 13 steps to enter her home. If unable to complete stair training, may need to consider SNF placement for continued therapy.     Follow Up Recommendations Home health PT;Supervision/Assistance - 24 hour    Equipment Recommendations  None recommended by PT    Recommendations for Other Services       Precautions / Restrictions Precautions Precautions: Fall      Mobility  Bed Mobility Overal bed mobility: Needs Assistance Bed Mobility: Supine to Sit;Sit to Supine     Supine to sit: Min assist;HOB elevated Sit to supine: Min assist   General bed mobility comments: +rail  Transfers Overall transfer level: Needs assistance Equipment used: Straight cane Transfers: Sit to/from Stand Sit to Stand: Min guard         General transfer comment: increased time to stabilize initial standing balance.  Ambulation/Gait Ambulation/Gait assistance: Min guard Ambulation Distance (Feet): 75 Feet Assistive device: Straight cane Gait Pattern/deviations: Step-through pattern;Decreased stride length Gait velocity: decreased Gait velocity interpretation: Below normal speed for age/gender General Gait Details: Fatigues quickly. May benefit from RW.  She has one at home but prefers her cane.  Stairs            Wheelchair Mobility    Modified Rankin (Stroke Patients Only)       Balance Overall balance assessment: Needs assistance Sitting-balance support: No upper extremity supported;Feet supported Sitting balance-Leahy Scale: Good     Standing balance support: Single extremity supported;During functional activity Standing balance-Leahy Scale: Fair                               Pertinent Vitals/Pain Pain Assessment: No/denies pain    Home Living Family/patient expects to be discharged to:: Private residence Living Arrangements: Spouse/significant other Available Help at Discharge: Family;Available 24 hours/day Type of Home: House Home Access: Stairs to enter Entrance Stairs-Rails: Right;Left;Can reach both Entrance Stairs-Number of Steps: 13 Home Layout: One level Home Equipment: Walker - 2 wheels;Cane - single point;Shower seat      Prior Function Level of Independence: Independent with assistive device(s)         Comments: Ambulated with cane.     Hand Dominance        Extremity/Trunk Assessment   Upper Extremity Assessment Upper Extremity Assessment: Generalized weakness    Lower Extremity Assessment Lower Extremity Assessment: Generalized weakness    Cervical / Trunk Assessment Cervical / Trunk Assessment: Kyphotic  Communication   Communication: HOH  Cognition Arousal/Alertness: Awake/alert Behavior During Therapy: WFL for tasks assessed/performed Overall Cognitive Status: Within Functional Limits for tasks assessed  General Comments      Exercises     Assessment/Plan    PT Assessment Patient needs continued PT services  PT Problem List Decreased strength;Decreased activity tolerance;Decreased balance;Decreased mobility;Cardiopulmonary status limiting activity          PT Treatment Interventions DME instruction;Gait training;Stair  training;Functional mobility training;Balance training;Therapeutic exercise;Patient/family education;Therapeutic activities    PT Goals (Current goals can be found in the Care Plan section)  Acute Rehab PT Goals Patient Stated Goal: home PT Goal Formulation: With patient/family Time For Goal Achievement: 09/06/16 Potential to Achieve Goals: Fair    Frequency Min 3X/week   Barriers to discharge        Co-evaluation               End of Session Equipment Utilized During Treatment: Gait belt Activity Tolerance: Patient limited by fatigue Patient left: in bed;with bed alarm set;with family/visitor present;with call bell/phone within reach Nurse Communication: Mobility status         Time: 1100-1114 PT Time Calculation (min) (ACUTE ONLY): 14 min   Charges:   PT Evaluation $PT Eval Moderate Complexity: 1 Procedure     PT G CodesLorriane Burns 08/23/2016, 12:47 PM

## 2016-08-23 NOTE — Progress Notes (Signed)
  Echocardiogram 2D Echocardiogram has been performed.  Jennette Dubin 08/23/2016, 10:28 AM

## 2016-08-23 NOTE — Care Management Note (Signed)
Case Management Note Marvetta Gibbons RN, BSN Unit 2W-Case Manager [phone]  Patient Details  Name: Jodi Burns MRN: [id number] Date of Birth: [birth year]  Subjective/Objective:  Pt admitted with afib/SOB                  Action/Plan: PTA pt lived at home with spouse- has been started on Eliquis- per insurance check- S/W [name] @ [place] # [id number]   ELIQUIS 2.5 MG BID 30 / 60 TAB   COVER- YES  CO-PAY- $ 35.00  TIER- 3 DRUG  PRIOR APPROVAL- YES # [phone]  PHARMACY : [facility], [facility] , [facility] AND [facility]   Spoke with pt/son/grandson at bedside- per conversation pt uses can mostly, but also has RW at home, open to [facility] services but would like to wait for husband to be present. Discussed Eliquis coverage and 30 day free card given to son to use on discharge. CM to continue to follow for d/c needs.    Expected Discharge Date:                Expected Discharge Plan:  [place]  In-House Referral:     Discharge planning Services  CM Consult, Medication Assistance  Post Acute Care Choice:  Home Health Choice offered to:     DME Arranged:    DME Agency:     HH Arranged:    HH Agency:     Status of Service:  In process, will continue to follow  If discussed at Long Length of Stay Meetings, dates discussed:    Additional Comments:  Dawayne Patricia, RN 08/23/2016, 3:42 PM

## 2016-08-23 NOTE — Progress Notes (Signed)
PROGRESS NOTE    Jodi Burns  BHA:[id number] DOB: Nov 14, 1925 DOA: 08/20/2016 PCP: Jerlyn Ly, MD     Brief Narrative:  82 y.o. ? AFib chad2vasc2=4, on Digoxin and Apixaban former smoker/GOLD COPD II who presented with SOB.  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    On presentation, she was in A Fib RVR.  CT chest revealed new lung masses in RUL and irreg LUL opacity--infection felt less likely Cardiology and pulmonology have been consulted.     Assessment & Plan:   Principal Problem:   DOE (dyspnea on exertion) Active Problems:   Allergic rhinitis due to pollen   PAF (paroxysmal atrial fibrillation) (HCC)   Essential hypertension   Atrial fibrillation with RVR (HCC)   Lung mass   Elevated troponin   COPD GOLD II   Afib with RVR, rate improved now  -CHASVASC = 4.  -Did not tolerate IV cardizem due to hypotension.  -Cardiology following -On cardizem PO 180 daily and digoxin 125 mcg--meds being adjusted by cardiology -Eliquis started 2.5 bid this admit -Echo pending so far -transfered out of stepdown 12/27  Lung mass/GOLD COPD stg II -CT chest revealed: right upper lobe lung mass, consistent with primary bronchogenic carcinoma as well as irregular left upper lobe opacity which could represent a synchronous primary bronchogenic carcinoma -Pulmonology consulted. Will need PET as outpatient and follow up with pulm in 2 weeks  Continue Dulera 2 puffs twice a day, Atrovent nasal spray one to 2 when necessary allergy  Elevated troponin -Likely due to demand, trended down to 0.12  COPD -Without acute exacerbation. Continue home inhaler.   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 -At baseline does require some assistance -We will ask therapy to evaluate the patient in consult and determine whether the patient will benefit from skilled care  placement  Adult FTT -Patient has potential malignancy -Patient has had significant decline from prior shoulder surgeries after deep anesthesia per her husband -will need OP follow up and discussions -We will ask therapy to see the patient has above   DVT prophylaxis: eliquis  Code Status: Full Family Communication: husband at bedside  Disposition Plan: hopefully discharge to home 12/27 if rate remains well controlled with ambulation   Consultants:   Cardiology  Pulmonology  Procedures:   None  Antimicrobials:   None     Subjective:    Objective: Vitals:   08/22/16 1256 08/22/16 1931 08/22/16 2034 08/23/16 0435  BP: (!) 141/53  (!) 142/50 (!) 145/58  Pulse: (!) 45  (!) 45 74  Resp: '16  18 20  '$ Temp: 97.8 F (36.6 C)  98.1 F (36.7 C) 98.4 F (36.9 C)  TempSrc: Oral  Oral Oral  SpO2: 96% 95% 97% 97%  Weight: 56.1 kg (123 lb 10.9 oz)   57.1 kg (125 lb 14.1 oz)  Height:        Intake/Output Summary (Last 24 hours) at 08/23/16 0746 Last data filed at 08/22/16 1700  Gross per 24 hour  Intake              240 ml  Output              150 ml  Net               90 ml   Filed Weights   08/22/16 0300 08/22/16 1256 08/23/16 0435  Weight: 56.6 kg (  124 lb 12.5 oz) 56.1 kg (123 lb 10.9 oz) 57.1 kg (125 lb 14.1 oz)    Examination:  General exam: Appears calm and comfortable  Respiratory system: Clear to auscultation. Respiratory effort normal. No wheeze nor rales nor rhonchi Cardiovascular system: S1 & S2 heard, Irregular rhythm, rate in 90s-110's. No JVD, murmurs, rubs, gallops or clicks. No pedal edema. Gastrointestinal system: Abdomen is nondistended, soft and nontender. No organomegaly  Central nervous system: Alert and oriented. No focal neurological deficits. Extremities: Symmetric 5 x 5 power. Skin: No rashes, lesions or ulcers Psychiatry: Judgement and insight appear normal. Mood & affect appropriate.   Data Reviewed: I have personally reviewed following  labs and imaging studies  CBC:  Recent Labs Lab 08/20/16 1237 08/21/16 0247 08/22/16 0455  WBC 11.7* 10.6* 9.3  HGB 16.4* 13.5 13.1  HCT 49.9* 41.8 41.2  MCV 94.5 93.9 94.9  PLT 285 258 938   Basic Metabolic Panel:  Recent Labs Lab 08/20/16 1237 08/20/16 1414 08/21/16 0247  NA 139  --  141  K 4.0  --  3.6  CL 103  --  108  CO2 29  --  26  GLUCOSE 129*  --  108*  BUN 12  --  10  CREATININE 0.65  --  0.64  CALCIUM 9.4  --  8.4*  MG  --  2.0  --   PHOS  --  2.8  --    GFR: Estimated Creatinine Clearance: 39.2 mL/min (by C-G formula based on SCr of 0.64 mg/dL). Liver Function Tests: No results for input(s): AST, ALT, ALKPHOS, BILITOT, PROT, ALBUMIN in the last 168 hours. No results for input(s): LIPASE, AMYLASE in the last 168 hours. No results for input(s): AMMONIA in the last 168 hours. Coagulation Profile: No results for input(s): INR, PROTIME in the last 168 hours. Cardiac Enzymes:  Recent Labs Lab 08/20/16 1659  TROPONINI 0.12*   BNP (last 3 results) No results for input(s): PROBNP in the last 8760 hours. HbA1C: No results for input(s): HGBA1C in the last 72 hours. CBG: No results for input(s): GLUCAP in the last 168 hours. Lipid Profile: No results for input(s): CHOL, HDL, LDLCALC, TRIG, CHOLHDL, LDLDIRECT in the last 72 hours. Thyroid Function Tests:  Recent Labs  08/20/16 1414  TSH 1.970   Anemia Panel: No results for input(s): VITAMINB12, FOLATE, FERRITIN, TIBC, IRON, RETICCTPCT in the last 72 hours. Sepsis Labs: No results for input(s): PROCALCITON, LATICACIDVEN in the last 168 hours.  Recent Results (from the past 240 hour(s))  MRSA PCR Screening     Status: None   Collection Time: 08/20/16  9:00 PM  Result Value Ref Range Status   MRSA by PCR NEGATIVE NEGATIVE Final    Comment:        The GeneXpert MRSA Assay (FDA approved for NASAL specimens only), is one component of a comprehensive MRSA colonization surveillance program. It is  not intended to diagnose MRSA infection nor to guide or monitor treatment for MRSA infections.        Radiology Studies: No results found.    Scheduled Meds: . apixaban  2.5 mg Oral BID  . digoxin  125 mcg Oral Daily  . diltiazem  180 mg Oral Daily  . dorzolamide  1 drop Both Eyes BID  . mometasone-formoterol  2 puff Inhalation BID  . sodium chloride flush  3 mL Intravenous Q12H  . timolol  1 drop Both Eyes BID   Continuous Infusions:    LOS: 3 days  Time spent: 30 minutes   Nita Sells, MD [facility] [email or web address] Password [facility] 08/23/2016, 7:46 AM

## 2016-08-24 LAB — BASIC METABOLIC PANEL
ANION GAP: 8 (ref 5–15)
BUN: 8 mg/dL (ref 6–20)
CALCIUM: 8.8 mg/dL — AB (ref 8.9–10.3)
CO2: 27 mmol/L (ref 22–32)
Chloride: 106 mmol/L (ref 101–111)
Creatinine, Ser: 0.63 mg/dL (ref 0.44–1.00)
Glucose, Bld: 113 mg/dL — ABNORMAL HIGH (ref 65–99)
Potassium: 4.1 mmol/L (ref 3.5–5.1)
SODIUM: 141 mmol/L (ref 135–145)

## 2016-08-24 MED ORDER — AMIODARONE HCL 200 MG PO TABS
400.0000 mg | ORAL_TABLET | Freq: Two times a day (BID) | ORAL | Status: DC
  Administered 2016-08-24 – 2016-08-26 (×5): 400 mg via ORAL
  Filled 2016-08-24 (×5): qty 2

## 2016-08-24 MED ORDER — DIGOXIN 125 MCG PO TABS
0.0625 mg | ORAL_TABLET | Freq: Every day | ORAL | Status: DC
  Filled 2016-08-24: qty 1

## 2016-08-24 NOTE — Progress Notes (Signed)
Physical Therapy Treatment Patient Details Name: Jodi Burns MRN: [id number] DOB: [birth year] Today's Date: 08/24/2016    History of Present Illness 82 y.o. female pmh significant for afib and COPD, former smoker, who presented with sob. URI 3 wks ago. Gradually weak over last 2 days. Dizziness.     PT Comments    Progressing steadily.  HR maintained at acceptable levels with minimal exertion, but elevates into the 130's to low 140's with moderate exertion.  Pt notably fatigued, but recovers quickly with rest.   Follow Up Recommendations  Home health PT;Supervision/Assistance - 24 hour     Equipment Recommendations  None recommended by PT    Recommendations for Other Services       Precautions / Restrictions Precautions Precautions: Fall    Mobility  Bed Mobility Overal bed mobility: Needs Assistance Bed Mobility: Sit to Supine       Sit to supine: Min guard      Transfers Overall transfer level: Needs assistance Equipment used: Rolling walker (2 wheeled) Transfers: Sit to/from Stand Sit to Stand: Min guard            Ambulation/Gait Ambulation/Gait assistance: Min guard Ambulation Distance (Feet): 100 Feet (x2) Assistive device: Rolling walker (2 wheeled) Gait Pattern/deviations: Step-through pattern Gait velocity: decreased   General Gait Details: generally steady with RW.  Once instructed, maintained safe use of the RW   Stairs Stairs: Yes   Stair Management: One rail Right;Two rails;Alternating pattern;Step to pattern;Forwards Number of Stairs: 12 General stair comments: needed min assist to feel safe going down the steps  Wheelchair Mobility    Modified Rankin (Stroke Patients Only)       Balance Overall balance assessment: Needs assistance Sitting-balance support: No upper extremity supported;Feet supported Sitting balance-Leahy Scale: Good     Standing balance support: Single extremity supported;During functional  activity Standing balance-Leahy Scale: Fair Standing balance comment: prefers external support.                    Cognition Arousal/Alertness: Awake/alert Behavior During Therapy: WFL for tasks assessed/performed Overall Cognitive Status: Within Functional Limits for tasks assessed                      Exercises      General Comments General comments (skin integrity, edema, etc.): SpO2 through out between 94-97%, HR/EHR 60's-80's with minimal exertion and as high as 141 bpm in afib      Pertinent Vitals/Pain Pain Assessment: Faces Faces Pain Scale: No hurt    Home Living                      Prior Function            PT Goals (current goals can now be found in the care plan section) Acute Rehab PT Goals Patient Stated Goal: home PT Goal Formulation: With patient/family Time For Goal Achievement: 09/06/16 Potential to Achieve Goals: Fair Progress towards PT goals: Progressing toward goals    Frequency    Min 3X/week      PT Plan Current plan remains appropriate    Co-evaluation             End of Session   Activity Tolerance: Patient limited by fatigue;Patient tolerated treatment well       Time: 4097-3532 PT Time Calculation (min) (ACUTE ONLY): 38 min  Charges:  $Gait Training: 8-22 mins $Therapeutic Exercise: 8-22 mins $Therapeutic Activity: 8-22 mins  G CodesTessie Fass Zaeda Mcferran 08/24/2016, 11:48 AM 08/24/2016  Donnella Sham, [place] [phone]  (pager)

## 2016-08-24 NOTE — Progress Notes (Signed)
PROGRESS NOTE    Jodi Burns  HLK:[id number] DOB: 06-Jan-1926 DOA: 08/20/2016 PCP: Jerlyn Ly, MD     Brief Narrative:   82 y.o. ? AFib chad2vasc2=4, on Digoxin and Apixaban former smoker/GOLD COPD II who presented with SOB.  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   On presentation, she was in A Fib RVR.  CT chest revealed new lung masses in RUL and irreg LUL opacity--infection felt less likely Cardiology and pulmonology have been consulted.     Assessment & Plan:   Principal Problem:   DOE (dyspnea on exertion) Active Problems:   Allergic rhinitis due to pollen   PAF (paroxysmal atrial fibrillation) ([place])   Essential hypertension   Atrial fibrillation with RVR (HCC)   Lung mass   Elevated troponin   COPD GOLD II   Afib with RVR, rate improved now  -CHASVASC = 4.  -Did not tolerate IV cardizem due to hypotension.  -Cardiology following -On cardizem PO 180 daily and digoxin 0.0625 mg--meds being adjusted by cardiology--added Amiodarone 400 bid 12/28 -Eliquis started 2.5 bid this admit -Echo 12/27 shows ef 60-65%, slighlty elevated PASP  -transfered out of stepdown 12/27  Malaise Probably related to mild hypotension and Amiodarone loading I have advised this will probably improve  Lung mass/GOLD COPD stg II -CT chest revealed: right upper lobe lung mass, consistent with primary bronchogenic carcinoma as well as irregular left upper lobe opacity which could represent a synchronous primary bronchogenic carcinoma -Pulmonology consulted. Will need PET as outpatient and follow up with pulm in 2 weeks  -Continue Dulera 2 puffs twice a day, Atrovent nasal spray one to 2 when necessary allergy  Elevated troponin -Likely due to demand, trended down to 0.12  COPD -Without acute exacerbation. Continue home inhaler.   Legally blind -History of histoplasmosis  causing this -At baseline does require some assistance -We will ask therapy to evaluate the patient in consult and determine whether the patient will benefit from skilled care placement  Adult FTT -Patient has potential malignancy -significant decline from prior shoulder surgeries after deep anesthesia per her husband -will need OP follow up and discussions -ask therapy to see the patient has above   DVT prophylaxis: eliquis  Code Status: Full Family Communication: husband at bedside  Disposition Plan: hopefully discharge to home 12/27 if rate remains well controlled with ambulation   Consultants:   Cardiology  Pulmonology  Procedures:   None  Antimicrobials:   None     Subjective:  Fair Was looking ok earlier in the day Felt malaise and discomfort later on. No specific issues   Objective: Vitals:   08/24/16 1452 08/24/16 1455 08/24/16 1457 08/24/16 1459  BP: (!) 111/41 (!) 112/47 (!) 107/52 (!) 108/38  Pulse: 62 63 85 85  Resp:      Temp:      TempSrc:      SpO2:      Weight:      Height:        Intake/Output Summary (Last 24 hours) at 08/24/16 1545 Last data filed at 08/24/16 1300  Gross per 24 hour  Intake              960 ml  Output                0 ml  Net  960 ml   Filed Weights   08/22/16 1256 08/23/16 0435 08/24/16 0538  Weight: 56.1 kg (123 lb 10.9 oz) 57.1 kg (125 lb 14.1 oz) 54.8 kg (120 lb 12.8 oz)    Examination:  General exam: Appears calm and comfortable  Respiratory system: Clear to auscultation. Respiratory effort normal. No wheeze nor rales nor rhonchi Cardiovascular system: S1 & S2 heard, Irregular rhythm, rate in 90s-110's. No JVD, murmurs Gastrointestinal system: Abdomen is nondistended, soft and nontender. No organomegaly  Central nervous system: Alert and oriented. No focal neurological deficits. Extremities: Symmetric 5 x 5 power. Skin: No rashes, lesions or ulcers Psychiatry: Judgement and insight appear  normal. Mood & affect appropriate.   Data Reviewed: I have personally reviewed following labs and imaging studies  CBC:  Recent Labs Lab 08/20/16 1237 08/21/16 0247 08/22/16 0455  WBC 11.7* 10.6* 9.3  HGB 16.4* 13.5 13.1  HCT 49.9* 41.8 41.2  MCV 94.5 93.9 94.9  PLT 285 258 300   Basic Metabolic Panel:  Recent Labs Lab 08/20/16 1237 08/20/16 1414 08/21/16 0247 08/23/16 1046 08/24/16 0153  NA 139  --  141  --  141  K 4.0  --  3.6  --  4.1  CL 103  --  108  --  106  CO2 29  --  26  --  27  GLUCOSE 129*  --  108*  --  113*  BUN 12  --  10  --  8  CREATININE 0.65  --  0.64  --  0.63  CALCIUM 9.4  --  8.4*  --  8.8*  MG  --  2.0  --  1.9  --   PHOS  --  2.8  --   --   --    GFR: Estimated Creatinine Clearance: 39.2 mL/min (by C-G formula based on SCr of 0.63 mg/dL). Liver Function Tests: No results for input(s): AST, ALT, ALKPHOS, BILITOT, PROT, ALBUMIN in the last 168 hours. No results for input(s): LIPASE, AMYLASE in the last 168 hours. No results for input(s): AMMONIA in the last 168 hours. Coagulation Profile: No results for input(s): INR, PROTIME in the last 168 hours. Cardiac Enzymes:  Recent Labs Lab 08/20/16 1659  TROPONINI 0.12*   BNP (last 3 results) No results for input(s): PROBNP in the last 8760 hours. HbA1C: No results for input(s): HGBA1C in the last 72 hours. CBG: No results for input(s): GLUCAP in the last 168 hours. Lipid Profile: No results for input(s): CHOL, HDL, LDLCALC, TRIG, CHOLHDL, LDLDIRECT in the last 72 hours. Thyroid Function Tests: No results for input(s): TSH, T4TOTAL, FREET4, T3FREE, THYROIDAB in the last 72 hours. Anemia Panel: No results for input(s): VITAMINB12, FOLATE, FERRITIN, TIBC, IRON, RETICCTPCT in the last 72 hours. Sepsis Labs: No results for input(s): PROCALCITON, LATICACIDVEN in the last 168 hours.  Recent Results (from the past 240 hour(s))  MRSA PCR Screening     Status: None   Collection Time: 08/20/16   9:00 PM  Result Value Ref Range Status   MRSA by PCR NEGATIVE NEGATIVE Final    Comment:        The GeneXpert MRSA Assay (FDA approved for NASAL specimens only), is one component of a comprehensive MRSA colonization surveillance program. It is not intended to diagnose MRSA infection nor to guide or monitor treatment for MRSA infections.        Radiology Studies: No results found.    Scheduled Meds: . amiodarone  400 mg Oral BID  .  apixaban  2.5 mg Oral BID  . [START ON 08/25/2016] digoxin  0.0625 mg Oral Daily  . diltiazem  180 mg Oral Daily  . dorzolamide  1 drop Both Eyes BID  . mometasone-formoterol  2 puff Inhalation BID  . sodium chloride flush  3 mL Intravenous Q12H  . timolol  1 drop Both Eyes BID   Continuous Infusions:    LOS: 4 days    Time spent: 20 minutes   Nita Sells, MD [facility] [email or web address] Password [facility] 08/24/2016, 3:45 PM

## 2016-08-24 NOTE — Care Management Important Message (Signed)
Important Message  Patient Details  Name: Jodi Burns MRN: [id number] Date of Birth: July 31, 1926   Medicare Important Message Given:  Yes    Nathen May 08/24/2016, 10:47 AM

## 2016-08-24 NOTE — Progress Notes (Addendum)
Patient Name: Jodi Burns Date of Encounter: 08/24/2016  Primary Cardiologist: Dr [facility] Problem List     Principal Problem:   DOE (dyspnea on exertion) Active Problems:   Allergic rhinitis due to pollen   PAF (paroxysmal atrial fibrillation) (HCC)   Essential hypertension   Atrial fibrillation with RVR (HCC)   Lung mass   Elevated troponin   COPD GOLD II     Subjective   No real complaints. Has not yet ambulated. A chest ago home. Both a daughter and [name] (retired pediatrician) her husband are in room.  Inpatient Medications    Scheduled Meds: . apixaban  2.5 mg Oral BID  . digoxin  125 mcg Oral Daily  . diltiazem  180 mg Oral Daily  . dorzolamide  1 drop Both Eyes BID  . mometasone-formoterol  2 puff Inhalation BID  . sodium chloride flush  3 mL Intravenous Q12H  . timolol  1 drop Both Eyes BID   Continuous Infusions:  PRN Meds: acetaminophen **OR** acetaminophen, ipratropium   Vital Signs    Vitals:   08/23/16 2203 08/24/16 0538 08/24/16 0719 08/24/16 0901  BP: 115/60 (!) 150/83 125/70 136/69  Pulse: (!) 133 (!) 117 (!) 134 (!) 136  Resp: '18 18  18  '$ Temp: 98.9 F (37.2 C) 97.5 F (36.4 C)  97.5 F (36.4 C)  TempSrc: Oral Oral  Oral  SpO2: 95% 98%  97%  Weight:  120 lb 12.8 oz (54.8 kg)    Height:        Intake/Output Summary (Last 24 hours) at 08/24/16 0934 Last data filed at 08/23/16 2000  Gross per 24 hour  Intake              240 ml  Output                0 ml  Net              240 ml   Filed Weights   08/22/16 1256 08/23/16 0435 08/24/16 0538  Weight: 123 lb 10.9 oz (56.1 kg) 125 lb 14.1 oz (57.1 kg) 120 lb 12.8 oz (54.8 kg)    Physical Exam    GEN: Well nourished, frail, in no acute distress.  HEENT: Grossly normal.  Neck: Supple Cardiac: irregular Respiratory:  The lungs are clear. No wheezing, rales, or other complaints. GI: Soft, nontender, nondistended. MS: no deformity or atrophy. Skin: warm and  dry, no rash. Neuro:  Strength and sensation are intact. Psych: AAOx3.  Normal affect.  Labs    CBC  Recent Labs  08/22/16 0455  WBC 9.3  HGB 13.1  HCT 41.2  MCV 94.9  PLT 329   Basic Metabolic Panel  Recent Labs  08/23/16 1046 08/24/16 0153  NA  --  141  K  --  4.1  CL  --  106  CO2  --  27  GLUCOSE  --  113*  BUN  --  8  CREATININE  --  0.63  CALCIUM  --  8.8*  MG 1.9  --    Cardiac Enzymes No results for input(s): CKTOTAL, CKMB, CKMBINDEX, TROPONINI in the last 72 hours. Thyroid Function Tests No results for input(s): TSH, T4TOTAL, T3FREE, THYROIDAB in the last 72 hours.  Invalid input(s): FREET3  Telemetry    Atrial fibrillation With resting heart rate still greater than 105 bpm.   ECHOCARDIOGRAM: 08/23/16 Study Conclusions  - Left ventricle: The cavity size was normal. There  was mild   concentric hypertrophy. Systolic function was vigorous. The   estimated ejection fraction was in the range of 65% to 70%. Wall   motion was normal; there were no regional wall motion   abnormalities. The study was not technically sufficient to allow   evaluation of LV diastolic dysfunction due to atrial   fibrillation. - Aortic valve: Trileaflet; normal thickness leaflets. There was no   regurgitation. - Aortic root: The aortic root was normal in size. - Mitral valve: Calcified annulus. Mildly thickened leaflets .   There was mild regurgitation. - Left atrium: The atrium was normal in size. - Right ventricle: The cavity size was normal. Wall thickness was   normal. Systolic function was normal. - Right atrium: The atrium was normal in size. - Tricuspid valve: There was mild regurgitation. - Pulmonary arteries: Systolic pressure was mildly increased. PA   peak pressure: 32 mm Hg (S). - Inferior vena cava: The vessel was normal in size. - Pericardium, extracardiac: There was no pericardial effusion.  Patient Profile     82 year old female admitted with atrial  fibrillation and also found to have lung mass.  Assessment & Plan    1. Persistent atrial flutter -still with poor rate control despite diltiazem and digoxin. Plan to add amiodarone. Will decrease digoxin dose intensity to .0625 mg daily. After speaking with the husband, we will try to avoid electrical cardioversion. She needs better rate control prior to discharge. She wants to be at home for New Year's Eve which is also her birthday.  2. Probable lung cancer - further workup per pulmonary as OP.  3. Hypertension -blood pressure controlled. Continue present medications.  4. Acute on chronic diastolic heart failure - without evidence of volume overload.   Signed, Sinclair Grooms, MD  08/24/2016, 9:34 AM

## 2016-08-24 NOTE — Progress Notes (Signed)
Patient said she is not feeling well. NO chest pain or SOB, vital signs stable Samtani J MD notified, patient resting quietly in bed will continue to monitor.

## 2016-08-25 DIAGNOSIS — J449 Chronic obstructive pulmonary disease, unspecified: Secondary | ICD-10-CM

## 2016-08-25 DIAGNOSIS — I10 Essential (primary) hypertension: Secondary | ICD-10-CM

## 2016-08-25 LAB — BASIC METABOLIC PANEL
Anion gap: 9 (ref 5–15)
BUN: 8 mg/dL (ref 6–20)
CALCIUM: 8.9 mg/dL (ref 8.9–10.3)
CO2: 25 mmol/L (ref 22–32)
CREATININE: 0.57 mg/dL (ref 0.44–1.00)
Chloride: 104 mmol/L (ref 101–111)
Glucose, Bld: 105 mg/dL — ABNORMAL HIGH (ref 65–99)
Potassium: 3.9 mmol/L (ref 3.5–5.1)
SODIUM: 138 mmol/L (ref 135–145)

## 2016-08-25 LAB — CBC
HEMATOCRIT: 37.1 % (ref 36.0–46.0)
Hemoglobin: 11.9 g/dL — ABNORMAL LOW (ref 12.0–15.0)
MCH: 31.2 pg (ref 26.0–34.0)
MCHC: 32.1 g/dL (ref 30.0–36.0)
MCV: 97.1 fL (ref 78.0–100.0)
PLATELETS: 227 10*3/uL (ref 150–400)
RBC: 3.82 MIL/uL — ABNORMAL LOW (ref 3.87–5.11)
RDW: 14.8 % (ref 11.5–15.5)
WBC: 12 10*3/uL — ABNORMAL HIGH (ref 4.0–10.5)

## 2016-08-25 MED ORDER — AMIODARONE HCL 400 MG PO TABS: 400.0000 mg | ORAL_TABLET | Freq: Two times a day (BID) | ORAL | 0 refills | Status: DC

## 2016-08-25 MED ORDER — DILTIAZEM HCL ER COATED BEADS 180 MG PO CP24: 180.0000 mg | ORAL_CAPSULE | Freq: Every day | ORAL | 0 refills | Status: DC

## 2016-08-25 MED ORDER — APIXABAN 2.5 MG PO TABS: 2.5000 mg | ORAL_TABLET | Freq: Two times a day (BID) | ORAL | 0 refills | Status: AC

## 2016-08-25 NOTE — Progress Notes (Signed)
Patient ambulated in hall with use of front wheel walker from her room to the end of the nurse's station with RN supervision. Patient stated that she felt "weak." Patient heart rate before ambulating was 120s. Once patient assisted back into bed, patient's heart rate between 90-105. Patient stated that she felt "dizzy." Patient voices no other complaints and stated that after laying in bed for several minutes, dizziness subsided.

## 2016-08-25 NOTE — Progress Notes (Signed)
Patient Name: Jodi Burns Date of Encounter: 08/25/2016  Primary Cardiologist: Dr [facility] Problem List     Principal Problem:   DOE (dyspnea on exertion) Active Problems:   Allergic rhinitis due to pollen   PAF (paroxysmal atrial fibrillation) (HCC)   Essential hypertension   Atrial fibrillation with RVR (HCC)   Lung mass   Elevated troponin   COPD GOLD II     Subjective   Ambulated yesterday. Her major complaint is significant fatigue. She denies chest discomfort and dyspnea. Daughter and husband are present in the room during today's evaluation.  We spent significant time discussing treatment options  Inpatient Medications    Scheduled Meds: . amiodarone  400 mg Oral BID  . apixaban  2.5 mg Oral BID  . digoxin  0.0625 mg Oral Daily  . diltiazem  180 mg Oral Daily  . dorzolamide  1 drop Both Eyes BID  . mometasone-formoterol  2 puff Inhalation BID  . sodium chloride flush  3 mL Intravenous Q12H  . timolol  1 drop Both Eyes BID   Continuous Infusions:  PRN Meds: acetaminophen **OR** acetaminophen, ipratropium   Vital Signs    Vitals:   08/24/16 1941 08/25/16 0543 08/25/16 0924 08/25/16 1000  BP:  130/79    Pulse:  (!) 49  (!) 120  Resp:      Temp:  98 F (36.7 C)    TempSrc:  Oral    SpO2: 96%  96%   Weight:  115 lb 1.6 oz (52.2 kg)    Height:        Intake/Output Summary (Last 24 hours) at 08/25/16 1048 Last data filed at 08/25/16 0901  Gross per 24 hour  Intake              840 ml  Output                0 ml  Net              840 ml   Filed Weights   08/23/16 0435 08/24/16 0538 08/25/16 0543  Weight: 125 lb 14.1 oz (57.1 kg) 120 lb 12.8 oz (54.8 kg) 115 lb 1.6 oz (52.2 kg)    Physical Exam    GEN: Well nourished, frail, in no acute distress.  HEENT: Grossly normal.  Neck: Supple Cardiac: irregular Respiratory:  The lungs are clear. No wheezing, rales, or other complaints. GI: Soft, nontender, nondistended. MS: no  deformity or atrophy. Skin: warm and dry, no rash. Neuro:  Strength and sensation are intact. Psych: AAOx3.  Normal affect.  Labs    CBC  Recent Labs  08/25/16 0445  WBC 12.0*  HGB 11.9*  HCT 37.1  MCV 97.1  PLT 657   Basic Metabolic Panel  Recent Labs  08/23/16 1046 08/24/16 0153  NA  --  141  K  --  4.1  CL  --  106  CO2  --  27  GLUCOSE  --  113*  BUN  --  8  CREATININE  --  0.63  CALCIUM  --  8.8*  MG 1.9  --    Cardiac Enzymes No results for input(s): CKTOTAL, CKMB, CKMBINDEX, TROPONINI in the last 72 hours. Thyroid Function Tests No results for input(s): TSH, T4TOTAL, T3FREE, THYROIDAB in the last 72 hours.  Invalid input(s): FREET3  Telemetry    Atrial fibrillation With resting heart rate still greater than 105 bpm.   ECHOCARDIOGRAM: 08/23/16 Study Conclusions  -  Left ventricle: The cavity size was normal. There was mild   concentric hypertrophy. Systolic function was vigorous. The   estimated ejection fraction was in the range of 65% to 70%. Wall   motion was normal; there were no regional wall motion   abnormalities. The study was not technically sufficient to allow   evaluation of LV diastolic dysfunction due to atrial   fibrillation. - Aortic valve: Trileaflet; normal thickness leaflets. There was no   regurgitation. - Aortic root: The aortic root was normal in size. - Mitral valve: Calcified annulus. Mildly thickened leaflets .   There was mild regurgitation. - Left atrium: The atrium was normal in size. - Right ventricle: The cavity size was normal. Wall thickness was   normal. Systolic function was normal. - Right atrium: The atrium was normal in size. - Tricuspid valve: There was mild regurgitation. - Pulmonary arteries: Systolic pressure was mildly increased. PA   peak pressure: 32 mm Hg (S). - Inferior vena cava: The vessel was normal in size. - Pericardium, extracardiac: There was no pericardial effusion.  Patient Profile       82 year old female admitted with atrial fibrillation and also found to have lung mass.  Assessment & Plan    1. Persistent atrial flutter -Duration is unknown. She has better rate control. Amiodarone was added orally yesterday.. Will stop digoxin. After speaking with patient and family, the following treatment goal has been established:   Continue to load with amiodarone (400 mg twice a day for 5 days then 200 mg twice a day thereafter). Discussed potential toxicity with the patient and family.  Continue diltiazem.  Continue anticoagulation  Ambulate today and if tolerable energy level, planned discharge tomorrow with support from home health.   If discharged, the ultimate goal for atrial flutter therapy will be rate control or elective electrical cardioversion in 4 weeks depending upon her clinical course. If discharged, he will need a cardiology transition of care in 5-7 days.  If in energy level is intolerable and we are unable to discharge, will plan TEE guided electrical cardioversion mid next week after several additional doses of amiodarone.  2. Probable lung cancer - further workup per pulmonary as OP.  3. Hypertension -blood pressure controlled.   4. Acute on chronic diastolic heart failure - without evidence of volume overload.   Signed, Sinclair Grooms, MD  08/25/2016, 10:48 AM

## 2016-08-25 NOTE — Progress Notes (Signed)
PROGRESS NOTE    Jodi Burns  QZR:[id number] DOB: [birth year] DOA: 08/20/2016 PCP: Jerlyn Ly, MD     Brief Narrative:   82 y.o. ? AFib chad2vasc2=4, on Digoxin and Apixaban former smoker/GOLD COPD II who presented with SOB.  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   On presentation, she was in A Fib RVR.  CT chest revealed new lung masses in RUL and irreg LUL opacity--infection felt less likely Cardiology and pulmonology have been consulted.     Assessment & Plan:   Principal Problem:   DOE (dyspnea on exertion) Active Problems:   Allergic rhinitis due to pollen   PAF (paroxysmal atrial fibrillation) ([place])   Essential hypertension   Atrial fibrillation with RVR (HCC)   Lung mass   Elevated troponin   COPD GOLD II   Afib with RVR, rate improved now  -CHASVASC = 4.  -Did not tolerate IV cardizem due to hypotension.  -Cardiology following --meds being adjusted by cardiology--added Amiodarone 400 bid 12/28 [see note dated 12/29] -Eliquis started 2.5 bid this admit -Echo 12/27 shows ef 60-65%, slighlty elevated PASP  -transfered out of stepdown 12/27  Malaise Probably related to mild hypotension and Amiodarone loading I have advised this will probably improve  Lung mass/GOLD COPD stg II -CT chest revealed: right upper lobe lung mass, consistent with primary bronchogenic carcinoma as well as irregular left upper lobe opacity which could represent a synchronous primary bronchogenic carcinoma -Pulmonology consulted. Will need PET as outpatient and follow up with pulm in 2 weeks  -Continue Dulera 2 puffs twice a day, Atrovent nasal spray one to 2 when necessary allergy  Elevated troponin -Likely due to demand, trended down to 0.12  COPD -Without acute exacerbation. Continue home inhaler.   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 -At baseline  does require some assistance -We will ask therapy to evaluate the patient in consult and determine whether the patient will benefit from skilled care placement  Adult FTT -Patient has potential malignancy -significant decline from prior shoulder surgeries after deep anesthesia per her husband -will need OP follow up and discussions -ask therapy to see the patient has above   DVT prophylaxis: eliquis  Code Status: Full Family Communication: husband at bedside  Disposition Plan: hopefully discharge to home 12/28 if rate remains well controlled with ambulation   Consultants:   Cardiology  Pulmonology  Procedures:   None  Antimicrobials:   None     Subjective:  Ambulated today but felt weak when goiong to the nursing station No cp nor SOB Overall feels better than yesterday   Objective: Vitals:   08/24/16 1457 08/24/16 1459 08/24/16 1941 08/25/16 0543  BP: (!) 107/52 (!) 108/38  130/79  Pulse: 85 85  (!) 49  Resp:      Temp:    98 F (36.7 C)  TempSrc:    Oral  SpO2:   96%   Weight:    52.2 kg (115 lb 1.6 oz)  Height:        Intake/Output Summary (Last 24 hours) at 08/25/16 0758 Last data filed at 08/24/16 2217  Gross per 24 hour  Intake             1080 ml  Output                0 ml  Net  1080 ml   Filed Weights   08/23/16 0435 08/24/16 0538 08/25/16 0543  Weight: 57.1 kg (125 lb 14.1 oz) 54.8 kg (120 lb 12.8 oz) 52.2 kg (115 lb 1.6 oz)    Examination:  General exam: Appears calm and comfortable  Respiratory system: Clear to auscultation. Respiratory effort normal. No wheeze nor rales nor rhonchi Cardiovascular system: S1 & S2 heard, Irregular rhythm, rate in 90s-110's. No JVD, murmurs Gastrointestinal system: Abdomen is nondistended, soft and nontender. No organomegaly  Central nervous system: Alert and oriented. No focal neurological deficits. Extremities: Symmetric 5 x 5 power. Skin: No rashes, lesions or ulcers Psychiatry: Judgement  and insight appear normal. Mood & affect appropriate.   Data Reviewed: I have personally reviewed following labs and imaging studies  CBC:  Recent Labs Lab 08/20/16 1237 08/21/16 0247 08/22/16 0455 08/25/16 0445  WBC 11.7* 10.6* 9.3 12.0*  HGB 16.4* 13.5 13.1 11.9*  HCT 49.9* 41.8 41.2 37.1  MCV 94.5 93.9 94.9 97.1  PLT 285 258 236 299   Basic Metabolic Panel:  Recent Labs Lab 08/20/16 1237 08/20/16 1414 08/21/16 0247 08/23/16 1046 08/24/16 0153  NA 139  --  141  --  141  K 4.0  --  3.6  --  4.1  CL 103  --  108  --  106  CO2 29  --  26  --  27  GLUCOSE 129*  --  108*  --  113*  BUN 12  --  10  --  8  CREATININE 0.65  --  0.64  --  0.63  CALCIUM 9.4  --  8.4*  --  8.8*  MG  --  2.0  --  1.9  --   PHOS  --  2.8  --   --   --    GFR: Estimated Creatinine Clearance: 39.2 mL/min (by C-G formula based on SCr of 0.63 mg/dL). Liver Function Tests: No results for input(s): AST, ALT, ALKPHOS, BILITOT, PROT, ALBUMIN in the last 168 hours. No results for input(s): LIPASE, AMYLASE in the last 168 hours. No results for input(s): AMMONIA in the last 168 hours. Coagulation Profile: No results for input(s): INR, PROTIME in the last 168 hours. Cardiac Enzymes:  Recent Labs Lab 08/20/16 1659  TROPONINI 0.12*   BNP (last 3 results) No results for input(s): PROBNP in the last 8760 hours. HbA1C: No results for input(s): HGBA1C in the last 72 hours. CBG: No results for input(s): GLUCAP in the last 168 hours. Lipid Profile: No results for input(s): CHOL, HDL, LDLCALC, TRIG, CHOLHDL, LDLDIRECT in the last 72 hours. Thyroid Function Tests: No results for input(s): TSH, T4TOTAL, FREET4, T3FREE, THYROIDAB in the last 72 hours. Anemia Panel: No results for input(s): VITAMINB12, FOLATE, FERRITIN, TIBC, IRON, RETICCTPCT in the last 72 hours. Sepsis Labs: No results for input(s): PROCALCITON, LATICACIDVEN in the last 168 hours.  Recent Results (from the past 240 hour(s))  MRSA  PCR Screening     Status: None   Collection Time: 08/20/16  9:00 PM  Result Value Ref Range Status   MRSA by PCR NEGATIVE NEGATIVE Final    Comment:        The GeneXpert MRSA Assay (FDA approved for NASAL specimens only), is one component of a comprehensive MRSA colonization surveillance program. It is not intended to diagnose MRSA infection nor to guide or monitor treatment for MRSA infections.        Radiology Studies: No results found.    Scheduled Meds: . amiodarone  400 mg Oral BID  . apixaban  2.5 mg Oral BID  . digoxin  0.0625 mg Oral Daily  . diltiazem  180 mg Oral Daily  . dorzolamide  1 drop Both Eyes BID  . mometasone-formoterol  2 puff Inhalation BID  . sodium chloride flush  3 mL Intravenous Q12H  . timolol  1 drop Both Eyes BID   Continuous Infusions:    LOS: 5 days    Time spent: 20 minutes   Nita Sells, MD [facility] [email or web address] Password [facility] 08/25/2016, 7:58 AM

## 2016-08-26 NOTE — Progress Notes (Signed)
Patient c/o SOB after walking to the bathroom and back to bed with daughter. She also stated that she felt dizzy when she got back in the bed.  Vitals taken: b/p 133/73, pulse 109, resp 26. Pt stated that she felt that she could not catch her breath. Oxygen placed 2L, sats came up to 98%. Patient stated that she was feeling better. Pt did not want oxygen at this time but was left at bedside in case she felt she needed it. This RN encouraged patient to take slow deep breaths. Will continue to monitor.

## 2016-08-26 NOTE — Progress Notes (Signed)
Patient continues to have SOB walking to the bathroom and back to the bed. Vitals 129/71, pulse 113, resp 32. Patient placed on oxygen and patient felt better after sitting for awhile. Will continue to monitor

## 2016-08-26 NOTE — Progress Notes (Signed)
[facility] Progress Note  Patient: Jodi Burns QQP:[id number]   PCP: Jerlyn Ly, MD DOB: Sep 25, 1925   DOA: 08/20/2016   DOS: 08/26/2016   Date of Service: the patient was seen and examined on 08/26/2016  Brief hospital course: Pt. with PMH of COPD, A. fib, HTN; admitted on 08/20/2016, with complaint of shortness of breath, was found to have A. fib with RVR as well as lung nodule. Currently further plan is planned cardioversion Tuesday.  Assessment and Plan: Afib with RVR, rate improved now  -CHASVASC = 4.  -Did not tolerate IV cardizem due to hypotension.  -Cardiology following --meds being adjusted by cardiology--added Amiodarone 400 bid 12/28 [see note dated 12/29] -Eliquis started 2.5 bid this admit -Echo 12/27 shows ef 60-65%, slighlty elevated PASP  Patient remains highly symptomatic and therefore plan is to cardiovert on Tuesday after TEE.  Lung mass/GOLD COPD stg II -CT chest revealed: right upper lobe lung mass, consistent with primary bronchogenic carcinoma as well as irregular left upper lobe opacity which could represent a synchronous primary bronchogenic carcinoma -Pulmonology consulted. Will need PET as outpatient and follow up with pulm in 2 weeks  -Continue Dulera 2 puffs twice a day, Atrovent nasal spray one to 2 when necessary allergy  Elevated troponin -Likely due to demand, trended down  COPD -Without acute exacerbation. Continue home inhaler.   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  Advance goals of care  discussion: Full code  Family Communication: family was present at bedside, at the time of interview. The pt provided permission to discuss medical plan with the family. Opportunity was given to ask question and all questions were answered satisfactorily.   Disposition:  Discharge to home. Expected discharge date: After cardioversion 08/29/2016  Consultants: Cardiology, EP Procedures: Echocardiogram  Antibiotics: Anti-infectives    None        Subjective: Continues to have shortness of breath and fatigue on exertion. No hypoxia  Objective: Physical Exam: Vitals:   08/26/16 0217 08/26/16 0524 08/26/16 0853 08/26/16 1334  BP: 133/73 129/71  (!) 116/49  Pulse: (!) 109 (!) 113  74  Resp: (!) 26 (!) 32  18  Temp:  97.7 F (36.5 C)  98.2 F (36.8 C)  TempSrc:  Oral  Oral  SpO2: 95% 95% 93% 98%  Weight:  55.3 kg (122 lb)    Height:        Intake/Output Summary (Last 24 hours) at 08/26/16 1733 Last data filed at 08/26/16 1230  Gross per 24 hour  Intake              480 ml  Output                0 ml  Net              480 ml   Filed Weights   08/24/16 0538 08/25/16 0543 08/26/16 0524  Weight: 54.8 kg (120 lb 12.8 oz) 52.2 kg (115 lb 1.6 oz) 55.3 kg (122 lb)    General: Alert, Awake and Oriented to Time, Place and Person. Appear in mild distress, affect appropriate Eyes: PERRL, Conjunctiva normal ENT: Oral Mucosa  clear moist. Neck: no JVD, no Abnormal Mass Or lumps Cardiovascular: S1 and S2 Present, no Murmur, Respiratory: Bilateral Air entry equal and Decreased, no use of accessory muscle, Clear to Auscultation, no Crackles, no wheezes Abdomen: Bowel Sound present, Soft and no tenderness Skin: no redness, no Rash, no induration Extremities: no Pedal edema, no calf tenderness Neurologic: Grossly no focal neuro deficit. Bilaterally Equal motor strength  Data Reviewed: CBC:  Recent Labs Lab 08/20/16 1237 08/21/16 0247 08/22/16 0455 08/25/16 0445  WBC 11.7*  10.6* 9.3 12.0*  HGB 16.4* 13.5 13.1 11.9*  HCT 49.9* 41.8 41.2 37.1  MCV 94.5 93.9 94.9 97.1  PLT 285 258 236 950   Basic Metabolic Panel:  Recent Labs Lab 08/20/16 1237 08/20/16 1414 08/21/16 0247 08/23/16 1046 08/24/16 0153 08/25/16 1157  NA 139  --  141  --  141 138  K 4.0  --  3.6  --  4.1 3.9  CL 103  --  108  --  106 104  CO2 29  --  26  --  27 25  GLUCOSE 129*  --  108*  --  113* 105*  BUN 12  --  10  --  8 8  CREATININE 0.65  --  0.64  --  0.63 0.57  CALCIUM 9.4  --  8.4*  --  8.8* 8.9  MG  --  2.0  --  1.9  --   --   PHOS  --  2.8  --   --   --   --     Liver Function Tests: No results for input(s): AST, ALT, ALKPHOS, BILITOT, PROT, ALBUMIN in the last 168 hours. No results for input(s): LIPASE, AMYLASE in the last 168 hours. No results for input(s): AMMONIA in the last 168 hours. Coagulation Profile: No results for input(s): INR, PROTIME in the last 168 hours. Cardiac Enzymes:  Recent Labs Lab 08/20/16 1659  TROPONINI 0.12*   BNP (last 3 results) No results for input(s): PROBNP in the last 8760 hours.  CBG: No results for input(s): GLUCAP in the last 168 hours.  Studies: No results found.   Scheduled Meds: . apixaban  2.5 mg Oral BID  . diltiazem  180 mg Oral Daily  . dorzolamide  1 drop Both Eyes BID  . mometasone-formoterol  2 puff Inhalation BID  . sodium chloride flush  3 mL Intravenous Q12H  . timolol  1 drop Both Eyes BID   Continuous Infusions: PRN Meds: acetaminophen **OR** acetaminophen, ipratropium  Time spent: 30 minutes  Author: Berle Mull, MD [facility] Pager: [phone] 08/26/2016 5:33 PM  If 7PM-7AM, please contact night-coverage at [email or web address], password [facility]

## 2016-08-26 NOTE — Progress Notes (Addendum)
Patient Name: Jodi Burns Date of Encounter: 08/26/2016  Primary Cardiologist: Dr [facility] Problem List     Principal Problem:   DOE (dyspnea on exertion) Active Problems:   Allergic rhinitis due to pollen   PAF (paroxysmal atrial fibrillation) (HCC)   Essential hypertension   Atrial fibrillation with RVR (HCC)   Lung mass   Elevated troponin   COPD GOLD II     Subjective   Ambulated yesterday. Her major complaint is significant fatigue. She was able to walk to the restroom but was quite fatigued when she returned to the bed.   Inpatient Medications    Scheduled Meds: . amiodarone  400 mg Oral BID  . apixaban  2.5 mg Oral BID  . diltiazem  180 mg Oral Daily  . dorzolamide  1 drop Both Eyes BID  . mometasone-formoterol  2 puff Inhalation BID  . sodium chloride flush  3 mL Intravenous Q12H  . timolol  1 drop Both Eyes BID   Continuous Infusions:  PRN Meds: acetaminophen **OR** acetaminophen, ipratropium   Vital Signs    Vitals:   08/25/16 2118 08/26/16 0217 08/26/16 0524 08/26/16 0853  BP: 118/78 133/73 129/71   Pulse: (!) 115 (!) 109 (!) 113   Resp: 18 (!) 26 (!) 32   Temp: 98.8 F (37.1 C)  97.7 F (36.5 C)   TempSrc: Oral  Oral   SpO2: 94% 95% 95% 93%  Weight:   122 lb (55.3 kg)   Height:        Intake/Output Summary (Last 24 hours) at 08/26/16 0921 Last data filed at 08/26/16 0730  Gross per 24 hour  Intake              360 ml  Output                0 ml  Net              360 ml   Filed Weights   08/24/16 0538 08/25/16 0543 08/26/16 0524  Weight: 120 lb 12.8 oz (54.8 kg) 115 lb 1.6 oz (52.2 kg) 122 lb (55.3 kg)    Physical Exam    GEN: Well nourished, frail, in no acute distress.  HEENT: Grossly normal.  Neck: Supple Cardiac: tachycardic, regular Respiratory:  The lungs are clear. No wheezing, rales, or other complaints. GI: Soft, nontender, nondistended. MS: no deformity or atrophy. Skin: warm and dry, no  rash. Neuro:  Strength and sensation are intact. Psych: AAOx3.  Normal affect.  Labs    CBC  Recent Labs  08/25/16 0445  WBC 12.0*  HGB 11.9*  HCT 37.1  MCV 97.1  PLT 681   Basic Metabolic Panel  Recent Labs  08/23/16 1046 08/24/16 0153 08/25/16 1157  NA  --  141 138  K  --  4.1 3.9  CL  --  106 104  CO2  --  27 25  GLUCOSE  --  113* 105*  BUN  --  8 8  CREATININE  --  0.63 0.57  CALCIUM  --  8.8* 8.9  MG 1.9  --   --    Cardiac Enzymes No results for input(s): CKTOTAL, CKMB, CKMBINDEX, TROPONINI in the last 72 hours. Thyroid Function Tests No results for input(s): TSH, T4TOTAL, T3FREE, THYROIDAB in the last 72 hours.  Invalid input(s): FREET3  Telemetry    Atrial fibrillation With resting heart rate still greater than 105 bpm.   ECHOCARDIOGRAM: 08/23/16 Study Conclusions  -  Left ventricle: The cavity size was normal. There was mild   concentric hypertrophy. Systolic function was vigorous. The   estimated ejection fraction was in the range of 65% to 70%. Wall   motion was normal; there were no regional wall motion   abnormalities. The study was not technically sufficient to allow   evaluation of LV diastolic dysfunction due to atrial   fibrillation. - Aortic valve: Trileaflet; normal thickness leaflets. There was no   regurgitation. - Aortic root: The aortic root was normal in size. - Mitral valve: Calcified annulus. Mildly thickened leaflets .   There was mild regurgitation. - Left atrium: The atrium was normal in size. - Right ventricle: The cavity size was normal. Wall thickness was   normal. Systolic function was normal. - Right atrium: The atrium was normal in size. - Tricuspid valve: There was mild regurgitation. - Pulmonary arteries: Systolic pressure was mildly increased. PA   peak pressure: 32 mm Hg (S). - Inferior vena cava: The vessel was normal in size. - Pericardium, extracardiac: There was no pericardial effusion.  Patient Profile      82 year old female admitted with atrial fibrillation and also found to have lung mass.  Assessment & Plan    1. Persistent atrial flutter -Duration is unknown. Unfortunately she continues to feel quite fatigued. She has better rate control in atrial flutter. Amiodarone was added orally yesterday.    As she is at a high risk of stroke, Anisa Leanos hold amiodarone until TEE/CV is performed.  Continue diltiazem.  Continue Eliquis for CHADS2VASc of 4  Plan for TEE/CV on Tuesday      2. Probable lung cancer - further workup per pulmonary as OP.  3. Hypertension -blood pressure controlled.   4. Acute on chronic diastolic heart failure - without evidence of volume overload.   Signed, Londin Antone Meredith Leeds, MD  08/26/2016, 9:21 AM

## 2016-08-26 NOTE — Progress Notes (Signed)
Pt ambulated 100 ft with Denorris Reust. Pt oxygen level remained >91% for course of walk. HR 116 during walk. Pt tolerated fair - described feeling weak.  Fritz Pickerel, RN

## 2016-08-27 NOTE — Progress Notes (Signed)
PROGRESS NOTE    Jodi Burns  GEX:[id number] DOB: 10-29-1925 DOA: 08/20/2016 PCP: Jerlyn Ly, MD    Brief Narrative:  Pt. with PMH of COPD, A. fib, HTN; admitted on 08/20/2016, with complaint of shortness of breath, was found to have A. fib with RVR as well as lung nodule. Currently further plan is planned cardioversion Tuesday.   Assessment & Plan:   Principal Problem:   DOE (dyspnea on exertion) Active Problems:   Allergic rhinitis due to pollen   PAF (paroxysmal atrial fibrillation) ([place])   Essential hypertension   Atrial fibrillation with RVR (HCC)   Lung mass   Elevated troponin   COPD GOLD II   Afib with RVR, rate improved now  -CHASVASC = 4.  -Did not tolerate IV cardizem due to hypotension.  -Cardiology following - meds adjusted by cardiology--added Amiodarone 400 bid 12/28 [see note dated 12/29] -Eliquis started 2.5 bid this admit -Echo 12/27 shows ef 60-65%, slighlty elevated PASP  Patient remains highly symptomatic and therefore plan is to cardiovert on Tuesday after TEE - patient and husband asked cardiologist numerous questions about anticoagulation post TEE and possible cardioversion  Lung mass/GOLD COPD stg II -CT chest revealed: right upper lobe lung mass, consistent with primary bronchogenic carcinoma as well as irregular left upper lobe opacity which could represent a synchronous primary bronchogenic carcinoma -Pulmonology consulted. Will need PET as outpatient and follow up with pulm in 2 weeks  -Continue Dulera 2 puffs twice a day, Atrovent nasal spray one to 2 when necessary for allergy - Family asked many questions today about why PET scan cannot be performed while in the hospital  Elevated troponin -Likely due to demand, trended down  COPD -Without acute exacerbation. Continue home inhaler.   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 Code Status: Full code Family Communication: family was present at bedside, at the time of interview. The pt provided permission to discuss medical plan with the family. Opportunity was given to ask question and all questions were answered satisfactorily.    Disposition Plan: discharge after TEE and possible cardioversion- will need to follow up on PT recommendations    Consultants:   Cardiology  EP  Procedures:   Echocardiogram  Antimicrobials:   None    Subjective: Patient in good spirits this morning- it is [birth year] [age] today.  Her daughter and husband are bedside.  Patient reports that she feels well and has eaten and ambulated to and from bathroom.  Says she is still weak.  Seen with cardiologist.  Objective: Vitals:   08/26/16 1957 08/26/16 2127 08/27/16 0256 08/27/16 0507  BP: 128/63   137/67  Pulse: (!) 109   (!) 113  Resp: 18   15  Temp: 97.7 F (36.5 C)   97.4 F (36.3 C)  TempSrc: Oral   Oral  SpO2: 96% 94% 100% 99%  Weight:    56.7 kg (125 lb)  Height:        Intake/Output Summary (Last 24 hours) at 08/27/16 1012 Last data filed at 08/26/16 1630  Gross  per 24 hour  Intake              380 ml  Output                0 ml  Net              380 ml   Filed Weights   08/25/16 0543 08/26/16 0524 08/27/16 0507  Weight: 52.2 kg (115 lb 1.6 oz) 55.3 kg (122 lb) 56.7 kg (125 lb)    Examination:  General exam: Appears calm and comfortable  Respiratory system: Clear to auscultation. Respiratory effort normal. Cardiovascular system: S1 &  S2 heard, tachycardic. No JVD, murmurs, rubs, gallops or clicks. No pedal edema. Gastrointestinal system: Abdomen is nondistended, soft and nontender. No organomegaly or masses felt. Normal bowel sounds heard. Central nervous system: Alert and oriented. No focal neurological deficits. Extremities: no cyanosis clubbing or edema noted. Skin: No rashes, lesions or ulcers Psychiatry: Judgement and insight appear normal. Mood & affect appropriate.     Data Reviewed: I have personally reviewed following labs and imaging studies  CBC:  Recent Labs Lab 08/20/16 1237 08/21/16 0247 08/22/16 0455 08/25/16 0445  WBC 11.7* 10.6* 9.3 12.0*  HGB 16.4* 13.5 13.1 11.9*  HCT 49.9* 41.8 41.2 37.1  MCV 94.5 93.9 94.9 97.1  PLT 285 258 236 416   Basic Metabolic Panel:  Recent Labs Lab 08/20/16 1237 08/20/16 1414 08/21/16 0247 08/23/16 1046 08/24/16 0153 08/25/16 1157  NA 139  --  141  --  141 138  K 4.0  --  3.6  --  4.1 3.9  CL 103  --  108  --  106 104  CO2 29  --  26  --  27 25  GLUCOSE 129*  --  108*  --  113* 105*  BUN 12  --  10  --  8 8  CREATININE 0.65  --  0.64  --  0.63 0.57  CALCIUM 9.4  --  8.4*  --  8.8* 8.9  MG  --  2.0  --  1.9  --   --   PHOS  --  2.8  --   --   --   --    GFR: Estimated Creatinine Clearance: 38.4 mL/min (by C-G formula based on SCr of 0.57 mg/dL). Liver Function Tests: No results for input(s): AST, ALT, ALKPHOS, BILITOT, PROT, ALBUMIN in the last 168 hours. No results for input(s): LIPASE, AMYLASE in the last 168 hours. No results for input(s): AMMONIA in the last 168 hours. Coagulation Profile: No results for input(s): INR, PROTIME in the last 168 hours. Cardiac Enzymes:  Recent Labs Lab 08/20/16 1659  TROPONINI 0.12*   BNP (last 3 results) No results for input(s): PROBNP in the last 8760 hours. HbA1C: No results for input(s): HGBA1C in the last 72 hours. CBG: No results for input(s): GLUCAP in the last 168 hours. Lipid Profile: No  results for input(s): CHOL, HDL, LDLCALC, TRIG, CHOLHDL, LDLDIRECT in the last 72 hours. Thyroid Function Tests: No results for input(s): TSH, T4TOTAL, FREET4, T3FREE, THYROIDAB in the last 72 hours. Anemia Panel: No results for input(s): VITAMINB12, FOLATE, FERRITIN, TIBC, IRON, RETICCTPCT in the last 72 hours. Sepsis Labs: No results for input(s): PROCALCITON, LATICACIDVEN in the last 168 hours.  Recent Results (from the past 240 hour(s))  MRSA PCR Screening     Status: None   Collection Time: 08/20/16  9:00 PM  Result Value Ref Range Status   MRSA  by PCR NEGATIVE NEGATIVE Final    Comment:        The GeneXpert MRSA Assay (FDA approved for NASAL specimens only), is one component of a comprehensive MRSA colonization surveillance program. It is not intended to diagnose MRSA infection nor to guide or monitor treatment for MRSA infections.          Radiology Studies: No results found.      Scheduled Meds: . apixaban  2.5 mg Oral BID  . diltiazem  180 mg Oral Daily  . dorzolamide  1 drop Both Eyes BID  . mometasone-formoterol  2 puff Inhalation BID  . sodium chloride flush  3 mL Intravenous Q12H  . timolol  1 drop Both Eyes BID   Continuous Infusions:   LOS: 7 days    Time spent: 30 minutes    Loretha Stapler, MD [facility] Pager [phone]  If 7PM-7AM, please contact night-coverage [email or web address] Password [email or web address] 08/27/2016, 10:12 AM

## 2016-08-27 NOTE — Progress Notes (Signed)
Patient Name: Jodi Burns Date of Encounter: 08/27/2016  Primary Cardiologist: Dr [facility] Problem List     Principal Problem:   DOE (dyspnea on exertion) Active Problems:   Allergic rhinitis due to pollen   PAF (paroxysmal atrial fibrillation) (HCC)   Essential hypertension   Atrial fibrillation with RVR (HCC)   Lung mass   Elevated troponin   COPD GOLD II     Subjective   Continues to have fatigue with ambulation. No chest pain or palpitations. Not been out of bed very much.   Inpatient Medications    Scheduled Meds: . apixaban  2.5 mg Oral BID  . diltiazem  180 mg Oral Daily  . dorzolamide  1 drop Both Eyes BID  . mometasone-formoterol  2 puff Inhalation BID  . sodium chloride flush  3 mL Intravenous Q12H  . timolol  1 drop Both Eyes BID   Continuous Infusions:  PRN Meds: acetaminophen **OR** acetaminophen, ipratropium   Vital Signs    Vitals:   08/26/16 1957 08/26/16 2127 08/27/16 0256 08/27/16 0507  BP: 128/63   137/67  Pulse: (!) 109   (!) 113  Resp: 18   15  Temp: 97.7 F (36.5 C)   97.4 F (36.3 C)  TempSrc: Oral   Oral  SpO2: 96% 94% 100% 99%  Weight:    125 lb (56.7 kg)  Height:        Intake/Output Summary (Last 24 hours) at 08/27/16 0928 Last data filed at 08/26/16 1630  Gross per 24 hour  Intake              380 ml  Output                0 ml  Net              380 ml   Filed Weights   08/25/16 0543 08/26/16 0524 08/27/16 0507  Weight: 115 lb 1.6 oz (52.2 kg) 122 lb (55.3 kg) 125 lb (56.7 kg)    Physical Exam    GEN: Well nourished, frail, in no acute distress.  HEENT: Grossly normal.  Neck: Supple Cardiac: tachycardic, regular Respiratory:  The lungs are clear. No wheezing, rales, or other complaints. GI: Soft, nontender, nondistended. MS: no deformity or atrophy. Skin: warm and dry, no rash. Neuro:  Strength and sensation are intact. Psych: AAOx3.  Normal affect.  Labs    CBC  Recent Labs   08/25/16 0445  WBC 12.0*  HGB 11.9*  HCT 37.1  MCV 97.1  PLT 932   Basic Metabolic Panel  Recent Labs  08/25/16 1157  NA 138  K 3.9  CL 104  CO2 25  GLUCOSE 105*  BUN 8  CREATININE 0.57  CALCIUM 8.9   Cardiac Enzymes No results for input(s): CKTOTAL, CKMB, CKMBINDEX, TROPONINI in the last 72 hours. Thyroid Function Tests No results for input(s): TSH, T4TOTAL, T3FREE, THYROIDAB in the last 72 hours.  Invalid input(s): FREET3  Telemetry    Atrial fibrillation With resting heart rate still greater than 105 bpm.   ECHOCARDIOGRAM: 08/23/16 Study Conclusions  - Left ventricle: The cavity size was normal. There was mild   concentric hypertrophy. Systolic function was vigorous. The   estimated ejection fraction was in the range of 65% to 70%. Wall   motion was normal; there were no regional wall motion   abnormalities. The study was not technically sufficient to allow   evaluation of LV diastolic dysfunction due to  atrial   fibrillation. - Aortic valve: Trileaflet; normal thickness leaflets. There was no   regurgitation. - Aortic root: The aortic root was normal in size. - Mitral valve: Calcified annulus. Mildly thickened leaflets .   There was mild regurgitation. - Left atrium: The atrium was normal in size. - Right ventricle: The cavity size was normal. Wall thickness was   normal. Systolic function was normal. - Right atrium: The atrium was normal in size. - Tricuspid valve: There was mild regurgitation. - Pulmonary arteries: Systolic pressure was mildly increased. PA   peak pressure: 32 mm Hg (S). - Inferior vena cava: The vessel was normal in size. - Pericardium, extracardiac: There was no pericardial effusion.  Patient Profile     82 year old female admitted with atrial fibrillation and also found to have lung mass.  Assessment & Plan    1. Persistent atrial flutter -Duration is unknown. Unfortunately she continues to feel quite fatigued. She has better  rate control in atrial flutter. Amiodarone was added orally yesterday.    As she is at a high risk of stroke, Leandro Berkowitz hold amiodarone until TEE/CV is performed.  Continue diltiazem.  Continue Eliquis for CHADS2VASc of 4  Plan for TEE/CV on Tuesday  Start amiodarone post TEE and cardioversion.    2. Probable lung cancer - further workup per pulmonary as OP. PET scan planned  3. Hypertension -blood pressure controlled.   4. Acute on chronic diastolic heart failure - without evidence of volume overload.   Signed, Nawaf Strange Meredith Leeds, MD  08/27/2016, 9:28 AM

## 2016-08-28 MED ORDER — SODIUM CHLORIDE 0.9 % IV SOLN
INTRAVENOUS | Status: DC
  Administered 2016-08-28: 22:00:00 via INTRAVENOUS

## 2016-08-28 NOTE — Progress Notes (Signed)
Patient Name: Jodi Burns Date of Encounter: 08/28/2016  Primary Cardiologist: Dr [facility] Problem List     Principal Problem:   DOE (dyspnea on exertion) Active Problems:   Allergic rhinitis due to pollen   PAF (paroxysmal atrial fibrillation) ([place])   Essential hypertension   Atrial fibrillation with RVR (HCC)   Lung mass   Elevated troponin   COPD GOLD II     Subjective   Continues to have fatigue with ambulation, but able to walk much . No chest pain or palpitations. Has been walking to the bathroom and Branton Einstein get into the halls today.   Inpatient Medications    Scheduled Meds: . apixaban  2.5 mg Oral BID  . diltiazem  180 mg Oral Daily  . dorzolamide  1 drop Both Eyes BID  . mometasone-formoterol  2 puff Inhalation BID  . sodium chloride flush  3 mL Intravenous Q12H  . timolol  1 drop Both Eyes BID   Continuous Infusions:  PRN Meds: acetaminophen **OR** acetaminophen, ipratropium   Vital Signs    Vitals:   08/27/16 0507 08/27/16 1357 08/27/16 2056 08/28/16 0450  BP: 137/67 115/67 126/67 127/68  Pulse: (!) 113 77 (!) 108 80  Resp: '15 18 18 18  '$ Temp: 97.4 F (36.3 C) 98.2 F (36.8 C) 98.1 F (36.7 C) 97.5 F (36.4 C)  TempSrc: Oral Oral Oral Axillary  SpO2: 99% 96% 97% 94%  Weight: 125 lb (56.7 kg)   125 lb 10.6 oz (57 kg)  Height:        Intake/Output Summary (Last 24 hours) at 08/28/16 0829 Last data filed at 08/28/16 0400  Gross per 24 hour  Intake              240 ml  Output                0 ml  Net              240 ml   Filed Weights   08/26/16 0524 08/27/16 0507 08/28/16 0450  Weight: 122 lb (55.3 kg) 125 lb (56.7 kg) 125 lb 10.6 oz (57 kg)    Physical Exam    GEN: Well nourished, frail, in no acute distress.  HEENT: Grossly normal.  Neck: Supple Cardiac: tachycardic, regular Respiratory:  The lungs are clear. No wheezing, rales, or other complaints. GI: Soft, nontender, nondistended. MS: no deformity or  atrophy. Skin: warm and dry, no rash. Neuro:  Strength and sensation are intact. Psych: AAOx3.  Normal affect.  Labs    CBC No results for input(s): WBC, NEUTROABS, HGB, HCT, MCV, PLT in the last 72 hours. Basic Metabolic Panel  Recent Labs  08/25/16 1157  NA 138  K 3.9  CL 104  CO2 25  GLUCOSE 105*  BUN 8  CREATININE 0.57  CALCIUM 8.9   Cardiac Enzymes No results for input(s): CKTOTAL, CKMB, CKMBINDEX, TROPONINI in the last 72 hours. Thyroid Function Tests No results for input(s): TSH, T4TOTAL, T3FREE, THYROIDAB in the last 72 hours.  Invalid input(s): FREET3  Telemetry    Atrial fibrillation With resting heart rate still greater than 105 bpm.   ECHOCARDIOGRAM: 08/23/16 Study Conclusions  - Left ventricle: The cavity size was normal. There was mild   concentric hypertrophy. Systolic function was vigorous. The   estimated ejection fraction was in the range of 65% to 70%. Wall   motion was normal; there were no regional wall motion   abnormalities. The study  was not technically sufficient to allow   evaluation of LV diastolic dysfunction due to atrial   fibrillation. - Aortic valve: Trileaflet; normal thickness leaflets. There was no   regurgitation. - Aortic root: The aortic root was normal in size. - Mitral valve: Calcified annulus. Mildly thickened leaflets .   There was mild regurgitation. - Left atrium: The atrium was normal in size. - Right ventricle: The cavity size was normal. Wall thickness was   normal. Systolic function was normal. - Right atrium: The atrium was normal in size. - Tricuspid valve: There was mild regurgitation. - Pulmonary arteries: Systolic pressure was mildly increased. PA   peak pressure: 32 mm Hg (S). - Inferior vena cava: The vessel was normal in size. - Pericardium, extracardiac: There was no pericardial effusion.  Patient Profile     83 year old female admitted with atrial fibrillation and also found to have lung  mass.  Assessment & Plan    1. Persistent atrial flutter -Duration is unknown. Unfortunately she continues to feel quite fatigued. She has better rate control in atrial flutter. Amiodarone was added orally yesterday.    High stroke risk, hold amiodarone until after cardioversion  Continue diltiazem.  Continue Eliquis for CHADS2VASc of 4  Plan for TEE/CV on Tuesday  2. Probable lung cancer - further workup per pulmonary as OP. PET scan planned  3. Hypertension -blood pressure controlled.   4. Acute on chronic diastolic heart failure - without evidence of volume overload.   Signed, Cambri Plourde Meredith Leeds, MD  08/28/2016, 8:29 AM

## 2016-08-28 NOTE — Progress Notes (Signed)
PROGRESS NOTE    Jodi Burns  UXN:[id number] DOB: 1925/11/23 DOA: 08/20/2016 PCP: Jerlyn Ly, MD    Brief Narrative:  Pt. with PMH of COPD, A. fib, HTN; admitted on 08/20/2016, with complaint of shortness of breath, was found to have A. fib with RVR as well as lung nodule. Currently further plan is planned cardioversion Tuesday 08/29/16 after TEE if no clot found.   Assessment & Plan:   Principal Problem:   DOE (dyspnea on exertion) Active Problems:   Allergic rhinitis due to pollen   PAF (paroxysmal atrial fibrillation) ([place])   Essential hypertension   Atrial fibrillation with RVR (HCC)   Lung mass   Elevated troponin   COPD GOLD II   Afib with RVR, rate improved now  -CHASVASC = 4.  -Did not tolerate IV cardizem due to hypotension.  -Cardiology following - meds adjusted by cardiology--added Amiodarone 400 bid 12/28 [see note dated 12/29] -Eliquis started 2.5 bid this admit -Echo 12/27 shows ef 60-65%, slighlty elevated PASP  - Patient remains symptomatic and therefore plan is to cardiovert on Tuesday after TEE - did encourage ambulation as tolerated   Lung mass/GOLD COPD stg II -CT chest revealed: right upper lobe lung mass, consistent with primary bronchogenic carcinoma as well as irregular left upper lobe opacity which could represent a synchronous primary bronchogenic carcinoma -Pulmonology consulted. Will need PET as outpatient and follow up with pulm in 2 weeks  -Continue Dulera 2 puffs twice a day, Atrovent nasal spray one to 2 when necessary for allergy  Elevated troponin -Likely due to demand, trended down  COPD -Without acute exacerbation. Continue home inhaler.   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 -At baseline does require some assistance -We will ask therapy to evaluate the patient in consult and determine whether the patient will benefit from skilled care placement vs Home with [facility]  Adult FTT -Patient  has potential malignancy -significant decline from prior shoulder surgeries after deep anesthesia per her husband - awaiting PT recommendations as patient was seen prior during hospitalization  Bowel regimen: last BM 08/24/2016 Diet: Cardiac diet  DVT Prophylaxis:on therapeutic anticoagulation Code Status: Full code Family Communication: family was present at bedside, at the time of interview. The pt provided permission to discuss medical plan with the family. Opportunity was given to ask question and all questions were answered satisfactorily.    Disposition Plan: discharge after TEE and possible cardioversion- will need to follow up on PT recommendations    Consultants:   Cardiology  EP  Procedures:   Echocardiogram  Antimicrobials:   None    Subjective: Seen when sitting up eating breakfast.  Says she feels well this morning but does get fatigued walking.  Has been able to ambulate to and from the bathroom but this is tiring.  She has not made it much outside her room.  Hopeful to go home soon.  No acute overnight events noted.  Objective: Vitals:   08/27/16 0507 08/27/16 1357 08/27/16 2056 08/28/16 0450  BP: 137/67 115/67 126/67 127/68  Pulse: (!) 113 77 (!) 108 80  Resp: '15 18 18 18  '$ Temp: 97.4 F (36.3 C) 98.2 F (36.8 C) 98.1 F (36.7 C) 97.5 F (36.4 C)  TempSrc: Oral Oral Oral Axillary  SpO2: 99% 96% 97% 94%  Weight: 56.7 kg (125 lb)   57 kg (125 lb 10.6 oz)  Height:        Intake/Output Summary (Last 24 hours) at 08/28/16 0848 Last data  filed at 08/28/16 0400  Gross per 24 hour  Intake              240 ml  Output                0 ml  Net              240 ml   Filed Weights   08/26/16 0524 08/27/16 0507 08/28/16 0450  Weight: 55.3 kg (122 lb) 56.7 kg (125 lb) 57 kg (125 lb 10.6 oz)    Examination:  General exam: Appears calm and comfortable, frail elderly woman Respiratory system: Clear to auscultation. Respiratory effort  normal. Cardiovascular system: S1 & S2 heard, tachycardic. No JVD, murmurs, rubs, gallops or clicks. No pedal edema. Gastrointestinal system: Abdomen is nondistended, soft and nontender. No organomegaly or masses felt. Normal bowel sounds heard. Central nervous system: Alert and oriented. No focal neurological deficits. Extremities: no cyanosis clubbing or edema noted. Skin: No rashes, lesions or ulcers Psychiatry: Judgement and insight appear normal. Mood & affect appropriate.     Data Reviewed: I have personally reviewed following labs and imaging studies  CBC:  Recent Labs Lab 08/22/16 0455 08/25/16 0445  WBC 9.3 12.0*  HGB 13.1 11.9*  HCT 41.2 37.1  MCV 94.9 97.1  PLT 236 025   Basic Metabolic Panel:  Recent Labs Lab 08/23/16 1046 08/24/16 0153 08/25/16 1157  NA  --  141 138  K  --  4.1 3.9  CL  --  106 104  CO2  --  27 25  GLUCOSE  --  113* 105*  BUN  --  8 8  CREATININE  --  0.63 0.57  CALCIUM  --  8.8* 8.9  MG 1.9  --   --    GFR: Estimated Creatinine Clearance: 38.4 mL/min (by C-G formula based on SCr of 0.57 mg/dL). Liver Function Tests: No results for input(s): AST, ALT, ALKPHOS, BILITOT, PROT, ALBUMIN in the last 168 hours. No results for input(s): LIPASE, AMYLASE in the last 168 hours. No results for input(s): AMMONIA in the last 168 hours. Coagulation Profile: No results for input(s): INR, PROTIME in the last 168 hours. Cardiac Enzymes: No results for input(s): CKTOTAL, CKMB, CKMBINDEX, TROPONINI in the last 168 hours. BNP (last 3 results) No results for input(s): PROBNP in the last 8760 hours. HbA1C: No results for input(s): HGBA1C in the last 72 hours. CBG: No results for input(s): GLUCAP in the last 168 hours. Lipid Profile: No results for input(s): CHOL, HDL, LDLCALC, TRIG, CHOLHDL, LDLDIRECT in the last 72 hours. Thyroid Function Tests: No results for input(s): TSH, T4TOTAL, FREET4, T3FREE, THYROIDAB in the last 72 hours. Anemia  Panel: No results for input(s): VITAMINB12, FOLATE, FERRITIN, TIBC, IRON, RETICCTPCT in the last 72 hours. Sepsis Labs: No results for input(s): PROCALCITON, LATICACIDVEN in the last 168 hours.  Recent Results (from the past 240 hour(s))  MRSA PCR Screening     Status: None   Collection Time: 08/20/16  9:00 PM  Result Value Ref Range Status   MRSA by PCR NEGATIVE NEGATIVE Final    Comment:        The GeneXpert MRSA Assay (FDA approved for NASAL specimens only), is one component of a comprehensive MRSA colonization surveillance program. It is not intended to diagnose MRSA infection nor to guide or monitor treatment for MRSA infections.          Radiology Studies: No results found.      Scheduled Meds: .  apixaban  2.5 mg Oral BID  . diltiazem  180 mg Oral Daily  . dorzolamide  1 drop Both Eyes BID  . mometasone-formoterol  2 puff Inhalation BID  . sodium chloride flush  3 mL Intravenous Q12H  . timolol  1 drop Both Eyes BID   Continuous Infusions:   LOS: 8 days    Time spent: 30 minutes    Loretha Stapler, MD [facility] Pager [phone]  If 7PM-7AM, please contact night-coverage [email or web address] Password [facility] 08/28/2016, 8:48 AM

## 2016-08-29 ENCOUNTER — Inpatient Hospital Stay (HOSPITAL_COMMUNITY): Payer: Medicare Other

## 2016-08-29 ENCOUNTER — Inpatient Hospital Stay (HOSPITAL_COMMUNITY): Payer: Medicare Other | Admitting: Anesthesiology

## 2016-08-29 ENCOUNTER — Encounter (HOSPITAL_COMMUNITY)
Admission: EM | Disposition: A | Discharge status: Home Health | Payer: Self-pay | Source: Home / Self Care | Attending: Family Medicine

## 2016-08-29 ENCOUNTER — Encounter (HOSPITAL_COMMUNITY): Payer: Self-pay | Admitting: *Deleted

## 2016-08-29 DIAGNOSIS — R2689 Other abnormalities of gait and mobility: Secondary | ICD-10-CM

## 2016-08-29 DIAGNOSIS — I484 Atypical atrial flutter: Secondary | ICD-10-CM

## 2016-08-29 DIAGNOSIS — J301 Allergic rhinitis due to pollen: Secondary | ICD-10-CM

## 2016-08-29 DIAGNOSIS — I361 Nonrheumatic tricuspid (valve) insufficiency: Secondary | ICD-10-CM

## 2016-08-29 HISTORY — PX: TEE WITHOUT CARDIOVERSION: SHX5443

## 2016-08-29 HISTORY — PX: CARDIOVERSION: SHX1299

## 2016-08-29 SURGERY — ECHOCARDIOGRAM, TRANSESOPHAGEAL
Anesthesia: General

## 2016-08-29 MED ORDER — LIDOCAINE 2% (20 MG/ML) 5 ML SYRINGE
INTRAMUSCULAR | Status: DC | PRN
  Administered 2016-08-29: 50 mg via INTRAVENOUS

## 2016-08-29 MED ORDER — PROPOFOL 500 MG/50ML IV EMUL
INTRAVENOUS | Status: DC | PRN
  Administered 2016-08-29: 50 ug/kg/min via INTRAVENOUS

## 2016-08-29 MED ORDER — AMIODARONE HCL 200 MG PO TABS
200.0000 mg | ORAL_TABLET | Freq: Two times a day (BID) | ORAL | Status: DC
  Administered 2016-08-29 – 2016-08-31 (×4): 200 mg via ORAL
  Filled 2016-08-29 (×4): qty 1

## 2016-08-29 MED ORDER — BUTAMBEN-TETRACAINE-BENZOCAINE 2-2-14 % EX AERO
INHALATION_SPRAY | CUTANEOUS | Status: DC | PRN
  Administered 2016-08-29: 2 via TOPICAL

## 2016-08-29 MED ORDER — AMIODARONE IV BOLUS ONLY 150 MG/100ML
150.0000 mg | Freq: Once | INTRAVENOUS | Status: AC
  Administered 2016-08-29: 150 mg via INTRAVENOUS
  Filled 2016-08-29: qty 100

## 2016-08-29 MED ORDER — PROPOFOL 10 MG/ML IV BOLUS
INTRAVENOUS | Status: DC | PRN
  Administered 2016-08-29 (×2): 20 mg via INTRAVENOUS

## 2016-08-29 NOTE — Progress Notes (Signed)
PROGRESS NOTE    Jodi Burns  VOP:[id number] DOB: 1926-07-07 DOA: 08/20/2016 PCP: Jerlyn Ly, MD    Brief Narrative:  Pt. with PMH of COPD, A. fib, HTN; admitted on 08/20/2016, with complaint of shortness of breath, was found to have A. fib with RVR as well as lung nodule. Currently further plan is planned cardioversion Tuesday 08/29/16 after TEE if no clot found.   Assessment & Plan:   Principal Problem:   DOE (dyspnea on exertion) Active Problems:   Allergic rhinitis due to pollen   PAF (paroxysmal atrial fibrillation) (Time)   Essential hypertension   Atrial fibrillation with RVR (HCC)   Lung mass   Elevated troponin   COPD GOLD II   Afib with RVR, rate improved now  -CHASVASC = 4.  -Did not tolerate IV cardizem due to hypotension.  -Cardiology following - meds adjusted by cardiology--added Amiodarone 400 bid 12/28 [see note dated 12/29] -Eliquis started 2.5 bid this admit -Echo 12/27 shows ef 60-65%, slighlty elevated PASP  - Patient remains symptomatic and therefore plan is to cardiovert on Tuesday after TEE - did encourage ambulation as tolerated - plan for TEE and cardioversion today and then IV amiodarone   Lung mass/GOLD COPD stg II -CT chest revealed: right upper lobe lung mass, consistent with primary bronchogenic carcinoma as well as irregular left upper lobe opacity which could represent a synchronous primary bronchogenic carcinoma -Pulmonology consulted. Will need PET as outpatient and follow up with pulm in 2 weeks  -Continue Dulera 2 puffs twice a day, Atrovent nasal spray one to 2 when necessary for allergy   COPD -Without acute exacerbation. Continue home inhaler.   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 The pt provided permission to discuss medical plan with the family. Opportunity was given to ask question and all questions were answered satisfactorily.    Disposition Plan: possibly discharge tomorrow if remains stable    Consultants:   Cardiology  EP  Case management  PT/OT  Procedures:   Echocardiogram  Antimicrobials:   None    Subjective: Patient anxious to get her procedure done today as she wants to eat.  Husband voices concern about her going home today and even tomorrow.  Husband is asking for assistance in [place] but also for possibly help housekeeping.  Objective: Vitals:   08/28/16 2036 08/29/16 0244 08/29/16 0603 08/29/16 1320  BP: 120/63  136/84 (!) 146/85  Pulse: (!) 129  (!) 130 (!) 131  Resp: 18  18   Temp: 98.2 F (36.8 C)  97.6 F (36.4 C)   TempSrc: Oral  Oral   SpO2: 97%  96% 96%  Weight:  55.1 kg (121 lb 6.4 oz)    Height:        Intake/Output Summary (Last 24 hours) at 08/29/16 1333 Last data filed at 08/29/16 0449  Gross per 24 hour  Intake              373 ml  Output              200 ml  Net              173 ml  Filed Weights   08/27/16 0507 08/28/16 0450 08/29/16 0244  Weight: 56.7 kg (125 lb) 57 kg (125 lb 10.6 oz) 55.1 kg (121 lb 6.4 oz)    Examination:  General exam: Appears calm and comfortable, frail elderly woman Respiratory system: Clear to auscultation. Respiratory effort normal. Cardiovascular system: S1 & S2 heard, tachycardic. No JVD, murmurs, rubs, gallops or clicks. No pedal edema. Gastrointestinal system: Abdomen is nondistended, soft and nontender. No organomegaly or masses felt. Normal bowel sounds heard. Central nervous system: Alert and oriented. No focal neurological deficits. Extremities: no cyanosis clubbing or edema noted. Skin: No rashes,  lesions or ulcers Psychiatry: Judgement and insight appear normal. Mood & affect appropriate.     Data Reviewed: I have personally reviewed following labs and imaging studies  CBC:  Recent Labs Lab 08/25/16 0445  WBC 12.0*  HGB 11.9*  HCT 37.1  MCV 97.1  PLT 161   Basic Metabolic Panel:  Recent Labs Lab 08/23/16 1046 08/24/16 0153 08/25/16 1157  NA  --  141 138  K  --  4.1 3.9  CL  --  106 104  CO2  --  27 25  GLUCOSE  --  113* 105*  BUN  --  8 8  CREATININE  --  0.63 0.57  CALCIUM  --  8.8* 8.9  MG 1.9  --   --    GFR: Estimated Creatinine Clearance: 38.4 mL/min (by C-G formula based on SCr of 0.57 mg/dL). Liver Function Tests: No results for input(s): AST, ALT, ALKPHOS, BILITOT, PROT, ALBUMIN in the last 168 hours. No results for input(s): LIPASE, AMYLASE in the last 168 hours. No results for input(s): AMMONIA in the last 168 hours. Coagulation Profile: No results for input(s): INR, PROTIME in the last 168 hours. Cardiac Enzymes: No results for input(s): CKTOTAL, CKMB, CKMBINDEX, TROPONINI in the last 168 hours. BNP (last 3 results) No results for input(s): PROBNP in the last 8760 hours. HbA1C: No results for input(s): HGBA1C in the last 72 hours. CBG: No results for input(s): GLUCAP in the last 168 hours. Lipid Profile: No results for input(s): CHOL, HDL, LDLCALC, TRIG, CHOLHDL, LDLDIRECT in the last 72 hours. Thyroid Function Tests: No results for input(s): TSH, T4TOTAL, FREET4, T3FREE, THYROIDAB in the last 72 hours. Anemia Panel: No results for input(s): VITAMINB12, FOLATE, FERRITIN, TIBC, IRON, RETICCTPCT in the last 72 hours. Sepsis Labs: No results for input(s): PROCALCITON, LATICACIDVEN in the last 168 hours.  Recent Results (from the past 240 hour(s))  MRSA PCR Screening     Status: None   Collection Time: 08/20/16  9:00 PM  Result Value Ref Range Status   MRSA by PCR NEGATIVE NEGATIVE Final    Comment:        The GeneXpert MRSA Assay  (FDA approved for NASAL specimens only), is one component of a comprehensive MRSA colonization surveillance program. It is not intended to diagnose MRSA infection nor to guide or monitor treatment for MRSA infections.          Radiology Studies: No results found.      Scheduled Meds: . [MAR Hold] apixaban  2.5 mg Oral BID  . [MAR Hold] diltiazem  180 mg Oral Daily  . [MAR Hold] dorzolamide  1 drop Both Eyes BID  . [MAR Hold] mometasone-formoterol  2 puff Inhalation BID  . [MAR Hold] sodium chloride flush  3 mL Intravenous Q12H  . [MAR Hold] timolol  1 drop Both Eyes BID   Continuous Infusions: . sodium chloride  20 mL/hr at 08/28/16 2210     LOS: 9 days    Time spent: 30 minutes    Loretha Stapler, MD [facility] Pager [phone]  If 7PM-7AM, please contact night-coverage [email or web address] Password [email or web address] 08/29/2016, 1:33 PM

## 2016-08-29 NOTE — Transfer of Care (Signed)
Immediate Anesthesia Transfer of Care Note  Patient: Jodi Burns  Procedure(s) Performed: Procedure(s): TRANSESOPHAGEAL ECHOCARDIOGRAM (TEE) (N/A) CARDIOVERSION (N/A)  Patient Location: PACU and Endoscopy Unit  Anesthesia Type:MAC  Level of Consciousness: awake, alert , oriented and patient cooperative  Airway & Oxygen Therapy: Patient Spontanous Breathing and Patient connected to nasal cannula oxygen  Post-op Assessment: Report given to RN, Post -op Vital signs reviewed and stable and Patient moving all extremities  Post vital signs: Reviewed and stable  Last Vitals:  Vitals:   08/29/16 1437 08/29/16 1442  BP:  126/65  Pulse: 99 94  Resp:  (!) 24  Temp:  36.4 C    Last Pain:  Vitals:   08/29/16 1442  TempSrc: Oral  PainSc:          Complications: No apparent anesthesia complications

## 2016-08-29 NOTE — Anesthesia Postprocedure Evaluation (Signed)
Anesthesia Post Note  Patient: DREANNA KYLLO  Procedure(s) Performed: Procedure(s) (LRB): TRANSESOPHAGEAL ECHOCARDIOGRAM (TEE) (N/A) CARDIOVERSION (N/A)  Patient location during evaluation: Endoscopy Anesthesia Type: General Level of consciousness: awake and alert Pain management: pain level controlled Vital Signs Assessment: post-procedure vital signs reviewed and stable Respiratory status: spontaneous breathing, nonlabored ventilation, respiratory function stable and patient connected to nasal cannula oxygen Cardiovascular status: blood pressure returned to baseline and stable Postop Assessment: no signs of nausea or vomiting Anesthetic complications: no       Last Vitals:  Vitals:   08/29/16 1442 08/29/16 1452  BP: 126/65 120/64  Pulse: 94 95  Resp: (!) 24 (!) 24  Temp: 36.4 C     Last Pain:  Vitals:   08/29/16 1442  TempSrc: Oral  PainSc:                  Effie Berkshire

## 2016-08-29 NOTE — Anesthesia Preprocedure Evaluation (Addendum)
Anesthesia Evaluation  Patient identified by MRN, date of birth, ID band Patient awake    Reviewed: Allergy & Precautions, NPO status , Patient's Chart, lab work & pertinent test results  Airway Mallampati: I  TM Distance: >3 FB Neck ROM: Full    Dental  (+) Teeth Intact, Dental Advisory Given   Pulmonary asthma , COPD, former smoker,    breath sounds clear to auscultation       Cardiovascular hypertension, Pt. on medications + DOE  + dysrhythmias Atrial Fibrillation  Rhythm:Regular Rate:Normal     Neuro/Psych negative neurological ROS  negative psych ROS   GI/Hepatic negative GI ROS, Neg liver ROS,   Endo/Other  negative endocrine ROS  Renal/GU negative Renal ROS  negative genitourinary   Musculoskeletal negative musculoskeletal ROS (+)   Abdominal   Peds negative pediatric ROS (+)  Hematology negative hematology ROS (+)   Anesthesia Other Findings   Reproductive/Obstetrics negative OB ROS                            07/2016 EKG: atrial fibrillation.  07/2016 Echo - Left ventricle: The cavity size was normal. There was mild   concentric hypertrophy. Systolic function was vigorous. The   estimated ejection fraction was in the range of 65% to 70%. Wall   motion was normal; there were no regional wall motion   abnormalities. The study was not technically sufficient to allow   evaluation of LV diastolic dysfunction due to atrial   fibrillation. - Aortic valve: Trileaflet; normal thickness leaflets. There was no   regurgitation. - Aortic root: The aortic root was normal in size. - Mitral valve: Calcified annulus. Mildly thickened leaflets .   There was mild regurgitation. - Left atrium: The atrium was normal in size. - Right ventricle: The cavity size was normal. Wall thickness was   normal. Systolic function was normal. - Right atrium: The atrium was normal in size. - Tricuspid  valve: There was mild regurgitation. - Pulmonary arteries: Systolic pressure was mildly increased. PA   peak pressure: 32 mm Hg (S). - Inferior vena cava: The vessel was normal in size. - Pericardium, extracardiac: There was no pericardial effusion.   Anesthesia Physical Anesthesia Plan  ASA: III  Anesthesia Plan: General   Post-op Pain Management:    Induction: Intravenous  Airway Management Planned: Nasal Cannula  Additional Equipment:   Intra-op Plan:   Post-operative Plan:   Informed Consent: I have reviewed the patients History and Physical, chart, labs and discussed the procedure including the risks, benefits and alternatives for the proposed anesthesia with the patient or authorized representative who has indicated his/her understanding and acceptance.     Plan Discussed with: CRNA  Anesthesia Plan Comments:        Anesthesia Quick Evaluation

## 2016-08-29 NOTE — H&P (View-Only) (Signed)
       Patient Name: Jodi Burns Date of Encounter: 08/29/2016    SUBJECTIVE: She feels well. Since amiodarone was discontinued, heart rate on average is increasing.  TELEMETRY:  Atrial flutter with variable AV block and heart rates up to 1:30 since discontinuation of amiodarone Vitals:   08/28/16 1942 08/28/16 2036 08/29/16 0244 08/29/16 0603  BP:  120/63  136/84  Pulse:  (!) 129  (!) 130  Resp:  18  18  Temp:  98.2 F (36.8 C)  97.6 F (36.4 C)  TempSrc:  Oral  Oral  SpO2: 96% 97%  96%  Weight:   121 lb 6.4 oz (55.1 kg)   Height:        Intake/Output Summary (Last 24 hours) at 08/29/16 0956 Last data filed at 08/29/16 0449  Gross per 24 hour  Intake              613 ml  Output              200 ml  Net              413 ml     Radiology/Studies:  No new data  Physical Exam: Blood pressure 136/84, pulse (!) 130, temperature 97.6 F (36.4 C), temperature source Oral, resp. rate 18, height '5\' 2"'$  (1.575 m), weight 121 lb 6.4 oz (55.1 kg), last menstrual period 08/29/1967, SpO2 96 %. Weight change: -4 lb 4.2 oz (-1.933 kg)  Wt Readings from Last 3 Encounters:  08/29/16 121 lb 6.4 oz (55.1 kg)  06/19/16 127 lb (57.6 kg)  01/20/16 128 lb (58.1 kg)    Lying flat in bed and in no extreme [place]. Cardiac exam reveals tachycardia without murmur. Chest is clear. Neck exam reveals no JVD.  ASSESSMENT:  1. Persistent atrial flutter with rapid ventricular response. Amiodarone was discontinued by Dr. Curt Bears over the weekend because of concern for embolic stroke if she pharmacologically converts to normal rhythm. The likelihood of conversion and stroke is low (probably 5% or less, but no data to support this-purely my clinical experience). My concern is rapid recurrence of atrial arrhythmia post-cardioversion. This was previously discussed with the family and accepted.  2. Chronic anticoagulation has been started.  3. Probable lung cancer, outpatient workup is planned  4.  Acute on chronic diastolic heart failure without evidence of volume overload   Plan:  1. Transesophageal echo guided electrical cardioversion today. 2. If no excessive bradycardia, give IV amiodarone bolus and resume oral therapy. 3. If all goes well, the patient maintains sinus rhythm, would hope for discharge 24 hours postprocedure.  Signed, Belva Crome III 08/29/2016, 9:56 AM

## 2016-08-29 NOTE — Progress Notes (Signed)
  Echocardiogram Echocardiogram Transesophageal has been performed.  Darlina Sicilian M 08/29/2016, 2:49 PM

## 2016-08-29 NOTE — Progress Notes (Signed)
OT Cancellation Note  Patient Details Name: Jodi Burns MRN: [id number] DOB: 09-Dec-1925   Cancelled Treatment:    Reason Eval/Treat Not Completed: Patient at procedure or test/ unavailable. Pt for cardioversion today, will re-attempt eval tomorrow as appropriate.  Almon Register [phone] 08/29/2016, 12:45 PM

## 2016-08-29 NOTE — Op Note (Signed)
INDICATIONS: atrial flutter  PROCEDURE:   Informed consent was obtained prior to the procedure. The risks, benefits and alternatives for the procedure were discussed and the patient comprehended these risks.  Risks include, but are not limited to, cough, sore throat, vomiting, nausea, somnolence, esophageal and stomach trauma or perforation, bleeding, low blood pressure, aspiration, pneumonia, infection, trauma to the teeth and death.    After a procedural time-out, the oropharynx was anesthetized with 20% benzocaine spray.   During this procedure the patient was administered iv Propofol by Anesthesiology, Dr. Smith Robert.  The transesophageal probe was inserted in the esophagus and stomach without difficulty and multiple views were obtained.  The patient was kept under observation until the patient left the procedure room.  The patient left the procedure room in stable condition.   Agitated microbubble saline contrast was not administered.  COMPLICATIONS:    There were no immediate complications.  FINDINGS:  No LA thrombus. Rhythm appears to be atrial tachycardia with 1:1 AV conduction, judging by the left atrial appendage contractions.  RECOMMENDATIONS:     Proceed with DCCV.  Time Spent Directly with the Patient:  30 minutes   Jodi Burns 08/29/2016, 2:19 PM

## 2016-08-29 NOTE — Progress Notes (Signed)
       Patient Name: Jodi Burns Date of Encounter: 08/29/2016    SUBJECTIVE: She feels well. Since amiodarone was discontinued, heart rate on average is increasing.  TELEMETRY:  Atrial flutter with variable AV block and heart rates up to 1:30 since discontinuation of amiodarone Vitals:   08/28/16 1942 08/28/16 2036 08/29/16 0244 08/29/16 0603  BP:  120/63  136/84  Pulse:  (!) 129  (!) 130  Resp:  18  18  Temp:  98.2 F (36.8 C)  97.6 F (36.4 C)  TempSrc:  Oral  Oral  SpO2: 96% 97%  96%  Weight:   121 lb 6.4 oz (55.1 kg)   Height:        Intake/Output Summary (Last 24 hours) at 08/29/16 0956 Last data filed at 08/29/16 0449  Gross per 24 hour  Intake              613 ml  Output              200 ml  Net              413 ml     Radiology/Studies:  No new data  Physical Exam: Blood pressure 136/84, pulse (!) 130, temperature 97.6 F (36.4 C), temperature source Oral, resp. rate 18, height '5\' 2"'$  (1.575 m), weight 121 lb 6.4 oz (55.1 kg), last menstrual period 08/29/1967, SpO2 96 %. Weight change: -4 lb 4.2 oz (-1.933 kg)  Wt Readings from Last 3 Encounters:  08/29/16 121 lb 6.4 oz (55.1 kg)  06/19/16 127 lb (57.6 kg)  01/20/16 128 lb (58.1 kg)    Lying flat in bed and in no extreme [place]. Cardiac exam reveals tachycardia without murmur. Chest is clear. Neck exam reveals no JVD.  ASSESSMENT:  1. Persistent atrial flutter with rapid ventricular response. Amiodarone was discontinued by Dr. Curt Bears over the weekend because of concern for embolic stroke if she pharmacologically converts to normal rhythm. The likelihood of conversion and stroke is low (probably 5% or less, but no data to support this-purely my clinical experience). My concern is rapid recurrence of atrial arrhythmia post-cardioversion. This was previously discussed with the family and accepted.  2. Chronic anticoagulation has been started.  3. Probable lung cancer, outpatient workup is planned  4.  Acute on chronic diastolic heart failure without evidence of volume overload   Plan:  1. Transesophageal echo guided electrical cardioversion today. 2. If no excessive bradycardia, give IV amiodarone bolus and resume oral therapy. 3. If all goes well, the patient maintains sinus rhythm, would hope for discharge 24 hours postprocedure.  Signed, Belva Crome III 08/29/2016, 9:56 AM

## 2016-08-29 NOTE — Progress Notes (Signed)
Pt returned from [place]. Pt alert and oriented. Family at bedside. Telemetry applied, CCMD notified. Call light within reach, will continue to monitor.   Fritz Pickerel, RN

## 2016-08-29 NOTE — Progress Notes (Signed)
PT Cancellation Note  Patient Details Name: [name] MRN: [id number] DOB: [birth year]   Cancelled Treatment:    Reason Eval/Treat Not Completed: Patient at procedure or test/unavailable. Pt with resting HR at 130s and leaving for cardioversion in 10 min. PT to return as able.   Jodi Burns M Dayvian Blixt 08/29/2016, 1:02 PM   Kittie Plater, PT, DPT Pager #: [phone] Office #: [phone]

## 2016-08-29 NOTE — Interval H&P Note (Signed)
History and Physical Interval Note:  08/29/2016 2:19 PM  Jodi Burns  has presented today for surgery, with the diagnosis of A Fib  The various methods of treatment have been discussed with the patient and family. After consideration of risks, benefits and other options for treatment, the patient has consented to  Procedure(s): TRANSESOPHAGEAL ECHOCARDIOGRAM (TEE) (N/A) CARDIOVERSION (N/A) as a surgical intervention .  The patient's history has been reviewed, patient examined, no change in status, stable for surgery.  I have reviewed the patient's chart and labs.  Questions were answered to the patient's satisfaction.     Clerence Gubser

## 2016-08-29 NOTE — Op Note (Signed)
Procedure: Electrical Cardioversion Indications:  Atrial Flutter  Procedure Details:  Consent: Risks of procedure as well as the alternatives and risks of each were explained to the (patient/caregiver).  Consent for procedure obtained.  Time Out: Verified patient identification, verified procedure, site/side was marked, verified correct patient position, special equipment/implants available, medications/allergies/relevent history reviewed, required imaging and test results available.  Performed  Patient placed on cardiac monitor, pulse oximetry, supplemental oxygen as necessary.  Sedation given: IV propofol, Dr. Smith Robert Pacer pads placed anterior and posterior chest.  Cardioverted 1 time(s).  Cardioversion with synchronized biphasic 120J shock.  Evaluation: Findings: Post procedure EKG shows: NSR Complications: None Patient did tolerate procedure well.  Time Spent Directly with the Patient:  30 minutes   Jodi Burns 08/29/2016, 2:36 PM

## 2016-08-29 NOTE — Care Management Important Message (Signed)
Important Message  Patient Details  Name: Jodi Burns MRN: [id number] Date of Birth: February 23, 1926   Medicare Important Message Given:  Yes    Nathen May 08/29/2016, 2:37 PM

## 2016-08-30 ENCOUNTER — Encounter (HOSPITAL_COMMUNITY): Payer: Self-pay | Admitting: Cardiovascular Disease

## 2016-08-30 DIAGNOSIS — J9601 Acute respiratory failure with hypoxia: Secondary | ICD-10-CM

## 2016-08-30 LAB — GLUCOSE, CAPILLARY: GLUCOSE-CAPILLARY: 92 mg/dL (ref 65–99)

## 2016-08-30 NOTE — Evaluation (Signed)
Occupational Therapy Evaluation Patient Details Name: Jodi Burns MRN: [id number] DOB: 1926-08-16 Today's Date: 08/30/2016    History of Present Illness 83 y.o. female pmh significant for afib and COPD, former smoker, who presented with sob. URI 3 wks ago. Gradually weak over last 2 days. Dizziness.    Clinical Impression   Pt reports she was mod I with ADL PTA. Currently pt overall min guard-min assist for ADL and functional mobility. Pt presenting with generalized weakness, deconditioning, poor standing balance, and fatigues quickly with activity impacting her independence and safety with ADL and functional mobility. SpO2=91% on RA following activity (RN aware). Recommending SNF for follow up to maximize independence and safety with ADL and functional mobility prior to return home. Pt would benefit from continued skilled OT to address established goals.    Follow Up Recommendations  SNF;Supervision/Assistance - 24 hour    Equipment Recommendations  Other (comment) (TBD at next venue)    Recommendations for Other Services       Precautions / Restrictions Precautions Precautions: Fall Restrictions Weight Bearing Restrictions: No      Mobility Bed Mobility Overal bed mobility: Needs Assistance Bed Mobility: Sit to Supine       Sit to supine: Supervision   General bed mobility comments: HOB flat without use of rails. Used bed rails to boost up in bed without physical assist.  Transfers Overall transfer level: Needs assistance Equipment used: Rolling walker (2 wheeled) Transfers: Sit to/from Stand Sit to Stand: Min guard         General transfer comment: Cues for hand placement.    Balance Overall balance assessment: Needs assistance Sitting-balance support: No upper extremity supported;Feet supported Sitting balance-Leahy Scale: Good     Standing balance support: During functional activity;Single extremity supported Standing balance-Leahy Scale:  Poor                              ADL Overall ADL's : Needs assistance/impaired Eating/Feeding: Set up;Sitting   Grooming: Min guard;Standing;Wash/dry hands   Upper Body Bathing: Min guard;Sitting   Lower Body Bathing: Minimal assistance;Sit to/from stand   Upper Body Dressing : Min guard;Sitting   Lower Body Dressing: Minimal assistance;Sit to/from stand Lower Body Dressing Details (indicate cue type and reason): Pt able to readjust socks in sitting Toilet Transfer: Minimal assistance;Ambulation;Regular Toilet;Grab bars;RW   Toileting- Clothing Manipulation and Hygiene: Supervision/safety;Sitting/lateral lean Toileting - Clothing Manipulation Details (indicate cue type and reason): for peri care only     Functional mobility during ADLs: Minimal assistance;Rolling walker General ADL Comments: Pt with SOB during functional mobility, SpO2=91% on RA. Educated pt on pursed lip breathing.     Vision     Perception     Praxis      Pertinent Vitals/Pain Pain Assessment: No/denies pain     Hand Dominance     Extremity/Trunk Assessment Upper Extremity Assessment Upper Extremity Assessment: Overall WFL for tasks assessed   Lower Extremity Assessment Lower Extremity Assessment: Defer to PT evaluation   Cervical / Trunk Assessment Cervical / Trunk Assessment: Kyphotic   Communication Communication Communication: HOH   Cognition Arousal/Alertness: Awake/alert Behavior During Therapy: WFL for tasks assessed/performed Overall Cognitive Status: Within Functional Limits for tasks assessed                     General Comments       Exercises       Shoulder Instructions  Home Living Family/patient expects to be discharged to:: Skilled nursing facility                                 Additional Comments: Pt and family report plan is for short term SNF prior to return home.      Prior Functioning/Environment Level of  Independence: Independent with assistive device(s)        Comments: Ambulated with cane.        OT Problem List: Decreased strength;Decreased activity tolerance;Impaired balance (sitting and/or standing);Impaired vision/perception;Decreased knowledge of use of DME or AE;Decreased knowledge of precautions   OT Treatment/Interventions: Self-care/ADL training;Therapeutic exercise;Energy conservation;DME and/or AE instruction;Therapeutic activities;Patient/family education;Balance training    OT Goals(Current goals can be found in the care plan section) Acute Rehab OT Goals Patient Stated Goal: rehab then home OT Goal Formulation: With patient/family Time For Goal Achievement: 09/13/16 Potential to Achieve Goals: Good ADL Goals Pt Will Perform Grooming: with supervision;standing Pt Will Perform Upper Body Bathing: with supervision;sitting Pt Will Perform Lower Body Bathing: with supervision;sit to/from stand Pt Will Transfer to Toilet: with supervision;ambulating;regular height toilet Pt Will Perform Toileting - Clothing Manipulation and hygiene: with supervision;sit to/from stand Additional ADL Goal #1: Pt will independently verbally recall 3 energy conservation strategies.  OT Frequency: Min 2X/week   Barriers to D/C:            Co-evaluation              End of Session Equipment Utilized During Treatment: Gait belt;Rolling walker Nurse Communication: Mobility status  Activity Tolerance: Patient tolerated treatment well Patient left: in bed;with call bell/phone within reach   Time: 6837-2902 OT Time Calculation (min): 26 min Charges:  OT General Charges $OT Visit: 1 Procedure OT Evaluation $OT Eval Moderate Complexity: 1 Procedure OT Treatments $Self Care/Home Management : 8-22 mins G-Codes:     Binnie Kand M.S., OTR/L Pager: [phone]  08/30/2016, 2:40 PM

## 2016-08-30 NOTE — Progress Notes (Addendum)
Patient ID: Jodi Burns, female   DOB: 07-Aug-1926, 83 y.o.   MRN: [id number]  PROGRESS NOTE    Jodi Burns  IOM:[id number] DOB: Dec 23, 1925 DOA: 08/20/2016  PCP: Jerlyn Ly, MD   Brief Narrative:  83 year old female with past medical history significant for COPD, atrial fibrillation, hypertension who presented to [facility] 08/20/2016 with worsening shortness of breath at rest and with exertion. She was found to have atrial fibrillation with rapid ventricular rate as well as lung nodule. She did not tolerate IV Cardizem due to hypotension. She was then started on by mouth Cardizem but remained symptomatic. She underwent cardioversion 08/29/2016 and amiodarone was added per cardiology.   Assessment & Plan:  Acute respiratory failure with hypoxia / Lung mass/GOLD COPD stage II - CT chest revealed showed ight upper lobe lung mass, consistent with primary bronchogenic carcinoma as well as irregular left upper lobe opacity which could represent a synchronous primary bronchogenic carcinoma - Pulmonology was consulted. Will need PET as outpatient and follow up with pulm in 2 weeks  - Continue Dulera 2 puffs twice a day, Atrovent nasal spray 1 to 2 when necessary for allergy - Respiratory status stable this am   Afib with RVR, rate improved now  - CHASVASC = 4 - Did not tolerate IV cardizem due to hypotension - Patient is currently on Cardizem 180 milligrams daily in addition to amiodarone 200 mg twice daily. 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   Failure to thrive in adult / moderate protein calorie malnutrition - In the context of chronic illness - Encourage by mouth intake    DVT Prophylaxis:on  therapeutic anticoagulation with apixaban Code Status:Full code Family Communication:Spoke with patient's husband at the bedside Disposition Plan:  PT recommended skilled nursing facility, appreciate social work assistance with placement   Consultants:   Cardiology  EP  Case management  PT/OT  Procedures:   Echocardiogram 12/27 - EF 60-65%, slighlty elevated PASP   Cardioversion   Antimicrobials:   None     Subjective: No overnight events  Objective: Vitals:   08/29/16 1452 08/29/16 2118 08/30/16 0516 08/30/16 0740  BP: 120/64 99/83 (!) 126/56   Pulse: 95 84 77   Resp: (!) '24 18 18   '$ Temp:  97.7 F (36.5 C) 97.9 F (36.6 C)   TempSrc:  Oral Oral   SpO2: 98% 98%  95%  Weight:      Height:        Intake/Output Summary (Last 24 hours) at 08/30/16 1200 Last data filed at 08/29/16 1800  Gross per 24 hour  Intake              340 ml  Output                0 ml  Net              340 ml   Filed Weights   08/27/16 0507 08/28/16 0450 08/29/16 0244  Weight: 56.7 kg (125 lb) 57 kg (125 lb 10.6 oz) 55.1 kg (121 lb 6.4 oz)    Examination:  General exam: Appears calm and comfortable  Respiratory system: Diminished breath sounds, no wheezing  Cardiovascular system: S1 & S2 heard, Rate controlled, No pedal edema.  Gastrointestinal system: Abdomen is nondistended, soft and nontender. No organomegaly or masses felt. Normal bowel sounds heard. Central nervous system: Alert and oriented. No focal neurological deficits. Extremities: Symmetric 5 x 5 power. Skin: No rashes, lesions or ulcers Psychiatry: Judgement and insight appear normal. Mood & affect appropriate.   Data Reviewed: I have personally reviewed following labs and imaging studies  CBC:  Recent Labs Lab 08/25/16 0445  WBC 12.0*  HGB 11.9*  HCT 37.1  MCV 97.1  PLT 937   Basic Metabolic Panel:  Recent Labs Lab 08/24/16 0153 08/25/16 1157  NA 141 138  K 4.1 3.9  CL 106 104  CO2 27 25    GLUCOSE 113* 105*  BUN 8 8  CREATININE 0.63 0.57  CALCIUM 8.8* 8.9   GFR: Estimated Creatinine Clearance: 38.4 mL/min (by C-G formula based on SCr of 0.57 mg/dL). Liver Function Tests: No results for input(s): AST, ALT, ALKPHOS, BILITOT, PROT, ALBUMIN in the last 168 hours. No results for input(s): LIPASE, AMYLASE in the last 168 hours. No results for input(s): AMMONIA in the last 168 hours. Coagulation Profile: No results for input(s): INR, PROTIME in the last 168 hours. Cardiac Enzymes: No results for input(s): CKTOTAL, CKMB, CKMBINDEX, TROPONINI in the last 168 hours. BNP (last 3 results) No results for input(s): PROBNP in the last 8760 hours. HbA1C: No results for input(s): HGBA1C in the last 72 hours. CBG:  Recent Labs Lab 08/30/16 0700  GLUCAP 92   Lipid Profile: No results for input(s): CHOL, HDL, LDLCALC, TRIG, CHOLHDL, LDLDIRECT in the last 72 hours. Thyroid Function Tests: No results for input(s): TSH, T4TOTAL, FREET4, T3FREE, THYROIDAB in the last 72 hours. Anemia Panel: No results for input(s): VITAMINB12, FOLATE, FERRITIN, TIBC, IRON, RETICCTPCT in the last 72 hours. Urine analysis:    Component Value Date/Time   COLORURINE YELLOW 08/21/2016 0705   APPEARANCEUR CLEAR 08/21/2016 0705   LABSPEC 1.031 (H) 08/21/2016 0705   PHURINE 5.0 08/21/2016 0705   GLUCOSEU NEGATIVE 08/21/2016 0705   HGBUR NEGATIVE 08/21/2016 0705   BILIRUBINUR NEGATIVE 08/21/2016 0705   KETONESUR NEGATIVE 08/21/2016 0705   PROTEINUR NEGATIVE 08/21/2016 0705   NITRITE NEGATIVE 08/21/2016 0705   LEUKOCYTESUR TRACE (A) 08/21/2016 0705   Sepsis Labs: '@LABRCNTIP'$ (procalcitonin:4,lacticidven:4)   MRSA PCR Screening     Status: None   Collection Time: 08/20/16  9:00 PM  Result Value Ref Range Status   MRSA by PCR NEGATIVE NEGATIVE Final      Radiology Studies: No results found.   Scheduled Meds: . amiodarone  200 mg Oral BID  . apixaban  2.5 mg Oral BID  . diltiazem  180 mg  Oral Daily  . dorzolamide  1 drop Both Eyes BID  . mometasone-formoterol  2 puff Inhalation BID  . sodium chloride flush  3 mL Intravenous Q12H  . timolol  1 drop Both Eyes BID   Continuous Infusions:   LOS: 10 days    Time spent: 25 minutes  Greater than 50% of the time spent on counseling and coordinating the care.   Leisa Lenz, MD [facility] Pager [phone]  If 7PM-7AM, please contact night-coverage [email or web address] Password [email or web address] 08/30/2016, 12:00 PM

## 2016-08-30 NOTE — Progress Notes (Signed)
Physical Therapy Treatment Patient Details Name: Jodi Burns MRN: [id number] DOB: 1926/03/14 Today's Date: 08/30/2016    History of Present Illness 83 y.o. female pmh significant for afib and COPD, former smoker, who presented with sob. URI 3 wks ago. Gradually weak over last 2 days. Dizziness.     PT Comments    Pt is progressing with mobility but slowly and pt very fatigued after each bout. She is mobilizing at min-guard A level but at this point would not recommend that she ben home alone. Given amount of time that she has been in bed, think it would be best for her to receive more therapy than she can in [facility] setting and thus recommending that she go to SNF for short bout of rehab first. Discussed this with pt and husband. If they choose to go home instead, would recommend HHPT as well as aide and 24 hr supervision. PT will continue to follow.   Follow Up Recommendations  SNF;Supervision/Assistance - 24 hour     Equipment Recommendations  None recommended by PT    Recommendations for Other Services       Precautions / Restrictions Precautions Precautions: Fall Restrictions Weight Bearing Restrictions: No    Mobility  Bed Mobility Overal bed mobility: Needs Assistance Bed Mobility: Supine to Sit     Supine to sit: Supervision     General bed mobility comments: HOB flat, no rail, took increased time but pt able to get to EOB, very fatigued once there though and needed to sit and rest before getting up  Transfers Overall transfer level: Needs assistance Equipment used: Rolling walker (2 wheeled) Transfers: Sit to/from Stand Sit to Stand: Supervision         General transfer comment: vc's for hand placement from bed and recliner. Increased time  Ambulation/Gait Ambulation/Gait assistance: Min guard;Min assist Ambulation Distance (Feet): 160 Feet (10', 50', 100') Assistive device: Rolling walker (2 wheeled) Gait Pattern/deviations: Step-through  pattern;Decreased stride length;Trunk flexed Gait velocity: decreased Gait velocity interpretation: <1.8 ft/sec, indicative of risk for recurrent falls General Gait Details: pt very fatigued after first 10', could not keep going and needed to sit and rest, min A given. After that though, mobility improved and she was able to ambulate 50' followed by seated rest. Was then able to ambulate back to room from stairs. Steady with RW but exhausted from effort of walking.     Stairs Stairs: Yes   Stair Management: Two rails;Step to pattern;Forwards;Sideways Number of Stairs: 9 General stair comments: min-guard going up steps and needed to rest after first 5. min A coming down sideways, took standing rest at bottom.   Wheelchair Mobility    Modified Rankin (Stroke Patients Only)       Balance Overall balance assessment: Needs assistance Sitting-balance support: No upper extremity supported;Feet supported Sitting balance-Leahy Scale: Good     Standing balance support: Single extremity supported;During functional activity Standing balance-Leahy Scale: Poor Standing balance comment: stable surface for safety, insecure in free standing                    Cognition Arousal/Alertness: Awake/alert Behavior During Therapy: WFL for tasks assessed/performed Overall Cognitive Status: Within Functional Limits for tasks assessed                      Exercises      General Comments General comments (skin integrity, edema, etc.): One O2 sat reading of 80% with first walk but question accuracy because  each reading after that >95%. HR in 90's bpm.       Pertinent Vitals/Pain Pain Assessment: No/denies pain    Home Living                      Prior Function            PT Goals (current goals can now be found in the care plan section) Acute Rehab PT Goals Patient Stated Goal: home PT Goal Formulation: With patient/family Time For Goal Achievement:  09/06/16 Potential to Achieve Goals: Good Progress towards PT goals: Progressing toward goals    Frequency    Min 3X/week      PT Plan Discharge plan needs to be updated    Co-evaluation             End of Session Equipment Utilized During Treatment: Gait belt Activity Tolerance: Patient limited by fatigue Patient left: in chair;with call bell/phone within reach;with family/visitor present     Time: 2800-3491 PT Time Calculation (min) (ACUTE ONLY): 45 min  Charges:  $Gait Training: 23-37 mins $Therapeutic Activity: 8-22 mins                    G Codes:     Leighton Roach, PT  Acute Rehab Services  [place] 08/30/2016, 10:34 AM

## 2016-08-30 NOTE — Progress Notes (Signed)
       Patient Name: Jodi Burns Date of Encounter: 08/30/2016    SUBJECTIVE: She feels well. Since amiodarone was discontinued, heart rate on average is increasing.  TELEMETRY:  Atrial flutter with variable AV block and heart rates up to 1:30 since discontinuation of amiodarone Vitals:   08/29/16 1452 08/29/16 2118 08/30/16 0516 08/30/16 0740  BP: 120/64 99/83 (!) 126/56   Pulse: 95 84 77   Resp: (!) '24 18 18   '$ Temp:  97.7 F (36.5 C) 97.9 F (36.6 C)   TempSrc:  Oral Oral   SpO2: 98% 98%  95%  Weight:      Height:        Intake/Output Summary (Last 24 hours) at 08/30/16 1300 Last data filed at 08/29/16 1800  Gross per 24 hour  Intake              340 ml  Output                0 ml  Net              340 ml     Radiology/Studies:  No new data  Physical Exam: Blood pressure (!) 126/56, pulse 77, temperature 97.9 F (36.6 C), temperature source Oral, resp. rate 18, height '5\' 2"'$  (1.575 m), weight 121 lb 6.4 oz (55.1 kg), last menstrual period 08/29/1967, SpO2 95 %. Weight change:   Wt Readings from Last 3 Encounters:  08/29/16 121 lb 6.4 oz (55.1 kg)  06/19/16 127 lb (57.6 kg)  01/20/16 128 lb (58.1 kg)    Lying flat in bed and in no extreme [place]. Cardiac exam reveals tachycardia without murmur. Chest is clear. Neck exam reveals no JVD.  ASSESSMENT:  1. Persistent atrial flutter: Now resolved after TEE guided cardioversion. Plan to continue amiodarone to maintain sinus rhythm. Plan 200 mg by mouth twice a day for 7 days followed by 200 mg daily thereafter.  2. Chronic anticoagulation has been started, Eliquis 2.5 mg twice a day.  3. Probable lung cancer, outpatient workup is planned  4. Acute on chronic diastolic heart failure without evidence of volume overload  5. Persistent fatigue and lassitude, possibly related to lung cancer  Plan:  1. Amiodarone 200 mg by mouth twice a day 1 week then 200 mg daily thereafter 2. Discontinue diltiazem after  a.m. dose on 08/31/2016 3. PT consult to establish physical needs. Long discussion with her husband this morning, she may need short-term skilled nursing facility for rehabilitation.   Signed, [place] 08/30/2016, 1:00 PM

## 2016-08-30 NOTE — Clinical Social Work Note (Signed)
Clinical Social Work Assessment  Patient Details  Name: Jodi Burns MRN: [id number] Date of Birth: 11/20/1925  Date of referral:  08/30/16               Reason for consult:  Discharge Planning                Permission sought to share information with:  Family Supports Permission granted to share information::  Yes, Verbal Permission Granted  Name::     Sophya Vanblarcom   Agency::     Relationship::  spouse  Contact Information:  [phone]  Housing/Transportation Living arrangements for the past 2 months:  Single Family Home Source of Information:  Patient, Spouse Patient Interpreter Needed:  None Criminal Activity/Legal Involvement Pertinent to Current Situation/Hospitalization:  No - Comment as needed Significant Relationships:  Adult Children, Spouse Lives with:  Spouse, Self Do you feel safe going back to the place where you live?  Yes Need for family participation in patient care:  No (Coment)  Care giving concerns:  Patients spouse and children are supportive   Facilities manager / plan: Holiday representative met patient at bedside to offer support and discuss patients needs at discharge. Patients husband was present during assessment. Patient/husband stated they are agreeable for patients to discharge to SNF but would prefer the [place] area. Patient and husband stated that before patients stay at [facility] they were looking into moving into [place] (ALF). CSW to complete necessary paperwork and initiate SNF search on patients behalf. CSW to follow up with patient once bed offers are available. CSW remains available for support and to facilitate patient discharge needs once medically stable  Employment status:  Retired Forensic scientist:  Medicare PT Recommendations:  [place] / Referral to community resources:  [place]  Patient/Family's Response to care: Patient verbalized appreciation and understanding  for CSW role and involvement in care. Patient agreeable with current discharge plan to SNF following discharge.   Patient/Family's Understanding of and Emotional Response to Diagnosis, Current Treatment, and Prognosis:  Patient with good understanding of current medical state and limitations around more recent hospitalization. Patent agreeable with SNF placement in hopes of transitioning to a more stable living environment.   Emotional Assessment Appearance:  Appears stated age Attitude/Demeanor/Rapport:  Other Affect (typically observed):  Calm Orientation:  Oriented to  Time, Oriented to Situation, Oriented to Place, Oriented to Self Alcohol / Substance use:  Not Applicable Psych involvement (Current and /or in the community):  No (Comment)  Discharge Needs  Concerns to be addressed:  No discharge needs identified Readmission within the last 30 days:  No Current discharge risk:  None Barriers to Discharge:  No Barriers Identified   Wende Neighbors, LCSW 08/30/2016, 2:04 PM

## 2016-08-30 NOTE — Progress Notes (Signed)
Clinical Social Worker met with patient/family at bedside. Family stated they have decided to go with [facility] as the patients SNF placement. CSW remains available for support.   Rhea Pink, MSW,  [place]

## 2016-08-30 NOTE — Clinical Social Work Placement (Signed)
   CLINICAL SOCIAL WORK PLACEMENT  NOTE  Date:  08/30/2016  Patient Details  Name: ALEERA GILCREASE MRN: [id number] Date of Birth: 09/07/1925  Clinical Social Work is seeking post-discharge placement for this patient at the [place] level of care (*CSW will initial, date and re-position this form in  chart as items are completed):  Yes   Patient/family provided with [place] Work Department's list of facilities offering this level of care within the geographic area requested by the patient (or if unable, by the patient's family).  Yes   Patient/family informed of their freedom to choose among providers that offer the needed level of care, that participate in Medicare, Medicaid or managed care program needed by the patient, have an available bed and are willing to accept the patient.  Yes   Patient/family informed of [facility]'s ownership interest in [facility] and [facility], as well as of the fact that they are under no obligation to receive care at these facilities.  PASRR submitted to EDS on       PASRR number received on       Existing PASRR number confirmed on       FL2 transmitted to all facilities in geographic area requested by pt/family on       FL2 transmitted to all facilities within larger geographic area on       Patient informed that his/her managed care company has contracts with or will negotiate with certain facilities, including the following:            Patient/family informed of bed offers received.  Patient chooses bed at       Physician recommends and patient chooses bed at      Patient to be transferred to   on  .  Patient to be transferred to facility by       Patient family notified on   of transfer.  Name of family member notified:        PHYSICIAN Please sign FL2     Additional Comment:    _______________________________________________ Wende Neighbors, LCSW 08/30/2016, 2:11 PM

## 2016-08-31 DIAGNOSIS — E440 Moderate protein-calorie malnutrition: Secondary | ICD-10-CM | POA: Diagnosis not present

## 2016-08-31 DIAGNOSIS — R2689 Other abnormalities of gait and mobility: Secondary | ICD-10-CM | POA: Diagnosis not present

## 2016-08-31 DIAGNOSIS — R531 Weakness: Secondary | ICD-10-CM | POA: Diagnosis not present

## 2016-08-31 DIAGNOSIS — J449 Chronic obstructive pulmonary disease, unspecified: Secondary | ICD-10-CM | POA: Diagnosis not present

## 2016-08-31 DIAGNOSIS — J45909 Unspecified asthma, uncomplicated: Secondary | ICD-10-CM | POA: Diagnosis not present

## 2016-08-31 DIAGNOSIS — J441 Chronic obstructive pulmonary disease with (acute) exacerbation: Secondary | ICD-10-CM | POA: Diagnosis not present

## 2016-08-31 DIAGNOSIS — I2510 Atherosclerotic heart disease of native coronary artery without angina pectoris: Secondary | ICD-10-CM | POA: Diagnosis not present

## 2016-08-31 DIAGNOSIS — I480 Paroxysmal atrial fibrillation: Secondary | ICD-10-CM | POA: Diagnosis not present

## 2016-08-31 DIAGNOSIS — R0609 Other forms of dyspnea: Secondary | ICD-10-CM | POA: Diagnosis not present

## 2016-08-31 DIAGNOSIS — R488 Other symbolic dysfunctions: Secondary | ICD-10-CM | POA: Diagnosis not present

## 2016-08-31 DIAGNOSIS — I10 Essential (primary) hypertension: Secondary | ICD-10-CM | POA: Diagnosis not present

## 2016-08-31 DIAGNOSIS — F4323 Adjustment disorder with mixed anxiety and depressed mood: Secondary | ICD-10-CM | POA: Diagnosis not present

## 2016-08-31 DIAGNOSIS — R911 Solitary pulmonary nodule: Secondary | ICD-10-CM | POA: Diagnosis not present

## 2016-08-31 DIAGNOSIS — C3411 Malignant neoplasm of upper lobe, right bronchus or lung: Secondary | ICD-10-CM | POA: Diagnosis not present

## 2016-08-31 DIAGNOSIS — I482 Chronic atrial fibrillation: Secondary | ICD-10-CM | POA: Diagnosis not present

## 2016-08-31 DIAGNOSIS — I700 Atherosclerosis of aorta: Secondary | ICD-10-CM | POA: Diagnosis not present

## 2016-08-31 DIAGNOSIS — R609 Edema, unspecified: Secondary | ICD-10-CM | POA: Diagnosis not present

## 2016-08-31 DIAGNOSIS — Z87891 Personal history of nicotine dependence: Secondary | ICD-10-CM | POA: Diagnosis not present

## 2016-08-31 DIAGNOSIS — R627 Adult failure to thrive: Secondary | ICD-10-CM

## 2016-08-31 DIAGNOSIS — I5033 Acute on chronic diastolic (congestive) heart failure: Secondary | ICD-10-CM | POA: Diagnosis not present

## 2016-08-31 DIAGNOSIS — J439 Emphysema, unspecified: Secondary | ICD-10-CM | POA: Diagnosis not present

## 2016-08-31 DIAGNOSIS — R918 Other nonspecific abnormal finding of lung field: Secondary | ICD-10-CM | POA: Diagnosis not present

## 2016-08-31 DIAGNOSIS — C3412 Malignant neoplasm of upper lobe, left bronchus or lung: Secondary | ICD-10-CM | POA: Diagnosis not present

## 2016-08-31 DIAGNOSIS — J90 Pleural effusion, not elsewhere classified: Secondary | ICD-10-CM | POA: Diagnosis not present

## 2016-08-31 DIAGNOSIS — H6121 Impacted cerumen, right ear: Secondary | ICD-10-CM | POA: Diagnosis not present

## 2016-08-31 DIAGNOSIS — F419 Anxiety disorder, unspecified: Secondary | ICD-10-CM | POA: Diagnosis not present

## 2016-08-31 DIAGNOSIS — M6281 Muscle weakness (generalized): Secondary | ICD-10-CM | POA: Diagnosis not present

## 2016-08-31 DIAGNOSIS — I951 Orthostatic hypotension: Secondary | ICD-10-CM | POA: Diagnosis not present

## 2016-08-31 DIAGNOSIS — I4891 Unspecified atrial fibrillation: Secondary | ICD-10-CM | POA: Diagnosis not present

## 2016-08-31 DIAGNOSIS — J9601 Acute respiratory failure with hypoxia: Secondary | ICD-10-CM | POA: Diagnosis not present

## 2016-08-31 LAB — BASIC METABOLIC PANEL
Anion gap: 8 (ref 5–15)
BUN: 6 mg/dL (ref 6–20)
CHLORIDE: 101 mmol/L (ref 101–111)
CO2: 28 mmol/L (ref 22–32)
CREATININE: 0.52 mg/dL (ref 0.44–1.00)
Calcium: 8.8 mg/dL — ABNORMAL LOW (ref 8.9–10.3)
GFR calc Af Amer: >60 mL/min (ref >60)
GFR calc non Af Amer: >60 mL/min (ref >60)
GLUCOSE: 113 mg/dL — AB (ref 65–99)
Potassium: 3.2 mmol/L — ABNORMAL LOW (ref 3.5–5.1)
Sodium: 137 mmol/L (ref 135–145)

## 2016-08-31 LAB — CBC
HEMATOCRIT: 37.9 % (ref 36.0–46.0)
HEMOGLOBIN: 12.3 g/dL (ref 12.0–15.0)
MCH: 30.7 pg (ref 26.0–34.0)
MCHC: 32.5 g/dL (ref 30.0–36.0)
MCV: 94.5 fL (ref 78.0–100.0)
Platelets: 349 10*3/uL (ref 150–400)
RBC: 4.01 MIL/uL (ref 3.87–5.11)
RDW: 14.1 % (ref 11.5–15.5)
WBC: 10.6 10*3/uL — ABNORMAL HIGH (ref 4.0–10.5)

## 2016-08-31 MED ORDER — POTASSIUM CHLORIDE CRYS ER 20 MEQ PO TBCR
40.0000 meq | EXTENDED_RELEASE_TABLET | Freq: Once | ORAL | Status: AC
  Administered 2016-08-31: 40 meq via ORAL
  Filled 2016-08-31: qty 2

## 2016-08-31 MED ORDER — IPRATROPIUM-ALBUTEROL 0.5-2.5 (3) MG/3ML IN SOLN: 3.0000 mL | Freq: Four times a day (QID) | RESPIRATORY_TRACT | Status: DC | PRN

## 2016-08-31 MED ORDER — LORAZEPAM 2 MG/ML IJ SOLN: 0.2500 mg | Freq: Once | INTRAMUSCULAR | Status: DC

## 2016-08-31 MED ORDER — AMIODARONE HCL 200 MG PO TABS: 200.0000 mg | ORAL_TABLET | Freq: Two times a day (BID) | ORAL | 0 refills | Status: DC

## 2016-08-31 MED ORDER — IPRATROPIUM-ALBUTEROL 0.5-2.5 (3) MG/3ML IN SOLN
3.0000 mL | Freq: Four times a day (QID) | RESPIRATORY_TRACT | Status: DC
  Administered 2016-08-31 (×2): 3 mL via RESPIRATORY_TRACT
  Filled 2016-08-31 (×2): qty 3

## 2016-08-31 MED ORDER — IPRATROPIUM-ALBUTEROL 0.5-2.5 (3) MG/3ML IN SOLN
3.0000 mL | RESPIRATORY_TRACT | 0 refills | Status: DC | PRN
Start: 2016-08-31 — End: 2016-11-07

## 2016-08-31 MED ORDER — AMIODARONE HCL 200 MG PO TABS: 200.0000 mg | ORAL_TABLET | Freq: Every day | ORAL | 0 refills | Status: DC

## 2016-08-31 MED ORDER — IPRATROPIUM-ALBUTEROL 0.5-2.5 (3) MG/3ML IN SOLN: 3.0000 mL | Freq: Two times a day (BID) | RESPIRATORY_TRACT | Status: DC

## 2016-08-31 MED ORDER — IPRATROPIUM-ALBUTEROL 0.5-2.5 (3) MG/3ML IN SOLN: 3.0000 mL | Freq: Four times a day (QID) | RESPIRATORY_TRACT | 0 refills | Status: DC | PRN

## 2016-08-31 MED ORDER — POTASSIUM CHLORIDE CRYS ER 20 MEQ PO TBCR: 20.0000 meq | EXTENDED_RELEASE_TABLET | Freq: Once | ORAL | Status: DC

## 2016-08-31 NOTE — Progress Notes (Signed)
Pt complained of SOB and feeling like she cant catch her breath. HOB elevated. Pt placed on 2L O2. No audible wheezing. O2 stat 98%. [facility] notified. Order placed for Neb treatment Q6h and Ativan 0.'25mg'$  PO once. Will continue to monitor. Isac Caddy, RN

## 2016-08-31 NOTE — Addendum Note (Signed)
Addendum  created 08/31/16 1404 by Effie Berkshire, MD   Anesthesia Attestations deleted, Anesthesia Attestations filed

## 2016-08-31 NOTE — Care Management Note (Signed)
Case Management Note Marvetta Gibbons RN, BSN Unit 2W-Case Manager [phone]  Patient Details  Name: Jodi Burns MRN: [id number] Date of Birth: 1926-02-13  Subjective/Objective:  Pt admitted with afib/SOB                  Action/Plan: PTA pt lived at home with spouse- has been started on Eliquis- per insurance check- S/W [name] @ [facility] # [phone]   ELIQUIS 2.5 MG BID 30 / 60 TAB   COVER- YES  CO-PAY- $ 35.00  TIER- 3 DRUG  PRIOR APPROVAL- YES # [phone]  PHARMACY : [facility], [facility] , [facility] AND [facility]   Spoke with pt/son/grandson at bedside- per conversation pt uses can mostly, but also has RW at home, open to [facility] services but would like to wait for husband to be present. Discussed Eliquis coverage and 30 day free card given to son to use on discharge. CM to continue to follow for d/c needs.    Expected Discharge Date:    08/31/16            Expected Discharge Plan:  [place]  In-House Referral:  Clinical Social Work  Discharge planning Services  CM Consult, Medication Assistance  Post Acute Care Choice:  Home Health Choice offered to:  Patient, Spouse, Adult Children  DME Arranged:    DME Agency:     HH Arranged:    [place] Agency:     Status of Service:  Completed, signed off  If discussed at [place] of Stay Meetings, dates discussed:  08/31/16  Discharge Disposition: skilled facility   Additional Comments:  08/31/16- 1140- Khari Mally RN, CM- pt to d/c today to [facility]- CSW following for placement needs  08/30/16- 1230- Nichael Ehly RN, CM- spoke with pt, spouse and daughter at bedside regarding d/c plans- discussion was had over d/c recommendations for [facility], also family has looked into [facility]- went over the differences between going home with HH vs ALF with HH vs STSNF. Pt and family have decided that they would like to look into STSNF options- CSW has already spoken with them and has started bed search- list  for private duty agencies provided to family per request for future reference if needed when pt either goes to [facility] or Home. CSW to f/u with pt and family later this afternoon regarding STSNF bed offers. CM will remain available for any further needs.   Dawayne Patricia, RN 08/31/2016, 11:39 AM

## 2016-08-31 NOTE — Discharge Summary (Addendum)
Physician Discharge Summary  Jodi Burns:[id number] DOB: 1925-12-05 DOA: 08/20/2016  PCP: Jerlyn Ly, MD  Admit date: 08/20/2016 Discharge date: 08/31/2016  Recommendations for Outpatient Follow-up:  Amiodarone 200 mg BID for 10 days through 09/10/2016 and then from 09/11/2016 continue 200 mg once a day. Recommend scheduling the lung PET scan (per cardio already done by Dr. Joylene Draft, the primary physician). After PET scan can follow-up with pulmonology. If lung biopsy becomes an issue, anticoagulation therapy should be paused for 48-72 hours prior to the procedure.  Discharge Diagnoses:  Principal Problem:   DOE (dyspnea on exertion) Active Problems:   Allergic rhinitis due to pollen   PAF (paroxysmal atrial fibrillation) (HCC)   Essential hypertension   Atrial fibrillation with RVR (HCC)   Lung mass   Elevated troponin   COPD GOLD II   Other abnormalities of gait and mobility   Atypical atrial flutter ([place])    Discharge Condition: stable   Diet recommendation: as tolerated   History of present illness:  83 year old female with past medical history significant for COPD, atrial fibrillation, hypertension who presented to [facility] 08/20/2016 with worsening shortness of breath at rest and with exertion. She was found to have atrial fibrillation with rapid ventricular rate as well as lung nodule. She did not tolerate IV Cardizem due to hypotension. She was then started on by mouth Cardizem but remained symptomatic. She underwent cardioversion 08/29/2016 and amiodarone was added per cardiology.   Hospital Course:  Acute respiratory failure with hypoxia / Lung mass/GOLD COPD stage II - CT chest revealed showed ight upper lobe lung mass, consistent with primary bronchogenic carcinoma as well as irregular left upper lobe opacity which could represent a synchronous primary bronchogenic carcinoma - Pulmonology was consulted. Will need PET as outpatient and follow up with pulm  in 2 weeks  - Continue Dulera 2 puffs twice a day, Atrovent nasal spray 1 to 2 when necessary for allergy - Continue duoneb sch and as needed as prescribed for shortness of breath or wheezing  - Respiratory status stable this am   Afib with RVR, rate improved now  - CHASVASC = 4 - Did not tolerate IV cardizem due to hypotension - Patient is currently on Cardizem 180 milligrams daily in addition to amiodarone 200 mg twice daily. 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  Failure to thrive in adult / moderate protein calorie malnutrition - In the context of chronic illness - Encourage by mouth intake    DVT Prophylaxis: on therapeutic anticoagulation with apixaban Code Status:Full code Family Communication:Spoke with patient's husband at the bedside    Consultants:  Cardiology  EP  Case management  PT/OT  Procedures:  Echocardiogram 12/27 - EF 60-65%, slighlty elevated PASP   Cardioversion   Antimicrobials:   None     Signed:  Leisa Lenz, MD  [facility] 08/31/2016, 11:31 AM  Pager #: [phone]  Time spent in minutes: less than 30 minutes   Discharge Exam: Vitals:   08/30/16 2159 08/31/16 0550  BP: (!) 156/55 (!) 137/58  Pulse: 80 71  Resp: 18 18  Temp: 97.5 F (36.4 C) 98.2 F (36.8 C)   Vitals:   08/30/16 2159 08/31/16 0218 08/31/16 0550 08/31/16 0758  BP: (!) 156/55  Marland Kitchen)  137/58   Pulse: 80  71   Resp: 18  18   Temp: 97.5 F (36.4 C)  98.2 F (36.8 C)   TempSrc: Oral  Oral   SpO2: 98% 98% 99% 92%  Weight:   55.7 kg (122 lb 11.2 oz)   Height:        General: Pt is alert, follows commands appropriately, not in acute distress Cardiovascular: Regular  rate and rhythm, S1/S2 + Respiratory: Clear to auscultation bilaterally, no wheezing, no crackles, no rhonchi Abdominal: Soft, non tender, non distended, bowel sounds +, no guarding Extremities: no edema, no cyanosis, pulses palpable bilaterally DP and PT Neuro: Grossly nonfocal  Discharge Instructions  Discharge Instructions    Amb referral to AFIB Clinic    Complete by:  As directed    Call MD for:  persistant nausea and vomiting    Complete by:  As directed    Call MD for:  redness, tenderness, or signs of infection (pain, swelling, redness, odor or green/yellow discharge around incision site)    Complete by:  As directed    Call MD for:  severe uncontrolled pain    Complete by:  As directed    Diet - low sodium heart healthy    Complete by:  As directed    Discharge instructions    Complete by:  As directed    Amiodarone 200 mg BID for 10 days through 09/10/2016 and then from 09/11/2016 continue 200 mg once a day. Recommend scheduling the lung PET scan (already done by Dr. Joylene Draft, the primary physician). After PET scan can follow-up with pulmonology. If lung biopsy becomes an issue, anticoagulation therapy should be paused for 48-72 hours prior to the procedure.   Increase activity slowly    Complete by:  As directed      Allergies as of 08/31/2016      Reactions   Cefaclor    REACTION: gi upset   Codeine Nausea And Vomiting      Medication List    STOP taking these medications   digoxin 0.125 MG tablet Commonly known as:  LANOXIN     TAKE these medications   acetaminophen 325 MG tablet Commonly known as:  TYLENOL Take 650 mg by mouth every 6 (six) hours as needed for mild pain.   albuterol 108 (90 Base) MCG/ACT inhaler Commonly known as:  PROAIR HFA Inhale 2 puffs into the lungs every 6 (six) hours as needed for wheezing or shortness of breath.   amiodarone 400 MG tablet Commonly known as:  PACERONE Take 1 tablet (400 mg total) by mouth 2 (two) times daily.    amiodarone 200 MG tablet Commonly known as:  PACERONE Take 1 tablet (200 mg total) by mouth 2 (two) times daily.   amiodarone 200 MG tablet Commonly known as:  PACERONE Take 1 tablet (200 mg total) by mouth daily. Start taking on:  09/11/2016   apixaban 2.5 MG Tabs tablet Commonly known as:  ELIQUIS Take 1 tablet (2.5 mg total) by mouth 2 (two) times daily.   budesonide-formoterol 160-4.5 MCG/ACT inhaler Commonly known as:  SYMBICORT Inhale 2 puffs into the lungs 2 (two) times daily. Rinse mouth   CoQ10 200 MG Caps Take 1 capsule by mouth once.   cyanocobalamin 1000 MCG tablet Take 2,000 mcg by mouth daily.   diltiazem 180 MG 24 hr capsule Commonly known as:  CARDIZEM CD Take 1 capsule (180 mg total) by mouth daily.   dorzolamide-timolol 22.3-6.8 MG/ML ophthalmic solution Commonly  known as:  COSOPT Place 1 drop into both eyes 2 (two) times daily.   fluocinonide 0.05 % external solution Commonly known as:  LIDEX Apply 1 application topically once a week. scalp   ipratropium 0.03 % nasal spray Commonly known as:  ATROVENT 1-2 puffs each nostril every 8 hours if needed   ipratropium-albuterol 0.5-2.5 (3) MG/3ML Soln Commonly known as:  DUONEB Take 3 mLs by nebulization every 4 (four) hours as needed.   ipratropium-albuterol 0.5-2.5 (3) MG/3ML Soln Commonly known as:  DUONEB Take 3 mLs by nebulization every 6 (six) hours as needed.   loratadine 10 MG tablet Commonly known as:  CLARITIN Take 10 mg by mouth daily as needed for allergies.   PRESERVISION/LUTEIN Caps Take 1 capsule by mouth 2 (two) times daily.   Vitamin D3 5000 units Tabs Take 2 tablets by mouth daily.       Contact information for follow-up providers    PERINI,MARK A, MD. Schedule an appointment as soon as possible for a visit.   Specialty:  Internal Medicine Contact information: [place] [address] [phone]            Contact information for after-discharge care     Destination    [facility] Follow up.   Specialty:  [place] information: [address] [place] [phone]                   The results of significant diagnostics from this hospitalization (including imaging, microbiology, ancillary and laboratory) are listed below for reference.    Significant Diagnostic Studies: Dg Chest 2 View  Result Date: 08/20/2016 CLINICAL DATA:  Shortness of breath, weakness and dizziness since yesterday. EXAM: CHEST  2 VIEW COMPARISON:  PA and lateral chest 03/28/2007. FINDINGS: The patient has a new mass in the right upper lobe measuring 3.2 cm transverse by 4.2 cm craniocaudal by 4.4 cm AP. The lungs appear emphysematous. Heart size is upper normal. Aorta atherosclerosis is noted. 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 Emphysema. Electronically Signed   By: Inge Rise M.D.   On: 08/20/2016 13:14   Ct Angio Chest Pe W And/or Wo Contrast  Result Date: 08/20/2016 CLINICAL DATA:  Shortness of breath.  Rule out pulmonary embolism. EXAM: CT ANGIOGRAPHY CHEST WITH CONTRAST TECHNIQUE: Multidetector CT imaging of the chest was performed using the standard protocol during bolus administration of intravenous contrast. Multiplanar CT image reconstructions and MIPs were obtained to evaluate the vascular anatomy. CONTRAST:  100 cc of Isovue 370 COMPARISON:  Chest radiograph of earlier today.  No prior CT. FINDINGS: Cardiovascular: The quality of this exam for evaluation of pulmonary embolism is good. No evidence of pulmonary embolism. Mild cardiomegaly, with LAD coronary artery atherosclerosis. Advanced aortic and branch vessel atherosclerosis. Tortuous thoracic aorta. Mediastinum/Nodes: No mediastinal or hilar adenopathy. Lungs/Pleura: No pleural fluid. Lower lobe predominant bronchial wall thickening. Mild centrilobular emphysema. Right upper lobe lung  mass, 3.4 x 4.0 cm on image 24/series 407. 3.9 cm craniocaudal. 4 mm right lower lobe pulmonary nodule on image 92/series 407. Spiculated left upper lobe density measures 1.2 x 0.8 cm on image 23/series 4 and 7. Vague 5 mm left lower lobe pulmonary nodule on image 63/series 47. Upper Abdomen: Old granulomatous disease in the liver and spleen. Normal imaged portions of the stomach, pancreas, gallbladder, adrenal glands, kidneys. Abdominal aortic atherosclerosis. Musculoskeletal: Left rotator cuff repair. Review of the MIP images confirms  the above findings. IMPRESSION: 1.  No evidence of pulmonary embolism. 2. Right upper lobe lung mass, consistent with primary bronchogenic carcinoma. No thoracic adenopathy. 3. Irregular left upper lobe opacity could represent a synchronous primary bronchogenic carcinoma. A focus of infection is felt less likely. 4. Scattered nonspecific smaller pulmonary nodules. 5.  Coronary artery atherosclerosis. Aortic atherosclerosis. Electronically Signed   By: Abigail Miyamoto M.D.   On: 08/20/2016 16:18    Microbiology: No results found for this or any previous visit (from the past 240 hour(s)).   Labs: Basic Metabolic Panel:  Recent Labs Lab 08/25/16 1157 08/31/16 0158  NA 138 137  K 3.9 3.2*  CL 104 101  CO2 25 28  GLUCOSE 105* 113*  BUN 8 6  CREATININE 0.57 0.52  CALCIUM 8.9 8.8*   Liver Function Tests: No results for input(s): AST, ALT, ALKPHOS, BILITOT, PROT, ALBUMIN in the last 168 hours. No results for input(s): LIPASE, AMYLASE in the last 168 hours. No results for input(s): AMMONIA in the last 168 hours. CBC:  Recent Labs Lab 08/25/16 0445 08/31/16 0158  WBC 12.0* 10.6*  HGB 11.9* 12.3  HCT 37.1 37.9  MCV 97.1 94.5  PLT 227 349   Cardiac Enzymes: No results for input(s): CKTOTAL, CKMB, CKMBINDEX, TROPONINI in the last 168 hours. BNP: BNP (last 3 results)  Recent Labs  08/20/16 1414  BNP 201.5*    ProBNP (last 3 results) No results for  input(s): PROBNP in the last 8760 hours.  CBG:  Recent Labs Lab 08/30/16 0700  GLUCAP 92

## 2016-08-31 NOTE — Progress Notes (Addendum)
       Patient Name: Jodi Burns Date of Encounter: 08/31/2016    SUBJECTIVE: Maintaining normal sinus rhythm. Still has some absence of energy and fatigue.  TELEMETRY:  Normal sinus rhythm without recurrent atrial fibrillation. Vitals:   08/30/16 2159 08/31/16 0218 08/31/16 0550 08/31/16 0758  BP: (!) 156/55  (!) 137/58   Pulse: 80  71   Resp: 18  18   Temp: 97.5 F (36.4 C)  98.2 F (36.8 C)   TempSrc: Oral  Oral   SpO2: 98% 98% 99% 92%  Weight:   122 lb 11.2 oz (55.7 kg)   Height:        Intake/Output Summary (Last 24 hours) at 08/31/16 0901 Last data filed at 08/30/16 2208  Gross per 24 hour  Intake              720 ml  Output                0 ml  Net              720 ml   LABS: Basic Metabolic Panel:  Recent Labs  08/31/16 0158  NA 137  K 3.2*  CL 101  CO2 28  GLUCOSE 113*  BUN 6  CREATININE 0.52  CALCIUM 8.8*   CBC:  Recent Labs  08/31/16 0158  WBC 10.6*  HGB 12.3  HCT 37.9  MCV 94.5  PLT 349    Radiology/Studies:  No new radiologic studies  Physical Exam: Blood pressure (!) 137/58, pulse 71, temperature 98.2 F (36.8 C), temperature source Oral, resp. rate 18, height '5\' 2"'$  (1.575 m), weight 122 lb 11.2 oz (55.7 kg), last menstrual period 08/29/1967, SpO2 92 %. Weight change:   Wt Readings from Last 3 Encounters:  08/31/16 122 lb 11.2 oz (55.7 kg)  06/19/16 127 lb (57.6 kg)  01/20/16 128 lb (58.1 kg)    Frail appearing Ambulating Chest is clear Cardiac exam reveals no murmur or gallop Extremities reveal no edema  ASSESSMENT:  1. Maintaining sinus rhythm post-TEE guided cardioversion of atrial flutter to normal sinus rhythm. Plan is to continue amiodarone as a rhythm control agent. 7-10 days of 200 mg by mouth twice a day and then decrease to 200 mg daily thereafter. 2. Probable lung cancer. The patient and family have decided to aggressively evaluate and therefore she needs to be scheduled for a PET scan followed by pulmonary  follow-up. 3. Hypokalemia 4. Physical deconditioning 5. Resolved acute on chronic diastolic heart failure.  Plan:  1. Amiodarone 200 mg by mouth twice a day 10 days then switch to 200 mg daily. Cardiology follow-up in one month needs to be scheduled. I have discontinued diltiazem. 2. Recommend scheduling the lung PET scan (already done by Dr. Joylene Draft, the primary physician). After PET scan can follow-up with pulmonology. If lung biopsy becomes an issue, anticoagulation therapy should be paused for 48-72 hours prior to the procedure. 3. Replete potassium. Plan to give 40 mEq twice today. 4. [facility] facility for short-term rehabilitation. We will sign off.   Please call if concerns or questions.  Signed, [place] 08/31/2016, 9:01 AM

## 2016-08-31 NOTE — Progress Notes (Signed)
Clinical Social Worker facilitated patient discharge including contacting patient family and facility to confirm patient discharge plans.  Clinical information faxed to facility and family agreeable with plan.  CSW arranged ambulance transport via [facility] to [place] .  RN Rosanne Ashing to call [phone] for report prior to discharge.  Clinical Social Worker will sign off for now as social work intervention is no longer needed. Please consult [place] again if new need arises.  Rhea Pink, MSW, [place]

## 2016-09-01 ENCOUNTER — Encounter: Payer: Self-pay | Admitting: Internal Medicine

## 2016-09-01 ENCOUNTER — Non-Acute Institutional Stay (SKILLED_NURSING_FACILITY): Payer: Medicare Other | Admitting: Internal Medicine

## 2016-09-01 DIAGNOSIS — I4891 Unspecified atrial fibrillation: Secondary | ICD-10-CM

## 2016-09-01 DIAGNOSIS — R918 Other nonspecific abnormal finding of lung field: Secondary | ICD-10-CM | POA: Diagnosis not present

## 2016-09-01 DIAGNOSIS — J449 Chronic obstructive pulmonary disease, unspecified: Secondary | ICD-10-CM | POA: Diagnosis not present

## 2016-09-01 NOTE — Assessment & Plan Note (Signed)
Continue present pulmonary toilet

## 2016-09-01 NOTE — Assessment & Plan Note (Signed)
As per Pulmonary

## 2016-09-01 NOTE — Assessment & Plan Note (Addendum)
Remains in regular rhythm  Continue CCB & Eliquis

## 2016-09-01 NOTE — Progress Notes (Signed)
This is a comprehensive admission note to [facility] performed on this date less than 30 days from date of admission. Included are preadmission medical/surgical history;reconciled medication list; family history; social history and comprehensive review of systems.  Corrections and additions to the records were documented . Comprehensive physical exam was also performed. Additionally a clinical summary was entered for each active diagnosis pertinent to this admission in the Problem List to enhance continuity of care.  BRA:XENMMH, Elta Guadeloupe MD  HPI: Patient was hospitalized 08/20/16-08/31/16 for progressive dyspnea even at at rest and worse with exertion. Atrial fibrillation with rapid ventricular response noted.  IV Cardizem  could not be tolerated due to hypotension. She is classified CHADsVASC 4.She was able to tolerate Cardizem 180 mg daily . Eliquis 2.5 mg bid was added. Echo 12/27 revealed ejection fraction 60-65 percent and slightly elevated PA systolic pressure.Cardio version was completed 08/29/16 and amiodarone was added as 200 mg twice a day through 1/14. As of 1/15 the dose to be 200 mg once daily.  Additionally she had a lung nodule as an incidental finding. She does have a past history of histoplasmosis. CT scan revealed right upper lobe mass consistent with primary bronchogenic carcinoma.Additionally an irregular left upper lobe opacity suggesting possible synchronous primary bronchogenic carcinoma was present.Outpaoient PET scan will be scheduled with follow-up with pulmonology in 2 weeks. She has COPD Gold stage II. Respiratory status judged to be stable at discharge.  Hypokalemia was corrected.  Past medical and surgical history: Includes asthma,venous thrombosis and embolism, dyslipidemia, osteopenia, history of histoplasmosis with blindness due to retinal involvement. Surgeries include cardioversion and breast biopsy.  Social history: Social drinker, smoked 3/4 pack per  day [phone].  Family history: Reviewed  Review of systems: She's had pain in the right shoulder blade for several days which she attributes to position or using a walker. Tylenol helps the pain.  Her exertional dyspnea has been progressive over the last year. She is short of breath walking 15-20 feet. She's had sore throat since the cardio version. She admits to slight depression but does not want to take medication.  Constitutional: No fever,significant weight change, fatigue  Eyes: No redness, discharge, pain, vision change ENT/mouth: No nasal congestion,  purulent discharge, earache,change in hearing loss  Cardiovascular: No chest pain, palpitations,paroxysmal nocturnal dyspnea, claudication, edema  Respiratory: No cough, sputum production,hemoptysis, significant snoring,apnea   Gastrointestinal: No heartburn,dysphagia,abdominal pain, nausea / vomiting,rectal bleeding, melena,change in bowels Genitourinary: No dysuria,hematuria, pyuria,  incontinence, nocturia Musculoskeletal: No joint stiffness, joint swelling,  Dermatologic: No rash, pruritus, change in appearance of skin Neurologic: No dizziness,headache,syncope, seizures, numbness , tingling Psychiatric: No significant anxiety , depression, insomnia, anorexia Endocrine: No change in hair/skin/ nails, excessive thirst, excessive hunger, excessive urination  Hematologic/lymphatic: No significant bruising, lymphadenopathy,abnormal bleeding Allergy/immunology: No itchy/ watery eyes, significant sneezing, urticaria, angioedema  Physical exam:  Pertinent or positive findings: She appears frail and chronically ill. She has decreased auditory acuity. The uvula is small, there is a hematoma at its tip. A soft grade 1/2-three/ fourths murmur is present at the base. She has minor inspiratory rhonchi in a  homogenous distribution. Breath sounds overall are decreased. Clubbing of the nailbeds is present. She has trace edema at the sock line. Pedal  pulses are decreased.  General appearance: no acute distress , increased work of breathing is present @ rest.   Lymphatic: No lymphadenopathy about the head, neck, axilla . Eyes: No conjunctival inflammation or lid edema is present. There is no  scleral icterus. Ears:  External ear exam shows no significant lesions or deformities.   Nose:  External nasal examination shows no deformity or inflammation. Nasal mucosa are pink and moist without lesions ,exudates Oral exam: lips and gums are healthy appearing.There is no oropharyngeal erythema or exudate . Neck:  No thyromegaly, masses, tenderness noted.    Heart:  Normal rate and regular rhythm. S1 and S2 normal without gallop, click, rub .  Abdomen:Bowel sounds are normal. Abdomen is soft and nontender with no organomegaly, hernias,masses. GU: deferred  Extremities:  No cyanosis  Skin: Warm & dry w/o tenting. No significant lesions or rash.  See clinical summary under each active problem in the Problem List with associated updated therapeutic plan

## 2016-09-01 NOTE — Patient Instructions (Signed)
See Current Assessment & Plan in Problem List under specific Diagnosis 

## 2016-09-04 ENCOUNTER — Other Ambulatory Visit (HOSPITAL_COMMUNITY): Payer: Self-pay | Admitting: Internal Medicine

## 2016-09-04 DIAGNOSIS — R918 Other nonspecific abnormal finding of lung field: Secondary | ICD-10-CM

## 2016-09-05 ENCOUNTER — Encounter: Payer: Self-pay | Admitting: Internal Medicine

## 2016-09-05 ENCOUNTER — Non-Acute Institutional Stay (SKILLED_NURSING_FACILITY): Payer: Medicare Other | Admitting: Internal Medicine

## 2016-09-05 DIAGNOSIS — R609 Edema, unspecified: Secondary | ICD-10-CM | POA: Diagnosis not present

## 2016-09-05 DIAGNOSIS — R0609 Other forms of dyspnea: Secondary | ICD-10-CM | POA: Diagnosis not present

## 2016-09-05 NOTE — Progress Notes (Signed)
    Facility Location: [facility]  Room Number: 213-A  This is a nursing facility follow up for specific acute issue of Edema.  Interim medical record and care since last [place] visit was updated with review of diagnostic studies and change in clinical status since last visit were documented.  HPI: The patient and husband validate that she's had edema approximately 48 hours without  specific predisposition. She denies any other cardiopulmonary symptoms except for profound weakness or shortness of breath after ambulation to and from the bathroom. She has COPD and has had progressive shortness of breath over the last year. She does have some discomfort in the right scapular area with deep breathing or certain position changes. Tylenol resolves the discomfort. Amiodarone dose is on a weaning schedule. She remains on Eliquis oral anticoagulant.  Review of systems:  Constitutional: No fever,significant weight change, fatigue  Eyes: No redness, discharge, pain, vision change ENT/mouth: No nasal congestion,  purulent discharge, earache,change in hearing ,sore throat  Cardiovascular: No chest pain, palpitations,paroxysmal nocturnal dyspnea, claudication Respiratory: No cough, sputum production,hemoptysis,  significant snoring,apnea   Gastrointestinal: No heartburn,dysphagia,abdominal pain, nausea / vomiting,rectal bleeding, melena,change in bowels Genitourinary: No dysuria,hematuria, pyuria,  incontinence, nocturia Musculoskeletal: No joint stiffness, joint swelling, weakness,pain Dermatologic: No rash, pruritus, change in appearance of skin Hematologic/lymphatic: No significant bruising, lymphadenopathy,abnormal bleeding Allergy/immunology: No itchy/ watery eyes, significant sneezing, urticaria, angioedema  Physical exam:  Pertinent or positive findings: She appears frail and deconditioned. Hearing is decreased. Breath sounds are decreased but chest  is clear. Rhythm is regular. She has 1/2+ pitting edema. Pedal pulses are decreased. She has scattered minor bruising over the forearms.  General appearance:Adequately nourished; no acute distress , increased work of breathing is present @ rest.   Lymphatic: No lymphadenopathy about the head, neck, axilla . Eyes: No conjunctival inflammation or lid edema is present. There is no scleral icterus. Ears:  External ear exam shows no significant lesions or deformities.   Nose:  External nasal examination shows no deformity or inflammation. Nasal mucosa are pink and moist without lesions ,exudates Oral exam: lips and gums are healthy appearing.There is no oropharyngeal erythema or exudate . Neck:  No thyromegaly, masses, tenderness noted.    Heart:  Normal rate and regular rhythm. S1 and S2 normal without gallop, murmur, click, rub .  Lungs:Chest clear to auscultation without wheezes, rhonchi,rales , rubs. Abdomen:Bowel sounds are normal. Abdomen is soft and nontender with no organomegaly, hernias,masses. GU: deferred  Extremities:  No cyanosis  Skin: Warm & dry w/o tenting. No significant lesions or rash.  #1 edema #2 exertional dyspnea/weakness with exertion most likely related to advanced COPD and superimposed histoplasmosis Plan: Maintain O2 sats over 90% with supplemental oxygen. DuoNeb every 4 hours as needed. HCTZ 12.5 mg daily for edema.

## 2016-09-05 NOTE — Patient Instructions (Signed)
See diagnosis and plan in Epic note.

## 2016-09-15 ENCOUNTER — Ambulatory Visit (HOSPITAL_COMMUNITY)
Admission: RE | Admit: 2016-09-15 | Discharge: 2016-09-15 | Disposition: A | Discharge status: Home / Self Care | Payer: PRIVATE HEALTH INSURANCE | Source: Ambulatory Visit | Attending: Internal Medicine | Admitting: Internal Medicine

## 2016-09-15 DIAGNOSIS — R911 Solitary pulmonary nodule: Secondary | ICD-10-CM | POA: Insufficient documentation

## 2016-09-15 DIAGNOSIS — J90 Pleural effusion, not elsewhere classified: Secondary | ICD-10-CM | POA: Diagnosis not present

## 2016-09-15 DIAGNOSIS — R918 Other nonspecific abnormal finding of lung field: Secondary | ICD-10-CM | POA: Diagnosis not present

## 2016-09-15 DIAGNOSIS — I2510 Atherosclerotic heart disease of native coronary artery without angina pectoris: Secondary | ICD-10-CM | POA: Insufficient documentation

## 2016-09-15 DIAGNOSIS — I700 Atherosclerosis of aorta: Secondary | ICD-10-CM | POA: Insufficient documentation

## 2016-09-15 DIAGNOSIS — J439 Emphysema, unspecified: Secondary | ICD-10-CM | POA: Diagnosis not present

## 2016-09-15 LAB — GLUCOSE, CAPILLARY: GLUCOSE-CAPILLARY: 107 mg/dL — AB (ref 65–99)

## 2016-09-15 MED ORDER — FLUDEOXYGLUCOSE F - 18 (FDG) INJECTION
6.1100 | Freq: Once | INTRAVENOUS | Status: AC | PRN
  Administered 2016-09-15: 6.11 via INTRAVENOUS

## 2016-09-15 NOTE — Discharge Instructions (Signed)
Personal Items   Please collect all clothing which belongs to you from your nurse. Please collect any valuables you stored during your stay from the front desk, and please remember all of your personal items, such as dentures, canes, and eyeglasses.   Activity Instructions   You must avoid lifting more than *** pounds until your physician instructs you differently. You should avoid {d/c avoid/resume:120111}. You may resume {d/c avoid/resume:120111}.   Activity Instructions   You must avoid lifting more than *** pounds until your physician instructs you differently. You should avoid {d/c avoid/resume:120111}. You may resume {d/c avoid/resume:120111}.   Patient Discharge   Jodi Burns / [id number] DOB: 1926-03-08   Admitted 09/15/2016 Discharged: 09/15/2016   Scheduled Meds: Continuous Infusions: PRN Meds:  Personal Items   Please collect all clothing which belongs to you from your nurse. Please collect any valuables you stored during your stay from the front desk, and please remember all of your personal items, such as dentures, canes, and eyeglasses.   Activity Instructions   You must avoid lifting more than *** pounds until your physician instructs you differently. You should avoid {d/c avoid/resume:120111}. You may resume {d/c avoid/resume:120111}.   I understand that if any problems occur once I am at home I am to contact my physician.  I understand and acknowledge receipt of the instructions indicated above.    _____________________________________________                                                       ENIDPOEUM'P or R.N.'s Signature                Date/Time                        _____________________________________________                                                       Patient or Representative Signature         Date/Time        Patient Discharge   Jodi Burns / [id number] DOB: 1926-06-28   Admitted 09/15/2016 Discharged: 09/15/2016   Scheduled  Meds: Continuous Infusions: PRN Meds:  Personal Items   Please collect all clothing which belongs to you from your nurse. Please collect any valuables you stored during your stay from the front desk, and please remember all of your personal items, such as dentures, canes, and eyeglasses.   Activity Instructions   You must avoid lifting more than *** pounds until your physician instructs you differently. You should avoid {d/c avoid/resume:120111}. You may resume {d/c avoid/resume:120111}.   I understand that if any problems occur once I am at home I am to contact my physician.  I understand and acknowledge receipt of the instructions indicated above.    _____________________________________________                                                       Physician's or R.N.'s  Signature                Date/Time                        _____________________________________________                                                       Patient or Electronics engineer

## 2016-09-19 ENCOUNTER — Encounter: Payer: Self-pay | Admitting: Thoracic Surgery (Cardiothoracic Vascular Surgery)

## 2016-09-19 ENCOUNTER — Institutional Professional Consult (permissible substitution) (INDEPENDENT_AMBULATORY_CARE_PROVIDER_SITE_OTHER): Payer: Medicare Other | Admitting: Thoracic Surgery (Cardiothoracic Vascular Surgery)

## 2016-09-19 VITALS — BP 122/63 | HR 74 | Resp 16 | Ht 62.0 in | Wt 122.0 lb

## 2016-09-19 DIAGNOSIS — R918 Other nonspecific abnormal finding of lung field: Secondary | ICD-10-CM

## 2016-09-19 NOTE — Progress Notes (Signed)
PCP is Jerlyn Ly, MD Referring Provider is Crist Infante, MD  Chief Complaint  Patient presents with  . Lung Mass    RULobe and ? LULobe mass .Marland KitchenMarland KitchenCTA 08/20/16, PET 09/15/16    HPI: 83 year old woman sent for consultation regarding right upper lobe lung mass and left upper lobe lung nodule.  Mrs. Jodi Burns is an 83 year old woman with a past medical history significant for COPD, hypertension, histoplasmosis, legally blind, moderate protein calorie malnutrition, and atrial fibrillation. She was admitted to [facility] on Christmas Eve with worsening shortness of breath at rest and with exertion. This had been progressive over 2-3 weeks prior to admission. She was found to be in atrial fibrillation with a rapid ventricular response. She ultimately had cardioversion on 08/29/2016. She was started on Eliquis.  During that admission she had a CT chest which showed a 4 cm spiculated mass in the right upper lobe and 1.4 cm nodule in the left upper lobe. There was some evidence of old granulomatous disease (histoplasmosis), but no suspicious adenopathy. She was discharged to a skilled nursing facility for rehabilitation.  As an outpatient she had a PET/CT which showed the right upper lobe mass was markedly hypermetabolic with an SUV of 34. The left upper lobe nodule is mildly hypermetabolic with an SUV of 1.02.  She has occasional headaches but they're not uniform. She does complain of some pain under her right shoulder blade. This is a vague pain that is not always present. She says she is not currently having any significant coughing or wheezing and does not feel short of breath although her activities are very limited currently. She lost about 7 or 8 pounds ulcer was hospitalized. She sometimes has some mild swelling in her feet.  Zubrod Score: At the time of surgery this patient's most appropriate activity status/level should be described as: '[]'     0    Normal activity, no symptoms '[]'     1     Restricted in physical strenuous activity but ambulatory, able to do out light work '[x]'     2    Ambulatory and capable of self care, unable to do work activities, up and about >50 % of waking hours                              '[]'     3    Only limited self care, in bed greater than 50% of waking hours '[]'     4    Completely disabled, no self care, confined to bed or chair '[]'     5    Moribund    Past Medical History:  Diagnosis Date  . Allergic rhinitis   . Atrial fibrillation ([place])   . Dyspnea on exertion   . 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 Osteopenia   . Other and unspecified hyperlipidemia   . Other retinal disorders(362.89)   . Personal history of other diseases of circulatory system   . Personal history of venous thrombosis and embolism 4/94  . Reactive airway disease   . Subjective visual disturbance, unspecified   . Unspecified asthma(493.90)   . Vitamin D deficiency     Past Surgical History:  Procedure Laterality Date  . APPENDECTOMY    . BREAST BIOPSY    . BREAST BIOPSY     bilateral  . CARDIOVASCULAR STRESS TEST  August 2013   CPET-MET:  Submaximal effort, did not reach anaerobic threshold --> peak VO2 76% (not overly concerning for CAD)  . CARDIOVERSION N/A 08/29/2016   Procedure: CARDIOVERSION;  Surgeon: Sanda Klein, MD;  Location: [place];  Service: Cardiovascular;  Laterality: N/A;  . CARPAL TUNNEL RELEASE  2006   right  . PERINEOPLASTY     and rectocele  . Pulmonary Function Test  August 2013   CPET-PFTs: FVC 58%, FEV1 48%; breathing reserve less than 10%, normal DLCO; reduced vital capacity -- suggesting obstructive lung disease  . ROTATOR CUFF REPAIR  7/04, 2003   right, left  . TEE WITHOUT CARDIOVERSION N/A 08/29/2016   Procedure: TRANSESOPHAGEAL ECHOCARDIOGRAM (TEE);  Surgeon: Sanda Klein, MD;  Location: [facility] ENDOSCOPY;  Service: Cardiovascular;  Laterality: N/A;  . TOTAL ABDOMINAL  HYSTERECTOMY    . TRANSTHORACIC ECHOCARDIOGRAM  February 2011   EF 55-60%. Grade 1 diastolic dysfunction/relaxation abnormality. Mild MR    Family History  Problem Relation Age of Onset  . Osteoporosis Mother   . Diabetes Paternal Aunt     Social History Social History  Substance Use Topics  . Smoking status: Former Smoker    Types: Cigarettes    Quit date: 1969  . Smokeless tobacco: Never Used     Comment: 3/4 ppd 1946-1969 (17.5 pack years)  . Alcohol use Yes     Comment: high ball prior to dinner    Current Outpatient Prescriptions  Medication Sig Dispense Refill  . acetaminophen (TYLENOL) 325 MG tablet Take 650 mg by mouth every 6 (six) hours as needed for mild pain.     Marland Kitchen albuterol (PROAIR HFA) 108 (90 BASE) MCG/ACT inhaler Inhale 2 puffs into the lungs every 6 (six) hours as needed for wheezing or shortness of breath. 1 Inhaler 11  . amiodarone (PACERONE) 200 MG tablet Take 1 tablet (200 mg total) by mouth daily. 30 tablet 0  . apixaban (ELIQUIS) 2.5 MG TABS tablet Take 1 tablet (2.5 mg total) by mouth 2 (two) times daily. 60 tablet 0  . budesonide-formoterol (SYMBICORT) 160-4.5 MCG/ACT inhaler Inhale 2 puffs into the lungs 2 (two) times daily. Rinse mouth 10.2 g 11  . Cholecalciferol (VITAMIN D3) 5000 units TABS Take 2 tablets by mouth daily.    . Coenzyme Q10 (COQ10) 200 MG CAPS Take 1 capsule by mouth once.     . cyanocobalamin 1000 MCG tablet Take 2,000 mcg by mouth daily.    Marland Kitchen diltiazem (CARDIZEM CD) 180 MG 24 hr capsule Take 1 capsule (180 mg total) by mouth daily. 30 capsule 0  . dorzolamide-timolol (COSOPT) 22.3-6.8 MG/ML ophthalmic solution Place 1 drop into both eyes 2 (two) times daily.      . fluocinonide (LIDEX) 0.05 % external solution Apply 1 application topically once a week. scalp  1  . hydrochlorothiazide (MICROZIDE) 12.5 MG capsule Take 12.5 mg by mouth daily.    Marland Kitchen ipratropium (ATROVENT) 0.03 % nasal spray 1-2 puffs each nostril every 8 hours if needed 30  mL 12  . ipratropium-albuterol (DUONEB) 0.5-2.5 (3) MG/3ML SOLN Take 3 mLs by nebulization every 4 (four) hours as needed. 360 mL 0  . loratadine (CLARITIN) 10 MG tablet Take 10 mg by mouth daily as needed for allergies.     . Multiple Vitamins-Minerals (PRESERVISION/LUTEIN) CAPS Take 1 capsule by mouth 2 (two) times daily.       No current facility-administered medications for this visit.     Allergies  Allergen Reactions  . Cefaclor     REACTION: gi upset  .  Codeine Nausea And Vomiting    Review of Systems  Constitutional: Positive for unexpected weight change (Lost 7-8 pounds while in hospital). Negative for appetite change.  HENT: Positive for hearing loss. Negative for trouble swallowing and voice change.   Respiratory: Positive for shortness of breath. Negative for cough and wheezing.   Cardiovascular: Positive for palpitations and leg swelling. Negative for chest pain.  Gastrointestinal: Negative for abdominal pain and blood in stool.  Genitourinary: Negative for difficulty urinating and dysuria.  Musculoskeletal: Positive for back pain and joint swelling.  Neurological: Positive for headaches. Negative for seizures and syncope.  Hematological: Negative for adenopathy. Bruises/bleeds easily.  All other systems reviewed and are negative.   BP 122/63 (BP Location: Right Arm, Patient Position: Sitting, Cuff Size: Normal)   Pulse 74   Resp 16   Ht '5\' 2"'  (1.575 m)   Wt 122 lb (55.3 kg)   LMP 08/29/1967   SpO2 97% Comment: RA  BMI 22.31 kg/m  Physical Exam  Constitutional: She is oriented to person, place, and time. No distress.  Frail appearing  HENT:  Head: Normocephalic and atraumatic.  Mouth/Throat: No oropharyngeal exudate.  Eyes: Conjunctivae and EOM are normal. No scleral icterus.  Neck: Neck supple. No thyromegaly present.  Cardiovascular: Normal rate, regular rhythm and normal heart sounds.   No murmur heard. Pulmonary/Chest: No respiratory distress. She has  no wheezes.  Diminished BS, but equal bilaterally  Abdominal: Soft. She exhibits no distension. There is no tenderness.  Musculoskeletal: She exhibits edema (trace).  Lymphadenopathy:    She has no cervical adenopathy.  Neurological: She is alert and oriented to person, place, and time. No cranial nerve deficit.  No focal motor deficit  Skin: Skin is warm and dry.  Atrophic, multiple ecchymoses  Vitals reviewed.    Diagnostic Tests: CT ANGIOGRAPHY CHEST WITH CONTRAST  TECHNIQUE: Multidetector CT imaging of the chest was performed using the standard protocol during bolus administration of intravenous contrast. Multiplanar CT image reconstructions and MIPs were obtained to evaluate the vascular anatomy.  CONTRAST:  100 cc of Isovue 370  COMPARISON:  Chest radiograph of earlier today.  No prior CT.  FINDINGS: Cardiovascular: The quality of this exam for evaluation of pulmonary embolism is good. No evidence of pulmonary embolism.  Mild cardiomegaly, with LAD coronary artery atherosclerosis. Advanced aortic and branch vessel atherosclerosis. Tortuous thoracic aorta.  Mediastinum/Nodes: No mediastinal or hilar adenopathy.  Lungs/Pleura: No pleural fluid. Lower lobe predominant bronchial wall thickening. Mild centrilobular emphysema.  Right upper lobe lung mass, 3.4 x 4.0 cm on image 24/series 407. 3.9 cm craniocaudal.  4 mm right lower lobe pulmonary nodule on image 92/series 407.  Spiculated left upper lobe density measures 1.2 x 0.8 cm on image 23/series 4 and 7.  Vague 5 mm left lower lobe pulmonary nodule on image 63/series 47.  Upper Abdomen: Old granulomatous disease in the liver and spleen. Normal imaged portions of the stomach, pancreas, gallbladder, adrenal glands, kidneys. Abdominal aortic atherosclerosis.  Musculoskeletal: Left rotator cuff repair.  Review of the MIP images confirms the above findings.  IMPRESSION: 1.  No evidence of  pulmonary embolism. 2. Right upper lobe lung mass, consistent with primary bronchogenic carcinoma. No thoracic adenopathy. 3. Irregular left upper lobe opacity could represent a synchronous primary bronchogenic carcinoma. A focus of infection is felt less likely. 4. Scattered nonspecific smaller pulmonary nodules. 5.  Coronary artery atherosclerosis. Aortic atherosclerosis.   Electronically Signed   By: Abigail Miyamoto M.D.   On:  08/20/2016 16:18 NUCLEAR MEDICINE PET SKULL BASE TO THIGH  TECHNIQUE: 6.11 mCi F-18 FDG was injected intravenously. Full-ring PET imaging was performed from the skull base to thigh after the radiotracer. CT data was obtained and used for attenuation correction and anatomic localization.  FASTING BLOOD GLUCOSE:  Value: 107 mg/dl  COMPARISON:  08/20/2016  FINDINGS: NECK  No hypermetabolic lymph nodes in the neck.  CHEST  The heart size appears normal. Aortic atherosclerosis identified. Calcifications involving the LAD and RCA coronary artery noted. No mediastinal or hilar adenopathy identified.  There is a small right pleural effusion. Overlying airspace consolidation which appears hypermetabolic is identified within the right lower lobe. This is favored to represent pneumonia and or aspiration. Right upper lobe mass measures 4 cm and has an SUV max equal to 34.7. Spiculated density within the scratch set spiculated nodular density in the left upper lobe measures 1.4 cm and has an SUV max equal to 1.65. Mild changes of centrilobular emphysema.  ABDOMEN/PELVIS  No abnormal hypermetabolic activity within the liver, pancreas, adrenal glands, or spleen. Calcified splenic and liver granulomas. No hypermetabolic lymph nodes in the abdomen or pelvis.  SKELETON  No focal hypermetabolic activity to suggest skeletal metastasis.  IMPRESSION: 1. Mass in the right upper lobe measures 4 cm and exhibits intense FDG uptake, suspicious for  primary bronchogenic carcinoma. 2. Small nodule in the left upper lobe exhibits low level malignant range FDG uptake and is worrisome for primary lung neoplasm. 3. No evidence for mediastinal or hilar nodal metastasis or distant metastatic disease. 4. Emphysema 5. Aortic atherosclerosis and coronary artery calcification. 6. Small right pleural effusion. Overlying airspace consolidation within the right lower lobe concerning for pneumonia and/or aspiration. 7. Prior granulomatous disease.   Electronically Signed   By: Kerby Moors M.D.   On: 09/15/2016 10:49 I personally reviewed the CT and PET CT scans and concur with the findings noted above  Impression: Mrs. Jodi Burns is an 83 year old woman who was recently admitted with rapid atrial fibrillation and was found to have a right upper lobe mass and a left upper lobe lung nodule. She has several remote smoking history but quit over 40 years ago. These lesions are both hypermetabolic on PET/CT. The right upper lobe mass is markedly hypermetabolic with SUV of 34. The left upper lobe nodule is much less hypermetabolic but any metabolism is significant given the groundglass appearance of the nodule. The lesions are highly suspicious for synchronous primary bronchogenic carcinomas. Fungal or AFB disease is in the differential, however far less likely. Biopsy would be necessary to prove the diagnosis.  I had a long discussion with Dr. and Mrs. Ocain and their children. We started discussion with the question of whether she would want to have treatment. I do not feel that surgery is an option in her case. That leaves [place] with radiation and/or chemotherapy. I think the right upper lobe lesion is probably not suitable for stereotactic radiation but might be approachable with a hyperfractionated plan. However I would defer that decision to radiation oncologist. The left upper lobe nodule would potentially be amenable to stereotactic radiation. She  probably would not be a candidate for aggressive chemotherapy given her age. However, if a biopsy allowed molecular testing which showed a genetic mutation she might be a candidate for targeted therapy.  We discussed potential ways to biopsy the lesion. One with a CT-guided biopsy of the right lung mass. I think this is feasible but would need to be approved by the  interventional radiologist first. If possible a core needle biopsy would be preferred to allow tissue for molecular testing. An alternative approach would be a navigational bronchoscopy. That would actually allow [place] to biopsy both the right and left sides. However that would require general anesthesia and she is very reluctant to have that.  She would like to talk with the was a radiation oncologist before making a decision as to whether to have a biopsy done, and the family is requesting Dr. Sondra Come.  Plan: I will arrange an appointment with Dr. Sondra Come  Will review her case at our multidisciplinary thoracic oncology conference this week to get input from all specialties as to how best to proceed.  If she opts to have a biopsy her Eliquis will need to be held prior to the procedure  Melrose Nakayama, MD [facility] [phone]

## 2016-09-20 ENCOUNTER — Telehealth: Payer: Self-pay | Admitting: *Deleted

## 2016-09-20 NOTE — Telephone Encounter (Signed)
Oncology Nurse Navigator Documentation  Oncology Nurse Navigator Flowsheets 09/20/2016  Navigator Location [facility]  Referral date to RadOnc/MedOnc 09/20/2016  Navigator Encounter Type Telephone/I received referral on Jodi Burns today.  I called and spoke with her husband and gave him the appt for [place] on 09/28/16.  He verbalized understanding of appt time and place.   Telephone Outgoing Call  Treatment Phase Pre-Tx/Tx Discussion  Barriers/Navigation Needs Coordination of Care  Interventions Coordination of Care  Coordination of Care Appts  Acuity Level 1  Acuity Level 1 Initial guidance, education and coordination as needed  Time Spent with Patient 30

## 2016-09-21 ENCOUNTER — Telehealth: Payer: Self-pay | Admitting: *Deleted

## 2016-09-21 NOTE — Telephone Encounter (Signed)
Mailed MTOC packet to pt.  

## 2016-09-22 ENCOUNTER — Encounter: Payer: Self-pay | Admitting: Internal Medicine

## 2016-09-22 ENCOUNTER — Non-Acute Institutional Stay (SKILLED_NURSING_FACILITY): Payer: Medicare Other | Admitting: Internal Medicine

## 2016-09-22 DIAGNOSIS — H6121 Impacted cerumen, right ear: Secondary | ICD-10-CM | POA: Diagnosis not present

## 2016-09-22 DIAGNOSIS — I951 Orthostatic hypotension: Secondary | ICD-10-CM | POA: Diagnosis not present

## 2016-09-22 NOTE — Patient Instructions (Signed)
See Plan & orders

## 2016-09-22 NOTE — Progress Notes (Signed)
    This is a nursing facility follow up for specific acute issue of earache.  Interim medical record and care since last [place] visit was updated with review of diagnostic studies and change in clinical status since last visit were documented.  HPI: She describes an earache on the right for several days. It is worse in the morning and seems to improve through the day. She states she feels as if she is "talking in a well". Some decreased hearing is associated with the discomfort. She also has an intermittent nonproductive cough which is not significant. She has no other constitutional or upper respiratory tract symptoms. 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 She remains "weak" but is improved.  Review of systems: Constitutional: No fever,significant weight change  Eyes: No redness, discharge, pain, vision change ENT/mouth: No nasal congestion,  purulent discharge, sore throat  Cardiovascular: No chest pain, palpitations,paroxysmal nocturnal dyspnea, claudication, edema  Respiratory: No sputum production,hemoptysis Allergy/immunology: No itchy/ watery eyes, significant sneezing, urticaria, angioedema  Physical exam:  Pertinent or positive findings: She appears somewhat frail she has wax in both ears with an impaction suggested on the right. There was no discomfort with mastication maneuvers while palpating the right temporal mandibular joint. Vision is markedly decreased but she can count fingers @ 3 feet. Heart sounds are distant.  General appearance:Adequately nourished; no acute distress , increased work of breathing is present.   Lymphatic: No lymphadenopathy about the head, neck, axilla . Eyes: No conjunctival inflammation or lid edema is present. There is no scleral icterus.EOMI Ears:  External ear exam shows no significant lesions or deformities.   Nose:  External nasal examination shows no deformity or  inflammation. Nasal mucosa are pink and moist without lesions ,exudates Oral exam: lips and gums are healthy appearing.There is no oropharyngeal erythema or exudate . Neck:  No thyromegaly, masses, tenderness noted.    Heart:  Normal rate and regular rhythm. S1 and S2 normal without gallop, murmur, click, rub .  Lungs:Chest clear to auscultation without wheezes, rhonchi,rales , rubs. Extremities:  No cyanosis, clubbing,edema  Skin: Warm & dry w/o tenting. No significant lesions or rash.   #1 right earache due to cerumen impaction #2 some postural hypotension symptoms  Debrox will be initiated until she is able to see her PCP. Aggressive gavage could exacerbate her lightheadedness and weakness. Isometric exercises  prior to standing were discussed.

## 2016-09-25 ENCOUNTER — Telehealth: Payer: Self-pay | Admitting: *Deleted

## 2016-09-25 NOTE — Telephone Encounter (Signed)
Oncology Nurse Navigator Documentation  Oncology Nurse Navigator Flowsheets 09/20/2016  Navigator Location [facility]  Referral date to RadOnc/MedOnc 09/20/2016  Navigator Encounter Type Telephone/[name] called today.  He had questions about where the appt for [place] was. I explained. He was thankful for the help.   Telephone Outgoing Call  Treatment Phase Pre-Tx/Tx Discussion  Barriers/Navigation Needs Coordination of Care  Interventions Coordination of Care  Coordination of Care Appts  Acuity Level 1  Acuity Level 1 Initial guidance, education and coordination as needed  Time Spent with Patient 30

## 2016-09-28 ENCOUNTER — Encounter: Payer: Self-pay | Admitting: Internal Medicine

## 2016-09-28 ENCOUNTER — Non-Acute Institutional Stay (SKILLED_NURSING_FACILITY): Payer: Medicare Other | Admitting: Internal Medicine

## 2016-09-28 ENCOUNTER — Ambulatory Visit
Admission: RE | Admit: 2016-09-28 | Discharge: 2016-09-28 | Disposition: A | Discharge status: Home / Self Care | Payer: Medicare Other | Source: Ambulatory Visit | Attending: Radiation Oncology | Admitting: Radiation Oncology

## 2016-09-28 ENCOUNTER — Encounter: Payer: Self-pay | Admitting: *Deleted

## 2016-09-28 VITALS — BP 96/52 | HR 77 | Temp 98.1°F | Resp 17 | Wt 119.4 lb

## 2016-09-28 DIAGNOSIS — R918 Other nonspecific abnormal finding of lung field: Secondary | ICD-10-CM

## 2016-09-28 DIAGNOSIS — I4891 Unspecified atrial fibrillation: Secondary | ICD-10-CM | POA: Diagnosis not present

## 2016-09-28 DIAGNOSIS — J449 Chronic obstructive pulmonary disease, unspecified: Secondary | ICD-10-CM | POA: Diagnosis not present

## 2016-09-28 DIAGNOSIS — Z87891 Personal history of nicotine dependence: Secondary | ICD-10-CM | POA: Diagnosis not present

## 2016-09-28 DIAGNOSIS — C3412 Malignant neoplasm of upper lobe, left bronchus or lung: Secondary | ICD-10-CM | POA: Diagnosis not present

## 2016-09-28 DIAGNOSIS — C3411 Malignant neoplasm of upper lobe, right bronchus or lung: Secondary | ICD-10-CM | POA: Diagnosis not present

## 2016-09-28 NOTE — Assessment & Plan Note (Signed)
Seeing Dr.Kinard, Lung Clinic CT guided biopsy of right upper lobe mass deferred to him

## 2016-09-28 NOTE — Progress Notes (Signed)
[facility] Room: 213 Full Code  PCP Jerlyn Ly, MD [place] [address]  The patient is being discharged from [facility] 09/29/16 by Unice Cobble MD.   The medical history in this facility was reviewed and summarized and medical problem list was updated. Time spent and note content is documented as follows.  Summary of Jodi Burns medical records: She had been hospitalized 08/20/16-08/31/16 for progressive dyspnea even at rest .A fibrillation with rapid ventricular responded to IV Cardizem, but this caused hypotension. She was transitioned to oral diltiazem Eliquis 2.5 mg bid was initiated. Amiodarone was initiated at 200 mg twice a day ; 1/15 it was reduced to 200 mg daily. An incidental finding was a lung nodule. PET scan revealed intense FDG uptake in the right upper lobe mass measuring 4 cm. This is suspicious for primary bronchogenic carcinoma. There was also a small nodule in the left upper lobe with low level malignant range FDG uptake. There was no evidence of mediastinal or hilar nodal metastases or distal metastatic disease. Severe emphysema was present. She had evidence of prior granulomatous disease. 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  Dr. Roxan Hockey, CVTS informed the patient & extended family that surgery is not an option. Sterotactic radiation was deferred to Dr. Sondra Come. CT guided biopsy of the right lung mass was to be considered.She was evaluated by Gery Pray MD @ the [facility] today The patient entered the SNF for rehabilitation. She has progressed to the point she can walk 75 feet; but she is limited by weakness in her knees rather than shortness of breath per se. She describes being tired and having a poor appetite.   Review of systems:Pertinent or active symptoms as above Negative GDJ:[id number]: No fever Eyes: No redness,  discharge, pain, vision change ENT/mouth: No nasal congestion,  purulent discharge, earache,change in hearing ,sore throat  Cardiovascular: No chest pain, palpitations,paroxysmal nocturnal dyspnea, claudication, edema  Respiratory: No cough, sputum production,hemoptysis,  significant snoring,apnea   Gastrointestinal: No heartburn,dysphagia,abdominal pain, nausea / vomiting,rectal bleeding, melena,change in bowels Genitourinary: No dysuria,hematuria, pyuria,  incontinence, nocturia Musculoskeletal: No joint stiffness, joint swelling, pain Dermatologic: No rash, pruritus, change in appearance of skin Neurologic: No dizziness,headache,syncope, seizures, numbness , tingling Psychiatric: No significant anxiety , depression, insomnia Endocrine: No change in hair/skin/ nails, excessive thirst, excessive hunger, excessive urination  Hematologic/lymphatic: No significant bruising, lymphadenopathy,abnormal bleeding Allergy/immunology: No itchy/ watery eyes, significant sneezing, urticaria, angioedema  Physical exam:  Pertinent or positive findings: She is thin but adequately nourished. There is decreased auditory acuity. Lacrimal glands are prominent. Breath  sounds are decreased but the chest is clear. She has symmetric weakness particularly in the lower extremities. Trace edema is present. Clubbing is suggested of the nailbeds. Pedal pulses are decreased. She has symmetric extremity weakness.  General appearance: no acute distress , increased work of breathing is present.   Lymphatic: No lymphadenopathy about the head, neck, axilla . Eyes: No conjunctival inflammation or lid edema is present. There is no scleral icterus. Ears:  External ear exam shows no significant lesions or deformities.   Nose:  External nasal examination shows no deformity or inflammation. Nasal mucosa are pink and moist without lesions ,exudates Oral exam: lips and gums are healthy appearing.There is no oropharyngeal erythema or  exudate . Neck:  No thyromegaly, masses, tenderness noted.    Heart:  Normal rate and regular rhythm. S1 and S2  normal without gallop, murmur, click, rub .  Lungs:Chest clear to auscultation without wheezes, rhonchi,rales , rubs. Abdomen:Bowel sounds are normal. Abdomen is soft and nontender with no organomegaly, hernias,masses. GU: deferred . Extremities:  No cyanosis  Deep tendon reflexes are equal Skin: Warm & dry w/o tenting. No significant lesions or rash.  See clinical summary of Discharge Diagnoses in the Problem List with associated updated therapeutic plan  Discharge instructions were written and discharge instructions provided. Follow-up will be by the primary care physician in seven - 14 days.

## 2016-09-28 NOTE — Assessment & Plan Note (Signed)
09/28/16 clinically resolved with good rate control. Calcium channel blocker, Amiodarone, and Eliquis will be continued.  Eliquis would need to be discontinued if CT lung biopsy pursued.

## 2016-09-28 NOTE — Assessment & Plan Note (Signed)
Exertional dyspnea has improved significantly following involvement with PT/OT  oxygen will be provided as needed @ home

## 2016-09-28 NOTE — Progress Notes (Signed)
Radiation Oncology         [phone] ________________________________  Initial Outpatient Consultation  Name: Jodi Burns MRN: [id number]  Date: 09/28/2016  DOB: 04/23/1926  CC:PERINI,MARK A, MD  Hendrickson, Steven C, *   REFERRING PHYSICIAN: Hendrickson, Steven C, *  DIAGNOSIS:  Right upper lobe lung mass and left upper lobe lung nodule  HISTORY OF PRESENT ILLNESS::Jodi Burns is a 83 y.o. female who had a 2-3 week history of progressive shortness of breath. She was admitted to [facility] on 08/20/16. While in the hospital, she had a chest CT. This revealed a 4 cm spiculated mass in the right upper lobe with evidence of old granulomatous disease. She was discharged to a nursing facility for rehabilitation. She underwent a PET/CT on 09/15/16. This revealed the right upper lobe mass as hypermetabolic with an SUV of 34, and the left upper lobe nodule as mildly hypermetabolic with an SUV of 1.65. While in the hospital patient was found to have atrial fibrillation and ultimately underwent cardioversion for this issue. She was started on Eliquis. In light of her performance status since discharge she has been in a rehabilitation facility but expects go home over the weekend.  Patient denies hemoptysis. She reports she is legally blind. She also notes she is hard of hearing. She notes a poor appetite. She is accompanied by her husband a retired physician here in [place] and her son. Phone communication during evaluation with the patient's daughter who lives in the [place] area.  She has occasional headaches but they're not uniform. She does complain of some pain under her right shoulder blade. This is a vague pain that is not always present. She says she is not currently having any significant coughing or wheezing and does not feel short of breath although her activities are very limited currently. She lost about 7 or 8 pounds while she was hospitalized. She sometimes has some mild  swelling in her feet.  PREVIOUS RADIATION THERAPY: No  PAST MEDICAL HISTORY:  has a past medical history of Allergic rhinitis; Atrial fibrillation (HCC); Dyspnea on exertion; History of histoplasmosis; Osteopenia; Other and unspecified hyperlipidemia; Other retinal disorders(362.89); Personal history of other diseases of circulatory system; Personal history of venous thrombosis and embolism (4/94); Reactive airway disease; Subjective visual disturbance, unspecified; Unspecified asthma(493.90); and Vitamin D deficiency.    PAST SURGICAL HISTORY: Past Surgical History:  Procedure Laterality Date  . APPENDECTOMY    . BREAST BIOPSY    . BREAST BIOPSY     bilateral  . CARDIOVASCULAR STRESS TEST  August 2013   CPET-MET: Submaximal effort, did not reach anaerobic threshold --> peak VO2 76% (not overly concerning for CAD)  . CARDIOVERSION N/A 08/29/2016   Procedure: CARDIOVERSION;  Surgeon: Mihai Croitoru, MD;  Location: [facility];  Service: Cardiovascular;  Laterality: N/A;  . CARPAL TUNNEL RELEASE  2006   right  . PERINEOPLASTY     and rectocele  . Pulmonary Function Test  August 2013   CPET-PFTs: FVC 58%, FEV1 48%; breathing reserve less than 10%, normal DLCO; reduced vital capacity -- suggesting obstructive lung disease  . ROTATOR CUFF REPAIR  7/04, 2003   right, left  . TEE WITHOUT CARDIOVERSION N/A 08/29/2016   Procedure: TRANSESOPHAGEAL ECHOCARDIOGRAM (TEE);  Surgeon: Mihai Croitoru, MD;  Location: [facility] END[facility];  Service: Cardiovascular;  Laterality: N/A;  . TOTAL ABDOMINAL HYSTERECTOMY    . TRANSTHORACIC ECHOCARDIOGRAM  February 2011   EF 55-60%. Grade 1 diastolic dysfunction/relaxation abnormality. Mild MR      FAMILY HISTORY: family history includes Diabetes in her paternal aunt; Osteoporosis in her mother.  SOCIAL HISTORY:  reports that she quit smoking about 49 years ago. Her smoking use included Cigarettes. She has never used smokeless tobacco. She reports that she drinks alcohol.  She reports that she does not use drugs.  ALLERGIES: Cefaclor and Codeine  MEDICATIONS:  Current Outpatient Prescriptions  Medication Sig Dispense Refill  . acetaminophen (TYLENOL) 325 MG tablet Take 650 mg by mouth every 6 (six) hours as needed for mild pain.     Marland Kitchen amiodarone (PACERONE) 200 MG tablet Take 1 tablet (200 mg total) by mouth daily. 30 tablet 0  . apixaban (ELIQUIS) 2.5 MG TABS tablet Take 1 tablet (2.5 mg total) by mouth 2 (two) times daily. 60 tablet 0  . budesonide-formoterol (SYMBICORT) 160-4.5 MCG/ACT inhaler Inhale 2 puffs into the lungs 2 (two) times daily. Rinse mouth 10.2 g 11  . Cholecalciferol (VITAMIN D3) 5000 units TABS Take 2 tablets by mouth daily.    . Coenzyme Q10 (COQ10) 200 MG CAPS Take 1 capsule by mouth once.     . cyanocobalamin 1000 MCG tablet Take 2,000 mcg by mouth daily.    Marland Kitchen diltiazem (CARDIZEM SR) 60 MG 12 hr capsule Take 60 mg by mouth 2 (two) times daily.    . dorzolamide-timolol (COSOPT) 22.3-6.8 MG/ML ophthalmic solution Place 1 drop into both eyes 2 (two) times daily.      . fluocinonide (LIDEX) 0.05 % external solution Apply 1 application topically once a week. scalp  1  . ipratropium (ATROVENT) 0.03 % nasal spray 1-2 puffs each nostril every 8 hours if needed 30 mL 12  . ipratropium-albuterol (DUONEB) 0.5-2.5 (3) MG/3ML SOLN Take 3 mLs by nebulization every 4 (four) hours as needed. (Patient taking differently: Take 3 mLs by nebulization every 6 (six) hours as needed. ) 360 mL 0  . loratadine (CLARITIN) 10 MG tablet Take 10 mg by mouth daily as needed for allergies.     . Multiple Vitamins-Minerals (PRESERVISION/LUTEIN) CAPS Take 1 capsule by mouth 2 (two) times daily.      . OXYGEN 2LPM    . traZODone (DESYREL) 50 MG tablet Take 50 mg by mouth 2 (two) times daily as needed for sleep.     No current facility-administered medications for this encounter.     REVIEW OF SYSTEMS:  A 15 point review of systems is documented in the electronic  medical record. This was obtained by the nursing staff. However, I reviewed this with the patient to discuss relevant findings and make appropriate changes.    PHYSICAL EXAM:  weight is 119 lb 6.4 oz (54.2 kg). Her temperature is 98.1 F (36.7 C). Her blood pressure is 96/52 (abnormal) and her pulse is 77. Her respiration is 17 and oxygen saturation is 96%.   Alert and oriented, quite pleasant, somewhat hard of hearing but appears to understand conversation. Patient presents in a wheelchair. Lungs are clear to auscultation bilaterally. Heart has regular rate and rhythm. No palpable cervical, supraclavicular, or axillary adenopathy. Abdomen soft, non-tender, normal bowel sounds.  ECOG = 2  LABORATORY DATA:  Lab Results  Component Value Date   WBC 10.6 (H) 08/31/2016   HGB 12.3 08/31/2016   HCT 37.9 08/31/2016   MCV 94.5 08/31/2016   PLT 349 08/31/2016   Lab Results  Component Value Date   NA 137 08/31/2016   K 3.2 (L) 08/31/2016   CL 101 08/31/2016   CO2 28 08/31/2016  GLUCOSE 113 (H) 08/31/2016   CREATININE 0.52 08/31/2016   CALCIUM 8.8 (L) 08/31/2016      RADIOGRAPHY: Nm Pet Image Initial (pi) Skull Base To Thigh  Result Date: 09/15/2016 CLINICAL DATA:  Initial treatment strategy for lung mass. EXAM: NUCLEAR MEDICINE PET SKULL BASE TO THIGH TECHNIQUE: 6.11 mCi F-18 FDG was injected intravenously. Full-ring PET imaging was performed from the skull base to thigh after the radiotracer. CT data was obtained and used for attenuation correction and anatomic localization. FASTING BLOOD GLUCOSE:  Value: 107 mg/dl COMPARISON:  08/20/2016 FINDINGS: NECK No hypermetabolic lymph nodes in the neck. CHEST The heart size appears normal. Aortic atherosclerosis identified. Calcifications involving the LAD and RCA coronary artery noted. No mediastinal or hilar adenopathy identified. There is a small right pleural effusion. Overlying airspace consolidation which appears hypermetabolic is identified  within the right lower lobe. This is favored to represent pneumonia and or aspiration. Right upper lobe mass measures 4 cm and has an SUV max equal to 34.7. Spiculated density within the scratch set spiculated nodular density in the left upper lobe measures 1.4 cm and has an SUV max equal to 1.65. Mild changes of centrilobular emphysema. ABDOMEN/PELVIS No abnormal hypermetabolic activity within the liver, pancreas, adrenal glands, or spleen. Calcified splenic and liver granulomas. No hypermetabolic lymph nodes in the abdomen or pelvis. SKELETON No focal hypermetabolic activity to suggest skeletal metastasis. IMPRESSION: 1. Mass in the right upper lobe measures 4 cm and exhibits intense FDG uptake, suspicious for primary bronchogenic carcinoma. 2. Small nodule in the left upper lobe exhibits low level malignant range FDG uptake and is worrisome for primary lung neoplasm. 3. No evidence for mediastinal or hilar nodal metastasis or distant metastatic disease. 4. Emphysema 5. Aortic atherosclerosis and coronary artery calcification. 6. Small right pleural effusion. Overlying airspace consolidation within the right lower lobe concerning for pneumonia and/or aspiration. 7. Prior granulomatous disease. Electronically Signed   By: Taylor  Stroud M.D.   On: 09/15/2016 10:49      IMPRESSION: Right upper lobe lung mass and left upper lobe lung nodule. The patient would be a candidate for stereotactic radiosurgery directed at her left lung lesion. She would also be a candidate for hypofractionation accelerated radiation treatment directed at the right lung mass.  The size of the right upper lobe lesion would likely not allow SBRT. I discussed the pros and cons of the treatment to both of these areas with the patient and her family. After careful consideration, she wishes to proceed with treatment. She does wish to have a biopsy of the larger lesion and we will set up a CT-guided biopsy of this area in the near future. A core  biopsy will be performed so that enough tissue can be sent for molecular testing.  PLAN: Patient will proceed with planning after her biopsy is complete. The patient will likely receive 10 treatments for her right upper lung mass and 3 treatments directed at the left  upper lung lesion.     ------------------------------------------------  James D. Kinard, PhD, MD   This document serves as a record of services personally performed by James Kinard, MD. It was created on his behalf by Leslie Hoyt, a trained medical scribe. The creation of this record is based on the scribe's personal observations and the provider's statements to them. This document has been checked and approved by the attending provider.  

## 2016-09-28 NOTE — Patient Instructions (Signed)
See assessment and plan under each diagnosis in the problem list and acutely for this visit Prescriptions sent to her pharmacy( [place], [facility]) for Eliquis 2.5 mg twice a day, dispense 60; amiodarone 200 mg once daily, dispense 30; Diltiazem 60 mg one every 12 hours, dispense 60.

## 2016-09-29 ENCOUNTER — Encounter: Payer: Self-pay | Admitting: *Deleted

## 2016-09-29 NOTE — Progress Notes (Signed)
Oncology Nurse Navigator Documentation  Oncology Nurse Navigator Flowsheets 09/29/2016  Navigator Location [facility]  Navigator Encounter Type Clinic/MDC/I met Dr. And Ms. Champlain yesterday at [facility].  Gave and explained next steps with CT biopsy.  I contacted authorization coordinator to see about insurance authorization.  IR notified me today and asked about her blood thinner and when to hold.  I replied in cancer conference discussion, PA for IR stated they contacted ordering physcian about when to hold.  IR contacted Dr. Hendrickson on when to hold.  Patient is scheduled for CT Biopsy. I will updated Dr. Kinard.   Abnormal Finding Date 08/20/2016  Multidisiplinary Clinic Date 09/28/2016  Patient Visit Type MedOnc  Treatment Phase Abnormal Scans  Barriers/Navigation Needs Coordination of Care  Interventions Coordination of Care  Coordination of Care Appts  Acuity Level 2  Acuity Level 2 Assistance expediting appointments;Educational needs  Time Spent with Patient 45   

## 2016-10-02 ENCOUNTER — Other Ambulatory Visit: Payer: Self-pay | Admitting: Student

## 2016-10-03 ENCOUNTER — Ambulatory Visit (HOSPITAL_COMMUNITY)
Admission: RE | Admit: 2016-10-03 | Discharge: 2016-10-03 | Disposition: A | Discharge status: Home / Self Care | Payer: Medicare Other | Source: Ambulatory Visit | Attending: Radiation Oncology | Admitting: Radiation Oncology

## 2016-10-03 ENCOUNTER — Encounter (HOSPITAL_COMMUNITY): Payer: Self-pay

## 2016-10-03 ENCOUNTER — Other Ambulatory Visit (HOSPITAL_COMMUNITY): Payer: Self-pay | Admitting: Interventional Radiology

## 2016-10-03 ENCOUNTER — Inpatient Hospital Stay (HOSPITAL_COMMUNITY)
Admission: RE | Admit: 2016-10-03 | Discharge: 2016-10-05 | DRG: 181 | Disposition: A | Discharge status: Home Health | Payer: Medicare Other | Source: Ambulatory Visit | Attending: Interventional Radiology | Admitting: Interventional Radiology

## 2016-10-03 ENCOUNTER — Observation Stay (HOSPITAL_COMMUNITY): Payer: Medicare Other

## 2016-10-03 DIAGNOSIS — J95811 Postprocedural pneumothorax: Secondary | ICD-10-CM | POA: Diagnosis present

## 2016-10-03 DIAGNOSIS — R0602 Shortness of breath: Secondary | ICD-10-CM | POA: Diagnosis not present

## 2016-10-03 DIAGNOSIS — Z7952 Long term (current) use of systemic steroids: Secondary | ICD-10-CM

## 2016-10-03 DIAGNOSIS — Z9689 Presence of other specified functional implants: Secondary | ICD-10-CM

## 2016-10-03 DIAGNOSIS — I480 Paroxysmal atrial fibrillation: Secondary | ICD-10-CM | POA: Diagnosis not present

## 2016-10-03 DIAGNOSIS — E785 Hyperlipidemia, unspecified: Secondary | ICD-10-CM | POA: Diagnosis present

## 2016-10-03 DIAGNOSIS — Z978 Presence of other specified devices: Secondary | ICD-10-CM | POA: Diagnosis not present

## 2016-10-03 DIAGNOSIS — C3411 Malignant neoplasm of upper lobe, right bronchus or lung: Secondary | ICD-10-CM | POA: Diagnosis not present

## 2016-10-03 DIAGNOSIS — Z7901 Long term (current) use of anticoagulants: Secondary | ICD-10-CM

## 2016-10-03 DIAGNOSIS — J45909 Unspecified asthma, uncomplicated: Secondary | ICD-10-CM

## 2016-10-03 DIAGNOSIS — I700 Atherosclerosis of aorta: Secondary | ICD-10-CM | POA: Diagnosis present

## 2016-10-03 DIAGNOSIS — I2510 Atherosclerotic heart disease of native coronary artery without angina pectoris: Secondary | ICD-10-CM | POA: Diagnosis present

## 2016-10-03 DIAGNOSIS — R918 Other nonspecific abnormal finding of lung field: Secondary | ICD-10-CM | POA: Diagnosis present

## 2016-10-03 DIAGNOSIS — J439 Emphysema, unspecified: Secondary | ICD-10-CM | POA: Diagnosis present

## 2016-10-03 DIAGNOSIS — R911 Solitary pulmonary nodule: Secondary | ICD-10-CM | POA: Diagnosis present

## 2016-10-03 DIAGNOSIS — I10 Essential (primary) hypertension: Secondary | ICD-10-CM | POA: Diagnosis present

## 2016-10-03 DIAGNOSIS — J449 Chronic obstructive pulmonary disease, unspecified: Secondary | ICD-10-CM | POA: Diagnosis not present

## 2016-10-03 DIAGNOSIS — Z9889 Other specified postprocedural states: Secondary | ICD-10-CM

## 2016-10-03 DIAGNOSIS — J90 Pleural effusion, not elsewhere classified: Secondary | ICD-10-CM | POA: Diagnosis not present

## 2016-10-03 DIAGNOSIS — J939 Pneumothorax, unspecified: Secondary | ICD-10-CM | POA: Diagnosis not present

## 2016-10-03 DIAGNOSIS — E559 Vitamin D deficiency, unspecified: Secondary | ICD-10-CM | POA: Diagnosis present

## 2016-10-03 DIAGNOSIS — Z4682 Encounter for fitting and adjustment of non-vascular catheter: Secondary | ICD-10-CM | POA: Diagnosis not present

## 2016-10-03 DIAGNOSIS — M8580 Other specified disorders of bone density and structure, unspecified site: Secondary | ICD-10-CM | POA: Diagnosis present

## 2016-10-03 DIAGNOSIS — H548 Legal blindness, as defined in USA: Secondary | ICD-10-CM | POA: Diagnosis present

## 2016-10-03 DIAGNOSIS — Z79899 Other long term (current) drug therapy: Secondary | ICD-10-CM

## 2016-10-03 DIAGNOSIS — Z87891 Personal history of nicotine dependence: Secondary | ICD-10-CM

## 2016-10-03 HISTORY — DX: Unspecified osteoarthritis, unspecified site: M19.90

## 2016-10-03 HISTORY — DX: Unspecified asthma, uncomplicated: J45.909

## 2016-10-03 HISTORY — DX: Low back pain, unspecified: M54.50

## 2016-10-03 HISTORY — DX: Personal history of other medical treatment: Z92.89

## 2016-10-03 HISTORY — DX: Dependence on supplemental oxygen: Z99.81

## 2016-10-03 HISTORY — DX: Low back pain: M54.5

## 2016-10-03 HISTORY — DX: Other chronic pain: G89.29

## 2016-10-03 LAB — CBC
HCT: 42.5 % (ref 36.0–46.0)
Hemoglobin: 13.3 g/dL (ref 12.0–15.0)
MCH: 29.8 pg (ref 26.0–34.0)
MCHC: 31.3 g/dL (ref 30.0–36.0)
MCV: 95.3 fL (ref 78.0–100.0)
PLATELETS: 275 10*3/uL (ref 150–400)
RBC: 4.46 MIL/uL (ref 3.87–5.11)
RDW: 15.5 % (ref 11.5–15.5)
WBC: 8.9 10*3/uL (ref 4.0–10.5)

## 2016-10-03 LAB — APTT: aPTT: 28 seconds (ref 24–36)

## 2016-10-03 LAB — PROTIME-INR
INR: 1.02
PROTHROMBIN TIME: 13.4 s (ref 11.4–15.2)

## 2016-10-03 MED ORDER — DORZOLAMIDE HCL-TIMOLOL MAL 2-0.5 % OP SOLN
1.0000 [drp] | Freq: Two times a day (BID) | OPHTHALMIC | Status: DC
  Administered 2016-10-04 – 2016-10-05 (×4): 1 [drp] via OPHTHALMIC
  Filled 2016-10-03: qty 10

## 2016-10-03 MED ORDER — VITAMIN D3 125 MCG (5000 UT) PO TABS: 2.0000 | ORAL_TABLET | Freq: Every day | ORAL | Status: DC

## 2016-10-03 MED ORDER — MIDAZOLAM HCL 2 MG/2ML IJ SOLN
INTRAMUSCULAR | Status: AC | PRN
  Administered 2016-10-03 (×2): 0.5 mg via INTRAVENOUS

## 2016-10-03 MED ORDER — IPRATROPIUM BROMIDE 0.03 % NA SOLN: 1.0000 | Freq: Three times a day (TID) | NASAL | Status: DC

## 2016-10-03 MED ORDER — DILTIAZEM HCL ER 60 MG PO CP12
60.0000 mg | ORAL_CAPSULE | Freq: Two times a day (BID) | ORAL | Status: DC
  Administered 2016-10-04 – 2016-10-05 (×4): 60 mg via ORAL
  Filled 2016-10-03 (×5): qty 1

## 2016-10-03 MED ORDER — MOMETASONE FURO-FORMOTEROL FUM 200-5 MCG/ACT IN AERO: 2.0000 | INHALATION_SPRAY | Freq: Two times a day (BID) | RESPIRATORY_TRACT | Status: DC

## 2016-10-03 MED ORDER — DORZOLAMIDE HCL-TIMOLOL MAL 2-0.5 % OP SOLN
1.0000 [drp] | Freq: Two times a day (BID) | OPHTHALMIC | Status: DC
Start: 2016-10-03 — End: 2016-10-03

## 2016-10-03 MED ORDER — DILTIAZEM HCL ER 60 MG PO CP12: 60.0000 mg | ORAL_CAPSULE | Freq: Two times a day (BID) | ORAL | Status: DC

## 2016-10-03 MED ORDER — IPRATROPIUM-ALBUTEROL 0.5-2.5 (3) MG/3ML IN SOLN: 3.0000 mL | Freq: Four times a day (QID) | RESPIRATORY_TRACT | Status: DC | PRN

## 2016-10-03 MED ORDER — HYDRALAZINE HCL 20 MG/ML IJ SOLN: 5.0000 mg | INTRAMUSCULAR | Status: DC | PRN

## 2016-10-03 MED ORDER — LIDOCAINE HCL 1 % IJ SOLN
INTRAMUSCULAR | Status: AC
  Filled 2016-10-03: qty 20

## 2016-10-03 MED ORDER — AMIODARONE HCL 200 MG PO TABS: 200.0000 mg | ORAL_TABLET | Freq: Every day | ORAL | Status: DC

## 2016-10-03 MED ORDER — VITAMIN B-12 1000 MCG PO TABS: 2000.0000 ug | ORAL_TABLET | Freq: Every day | ORAL | Status: DC

## 2016-10-03 MED ORDER — ACETAMINOPHEN 325 MG PO TABS
650.0000 mg | ORAL_TABLET | Freq: Four times a day (QID) | ORAL | Status: DC | PRN
  Administered 2016-10-03 – 2016-10-05 (×6): 650 mg via ORAL
  Filled 2016-10-03 (×6): qty 2

## 2016-10-03 MED ORDER — FENTANYL CITRATE (PF) 100 MCG/2ML IJ SOLN
INTRAMUSCULAR | Status: AC | PRN
  Administered 2016-10-03: 25 ug via INTRAVENOUS

## 2016-10-03 MED ORDER — KETOROLAC TROMETHAMINE 15 MG/ML IJ SOLN: 15.0000 mg | Freq: Four times a day (QID) | INTRAMUSCULAR | Status: DC | PRN

## 2016-10-03 MED ORDER — FENTANYL CITRATE (PF) 100 MCG/2ML IJ SOLN
INTRAMUSCULAR | Status: AC
  Filled 2016-10-03: qty 4

## 2016-10-03 MED ORDER — IPRATROPIUM BROMIDE 0.03 % NA SOLN
1.0000 | Freq: Three times a day (TID) | NASAL | Status: DC
  Filled 2016-10-03: qty 30

## 2016-10-03 MED ORDER — COQ10 200 MG PO CAPS: 1.0000 | ORAL_CAPSULE | Freq: Every day | ORAL | Status: DC

## 2016-10-03 MED ORDER — ACETAMINOPHEN 325 MG PO TABS
ORAL_TABLET | ORAL | Status: AC
  Filled 2016-10-03: qty 2

## 2016-10-03 MED ORDER — FENTANYL CITRATE (PF) 100 MCG/2ML IJ SOLN
INTRAMUSCULAR | Status: AC | PRN
  Administered 2016-10-03 (×2): 25 ug via INTRAVENOUS

## 2016-10-03 MED ORDER — ACETAMINOPHEN 325 MG PO TABS
650.0000 mg | ORAL_TABLET | Freq: Four times a day (QID) | ORAL | Status: DC | PRN
  Administered 2016-10-03: 650 mg via ORAL

## 2016-10-03 MED ORDER — MIDAZOLAM HCL 2 MG/2ML IJ SOLN
INTRAMUSCULAR | Status: AC
  Filled 2016-10-03: qty 4

## 2016-10-03 MED ORDER — MOMETASONE FURO-FORMOTEROL FUM 200-5 MCG/ACT IN AERO
2.0000 | INHALATION_SPRAY | Freq: Two times a day (BID) | RESPIRATORY_TRACT | Status: DC
Start: 2016-10-03 — End: 2016-10-05
  Administered 2016-10-04 – 2016-10-05 (×2): 2 via RESPIRATORY_TRACT
  Filled 2016-10-03 (×2): qty 8.8

## 2016-10-03 MED ORDER — MIDAZOLAM HCL 2 MG/2ML IJ SOLN
INTRAMUSCULAR | Status: AC | PRN
  Administered 2016-10-03: 1 mg via INTRAVENOUS

## 2016-10-03 MED ORDER — AMIODARONE HCL 200 MG PO TABS
200.0000 mg | ORAL_TABLET | Freq: Every day | ORAL | Status: DC
  Administered 2016-10-04 – 2016-10-05 (×2): 200 mg via ORAL
  Filled 2016-10-03 (×2): qty 1

## 2016-10-03 MED ORDER — LORATADINE 10 MG PO TABS: 10.0000 mg | ORAL_TABLET | Freq: Every day | ORAL | Status: DC | PRN

## 2016-10-03 MED ORDER — SODIUM CHLORIDE 0.9 % IV SOLN: INTRAVENOUS | Status: DC

## 2016-10-03 NOTE — OR Nursing (Signed)
Pt c/o of mild pain from chest tube site. Patient states she usually takes tylenol for pain. PA notified and order in for '650mg'$  tylenol. VSS

## 2016-10-03 NOTE — Sedation Documentation (Addendum)
na

## 2016-10-03 NOTE — Procedures (Signed)
Technically successful CT guided biopsy of right upper lobe mass  EBL: None.   Procedure complicated by development of a slowly enlarging right sided PTX necessitating placement of a right sided chest tube.   Technically successful CT guided placed of a 10 Fr drainage catheter placement into the right pleural space .   EBL: None  No other immediate post procedural complications.   Ronny Bacon, MD Pager #: [phone]

## 2016-10-03 NOTE — Sedation Documentation (Signed)
Pt resting in radiology nurses station. Husband at bedside, no complaints at this time.

## 2016-10-03 NOTE — Discharge Instructions (Signed)
Needle Biopsy of Lung, Care After Refer to this sheet in the next few weeks. These instructions provide you with information on caring for yourself after your procedure. Your health care provider may also give you more specific instructions. Your treatment has been planned according to current medical practices, but problems sometimes occur. Call your health care provider if you have any problems or questions after your procedure. WHAT TO EXPECT AFTER THE PROCEDURE  A bandage will be applied over the area where the needle was inserted. You may be asked to apply pressure to the bandage for several minutes to ensure there is minimal bleeding.  In most cases, you can leave when your needle biopsy procedure is completed. Do not drive yourself home. Someone else should take you home.  If you received an IV sedative or general anesthetic, you will be taken to a comfortable place to relax while the medicine wears off.  If you have upcoming travel scheduled, talk to your health care provider about when it is safe to travel by air after the procedure. HOME CARE INSTRUCTIONS  Expect to take it easy for the rest of the day.  Protect the area where you received the needle biopsy by keeping the bandage in place for as long as instructed.  You may feel some mild pain or discomfort in the area, but this should stop in a day or two.  Take medicines only as directed by your health care provider. SEEK MEDICAL CARE IF:   You have pain at the biopsy site that worsens or is not helped by medicine.  You have swelling or drainage at the needle biopsy site.  You have a fever. SEEK IMMEDIATE MEDICAL CARE IF:   You have new or worsening shortness of breath.  You have chest pain.  You are coughing up blood.  You have bleeding that does not stop with pressure or a bandage.  You develop light-headedness or fainting. This information is not intended to replace advice given to you by your health care  provider. Make sure you discuss any questions you have with your health care provider. Document Released: 06/11/2007 Document Revised: 09/04/2014 Document Reviewed: 01/06/2013 Elsevier Interactive Patient Education  2017 [employer]. Moderate Conscious Sedation, Adult, Care After These instructions provide you with information about caring for yourself after your procedure. Your health care provider may also give you more specific instructions. Your treatment has been planned according to current medical practices, but problems sometimes occur. Call your health care provider if you have any problems or questions after your procedure. What can I expect after the procedure? After your procedure, it is common:  To feel sleepy for several hours.  To feel clumsy and have poor balance for several hours.  To have poor judgment for several hours.  To vomit if you eat too soon. Follow these instructions at home: For at least 24 hours after the procedure:   Do not:  Participate in activities where you could fall or become injured.  Drive.  Use heavy machinery.  Drink alcohol.  Take sleeping pills or medicines that cause drowsiness.  Make important decisions or sign legal documents.  Take care of children on your own.  Rest. Eating and drinking  Follow the diet recommended by your health care provider.  If you vomit:  Drink water, juice, or soup when you can drink without vomiting.  Make sure you have little or no nausea before eating solid foods. General instructions  Have a responsible adult stay with  you until you are awake and alert.  Take over-the-counter and prescription medicines only as told by your health care provider.  If you smoke, do not smoke without supervision.  Keep all follow-up visits as told by your health care provider. This is important. Contact a health care provider if:  You keep feeling nauseous or you keep vomiting.  You feel  light-headed.  You develop a rash.  You have a fever. Get help right away if:  You have trouble breathing. This information is not intended to replace advice given to you by your health care provider. Make sure you discuss any questions you have with your health care provider. Document Released: 06/04/2013 Document Revised: 01/17/2016 Document Reviewed: 12/04/2015 Elsevier Interactive Patient Education  2017 [employer].

## 2016-10-03 NOTE — H&P (Signed)
Chief Complaint: Patient was seen in consultation today for right lung mass biopsy at the request of [place]  Referring Physician(s): Kinard,James  Supervising Physician: Sandi Mariscal  Patient Status: [facility] - Out-pt  History of Present Illness: Jodi Burns is a 83 y.o. female   Pt presented to MD after 2-3 weeks of worsening shortness of breath Admitted to [facility] and work up included CT Chest 12/24: IMPRESSION: 1.  No evidence of pulmonary embolism. 2. Right upper lobe lung mass, consistent with primary bronchogenic carcinoma. No thoracic adenopathy. 3. Irregular left upper lobe opacity could represent a synchronous primary bronchogenic carcinoma. A focus of infection is felt less likely. 4. Scattered nonspecific smaller pulmonary nodules. 5.  Coronary artery atherosclerosis. Aortic atherosclerosis.  PET 09/15/16: IMPRESSION: 1. Mass in the right upper lobe measures 4 cm and exhibits intense FDG uptake, suspicious for primary bronchogenic carcinoma. 2. Small nodule in the left upper lobe exhibits low level malignant range FDG uptake and is worrisome for primary lung neoplasm. 3. No evidence for mediastinal or hilar nodal metastasis or distant metastatic disease. 4. Emphysema 5. Aortic atherosclerosis and coronary artery calcification. 6. Small right pleural effusion. Overlying airspace consolidation within the right lower lobe concerning for pneumonia and/or aspiration. 7. Prior granulomatous disease.  Request for biopsy and core samples for molecular testing per Dr Sondra Come Scheduled now for same Last dose Eliquis Sun 2/4 9 am (afib)   Past Medical History:  Diagnosis Date  . Allergic rhinitis   . Atrial fibrillation ([place])   . Dyspnea on exertion   . 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 Osteopenia   . Other and unspecified hyperlipidemia   . Other retinal disorders(362.89)   .  Personal history of other diseases of circulatory system   . Personal history of venous thrombosis and embolism 4/94  . Reactive airway disease   . Subjective visual disturbance, unspecified   . Unspecified asthma(493.90)   . Vitamin D deficiency     Past Surgical History:  Procedure Laterality Date  . APPENDECTOMY    . BREAST BIOPSY    . BREAST BIOPSY     bilateral  . CARDIOVASCULAR STRESS TEST  August 2013   CPET-MET: Submaximal effort, did not reach anaerobic threshold --> peak VO2 76% (not overly concerning for CAD)  . CARDIOVERSION N/A 08/29/2016   Procedure: CARDIOVERSION;  Surgeon: Sanda Klein, MD;  Location: [facility];  Service: Cardiovascular;  Laterality: N/A;  . CARPAL TUNNEL RELEASE  2006   right  . PERINEOPLASTY     and rectocele  . Pulmonary Function Test  August 2013   CPET-PFTs: FVC 58%, FEV1 48%; breathing reserve less than 10%, normal DLCO; reduced vital capacity -- suggesting obstructive lung disease  . ROTATOR CUFF REPAIR  7/04, 2003   right, left  . TEE WITHOUT CARDIOVERSION N/A 08/29/2016   Procedure: TRANSESOPHAGEAL ECHOCARDIOGRAM (TEE);  Surgeon: Sanda Klein, MD;  Location: [facility];  Service: Cardiovascular;  Laterality: N/A;  . TOTAL ABDOMINAL HYSTERECTOMY    . TRANSTHORACIC ECHOCARDIOGRAM  February 2011   EF 55-60%. Grade 1 diastolic dysfunction/relaxation abnormality. Mild MR    Allergies: Cefaclor and Codeine  Medications: Prior to Admission medications   Medication Sig Start Date End Date Taking? Authorizing Provider  amiodarone (PACERONE) 200 MG tablet Take 1 tablet (200 mg total) by mouth daily. 09/11/16  Yes Robbie Lis, MD  apixaban (ELIQUIS) 2.5 MG TABS tablet Take 1  tablet (2.5 mg total) by mouth 2 (two) times daily. 08/25/16  Yes Nita Sells, MD  budesonide-formoterol (SYMBICORT) 160-4.5 MCG/ACT inhaler Inhale 2 puffs into the lungs 2 (two) times daily. Rinse mouth Patient taking differently: Inhale 1 puff into the lungs  2 (two) times daily. Rinse mouth 03/22/16 04/06/17 Yes Clinton D Young, MD  Cholecalciferol (VITAMIN D3) 5000 units TABS Take 2 tablets by mouth daily.   Yes Historical Provider, MD  Coenzyme Q10 (COQ10) 200 MG CAPS Take 1 capsule by mouth daily.    Yes Historical Provider, MD  cyanocobalamin 1000 MCG tablet Take 2,000 mcg by mouth daily.   Yes Historical Provider, MD  diltiazem (CARDIZEM SR) 60 MG 12 hr capsule Take 60 mg by mouth 2 (two) times daily.   Yes Historical Provider, MD  dorzolamide-timolol (COSOPT) 22.3-6.8 MG/ML ophthalmic solution Place 1 drop into both eyes 2 (two) times daily.     Yes Historical Provider, MD  fluocinonide (LIDEX) 0.05 % external solution Apply 1 application topically once a week. scalp 07/27/16  Yes Historical Provider, MD  ipratropium (ATROVENT) 0.03 % nasal spray 1-2 puffs each nostril every 8 hours if needed 01/20/16  Yes Deneise Lever, MD  ipratropium-albuterol (DUONEB) 0.5-2.5 (3) MG/3ML SOLN Take 3 mLs by nebulization every 4 (four) hours as needed. Patient taking differently: Take 3 mLs by nebulization every 6 (six) hours as needed.  08/31/16  Yes Robbie Lis, MD  Multiple Vitamins-Minerals (PRESERVISION/LUTEIN) CAPS Take 1 capsule by mouth 2 (two) times daily.     Yes Historical Provider, MD  OXYGEN 2LPM   Yes Historical Provider, MD  acetaminophen (TYLENOL) 325 MG tablet Take 650 mg by mouth every 6 (six) hours as needed for mild pain.     Historical Provider, MD  loratadine (CLARITIN) 10 MG tablet Take 10 mg by mouth daily as needed for allergies.     Historical Provider, MD     Family History  Problem Relation Age of Onset  . Osteoporosis Mother   . Diabetes Paternal Aunt     Social History   Social History  . Marital status: Married    Spouse name: N/A  . Number of children: N/A  . Years of education: N/A   Occupational History  . pediatrician     retired   Social History Main Topics  . Smoking status: Former Smoker    Types: Cigarettes     Quit date: 1969  . Smokeless tobacco: Never Used     Comment: 3/4 ppd 1946-1969 (17.5 pack years)  . Alcohol use Yes     Comment: high ball prior to dinner  . Drug use: No  . Sexual activity: No   Other Topics Concern  . None   Social History Narrative   She is here with her husband - a retired Lexicographer.    They have 2 children, 3 grandchildren.    She does not smoke (never smoked), and does not drink.    She does not really get a lot of exercise besides what I mentioned. She does walk with   her husband but no additional exercise beyond that, but which is still relatively adequate.     Review of Systems: A 12 point ROS discussed and pertinent positives are indicated in the HPI above.  All other systems are negative.  Review of Systems  Constitutional: Positive for activity change. Negative for fatigue and unexpected weight change.  Respiratory: Positive for cough and shortness of breath.   Cardiovascular: Negative  for chest pain.  Gastrointestinal: Negative for abdominal pain.  Neurological: Positive for weakness.  Psychiatric/Behavioral: Negative for behavioral problems and confusion.    Vital Signs: BP (!) 116/48   Pulse 76   Temp 97.4 F (36.3 C)   Resp 20   LMP 08/29/1967   SpO2 100%   Physical Exam  Constitutional: She is oriented to person, place, and time.  Thin and frail  Eyes:  Legally blind  Cardiovascular:  No murmur heard. irreg rate  Pulmonary/Chest: Effort normal and breath sounds normal. She has no wheezes.  Abdominal: Soft. Bowel sounds are normal. There is no tenderness.  Musculoskeletal: Normal range of motion.  Neurological: She is alert and oriented to person, place, and time.  Skin: Skin is warm and dry.  Psychiatric: She has a normal mood and affect. Her behavior is normal. Judgment and thought content normal.  Husband signs consent Pt is blind  Nursing note and vitals reviewed.   Mallampati Score:  MD Evaluation Airway:  WNL Heart: WNL Abdomen: WNL Chest/ Lungs: WNL ASA  Classification: 3 Mallampati/Airway Score: One  Imaging: Nm Pet Image Initial (pi) Skull Base To Thigh  Result Date: 09/15/2016 CLINICAL DATA:  Initial treatment strategy for lung mass. EXAM: NUCLEAR MEDICINE PET SKULL BASE TO THIGH TECHNIQUE: 6.11 mCi F-18 FDG was injected intravenously. Full-ring PET imaging was performed from the skull base to thigh after the radiotracer. CT data was obtained and used for attenuation correction and anatomic localization. FASTING BLOOD GLUCOSE:  Value: 107 mg/dl COMPARISON:  08/20/2016 FINDINGS: NECK No hypermetabolic lymph nodes in the neck. CHEST The heart size appears normal. Aortic atherosclerosis identified. Calcifications involving the LAD and RCA coronary artery noted. No mediastinal or hilar adenopathy identified. There is a small right pleural effusion. Overlying airspace consolidation which appears hypermetabolic is identified within the right lower lobe. This is favored to represent pneumonia and or aspiration. Right upper lobe mass measures 4 cm and has an SUV max equal to 34.7. Spiculated density within the scratch set spiculated nodular density in the left upper lobe measures 1.4 cm and has an SUV max equal to 1.65. Mild changes of centrilobular emphysema. ABDOMEN/PELVIS No abnormal hypermetabolic activity within the liver, pancreas, adrenal glands, or spleen. Calcified splenic and liver granulomas. No hypermetabolic lymph nodes in the abdomen or pelvis. SKELETON No focal hypermetabolic activity to suggest skeletal metastasis. IMPRESSION: 1. Mass in the right upper lobe measures 4 cm and exhibits intense FDG uptake, suspicious for primary bronchogenic carcinoma. 2. Small nodule in the left upper lobe exhibits low level malignant range FDG uptake and is worrisome for primary lung neoplasm. 3. No evidence for mediastinal or hilar nodal metastasis or distant metastatic disease. 4. Emphysema 5. Aortic  atherosclerosis and coronary artery calcification. 6. Small right pleural effusion. Overlying airspace consolidation within the right lower lobe concerning for pneumonia and/or aspiration. 7. Prior granulomatous disease. Electronically Signed   By: Kerby Moors M.D.   On: 09/15/2016 10:49    Labs:  CBC:  Recent Labs  08/22/16 0455 08/25/16 0445 08/31/16 0158 10/03/16 0925  WBC 9.3 12.0* 10.6* 8.9  HGB 13.1 11.9* 12.3 13.3  HCT 41.2 37.1 37.9 42.5  PLT 236 227 349 275    COAGS:  Recent Labs  10/03/16 0925  INR 1.02  APTT 28    BMP:  Recent Labs  08/21/16 0247 08/24/16 0153 08/25/16 1157 08/31/16 0158  NA 141 141 138 137  K 3.6 4.1 3.9 3.2*  CL 108 106 104 101  CO2 '26 27 25 28  ' GLUCOSE 108* 113* 105* 113*  BUN '10 8 8 6  ' CALCIUM 8.4* 8.8* 8.9 8.8*  CREATININE 0.64 0.63 0.57 0.52  GFRNONAA >60 >60 >60 >60  GFRAA >60 >60 >60 >60    LIVER FUNCTION TESTS: No results for input(s): BILITOT, AST, ALT, ALKPHOS, PROT, ALBUMIN in the last 8760 hours.  TUMOR MARKERS: No results for input(s): AFPTM, CEA, CA199, CHROMGRNA in the last 8760 hours.  Assessment and Plan:  Right lung mass Left lung nodule + PET Scheduled for biopsy with possible chest tube placement Risks and Benefits discussed with the patient including, but not limited to bleeding, hemoptysis, respiratory failure requiring intubation, infection, pneumothorax requiring chest tube placement, stroke from air embolism or even death. All of the patient's questions were answered, patient is agreeable to proceed. Consent signed and in chart.   Thank you for this interesting consult.  I greatly enjoyed meeting Jodi Burns and look forward to participating in their care.  A copy of this report was sent to the requesting provider on this date.  Electronically Signed: Monia Sabal A 10/03/2016, 10:40 AM   I spent a total of  30 Minutes   in face to face in clinical consultation, greater than 50% of  which was counseling/coordinating care for right lung mass biopsy

## 2016-10-03 NOTE — Consult Note (Signed)
Medical Consultation   ARROW EMMERICH  SHF:[id number]  DOB: 20-Jul-1926  DOA: 10/03/2016  PCP: Jerlyn Ly, MD   Outpatient Specialists: Cardiology, Oncology   Requesting physician: Dr. Pascal Lux - IR  Reason for consultation: Medical mgt after admission for pneumothorax  History of Present Illness: Jodi Burns is an 83 y.o. female With a past medical history significant for atrial fibrillation, hyperlipidemia, DVT/PE, asthma. Patient presented on day of admission to interventional radiology for elective CT-guided biopsy of right upper lobe lung mass. Procedure was compensated by development of pneumothorax. Chest tube was placed by IR. [facility] will consult to assist with general medical management. Of note prior to admission patient endorses being at her baseline health and denies any recent fevers, nausea, vomiting, chest pain, palpitations, cough, abdominal pain, nausea, vomiting, diarrhea, dysuria, frequency, back pain, neck stiffness, headache. Of note patient was admitted to [facility] on 08/20/2016 with acute respiratory failure and atrial fibrillation with RVR. Systemic discharge patient has felt well and denies any unintentional weight loss.   Review of Systems:  ROS As per HPI otherwise 10 point review of systems negative.    Past Medical History: Past Medical History:  Diagnosis Date  . Allergic rhinitis   . Atrial fibrillation ([place])   . Dyspnea on exertion   . 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 Osteopenia   . Other and unspecified hyperlipidemia   . Other retinal disorders(362.89)   . Personal history of other diseases of circulatory system   . Personal history of venous thrombosis and embolism 4/94  . Reactive airway disease   . Subjective visual disturbance, unspecified   . Unspecified asthma(493.90)   . Vitamin D deficiency     Past Surgical  History: Past Surgical History:  Procedure Laterality Date  . APPENDECTOMY    . BREAST BIOPSY    . BREAST BIOPSY     bilateral  . CARDIOVASCULAR STRESS TEST  August 2013   CPET-MET: Submaximal effort, did not reach anaerobic threshold --> peak VO2 76% (not overly concerning for CAD)  . CARDIOVERSION N/A 08/29/2016   Procedure: CARDIOVERSION;  Surgeon: Sanda Klein, MD;  Location: [place];  Service: Cardiovascular;  Laterality: N/A;  . CARPAL TUNNEL RELEASE  2006   right  . PERINEOPLASTY     and rectocele  . Pulmonary Function Test  August 2013   CPET-PFTs: FVC 58%, FEV1 48%; breathing reserve less than 10%, normal DLCO; reduced vital capacity -- suggesting obstructive lung disease  . ROTATOR CUFF REPAIR  7/04, 2003   right, left  . TEE WITHOUT CARDIOVERSION N/A 08/29/2016   Procedure: TRANSESOPHAGEAL ECHOCARDIOGRAM (TEE);  Surgeon: Sanda Klein, MD;  Location: [facility] ENDOSCOPY;  Service: Cardiovascular;  Laterality: N/A;  . TOTAL ABDOMINAL HYSTERECTOMY    . TRANSTHORACIC ECHOCARDIOGRAM  February 2011   EF 55-60%. Grade 1 diastolic dysfunction/relaxation abnormality. Mild MR     Allergies:   Allergies  Allergen Reactions  . Cefaclor     REACTION: gi upset  . Codeine Nausea And Vomiting     Social History:  reports that she quit smoking about 49 years ago. Her smoking use included Cigarettes. She has never used smokeless tobacco. She reports that she drinks alcohol. She reports that she does not use drugs.   Family History: Family History  Problem Relation Age of Onset  .  Osteoporosis Mother   . Diabetes Paternal Aunt      Physical Exam: There were no vitals filed for this visit.  General:  Appears calm and comfortable Eyes:  PERRL, EOMI, normal lids, iris ENT:  grossly normal hearing, lips & tongue, mmm Neck:  no LAD, masses or thyromegaly Cardiovascular:  RRR, no m/r/g. No LE edema.  Respiratory: Absent respiratory sounds in the right upper lobe with gurgling  sound present. Other lung fields clear to auscultation. Increased effort with exertion. Abdomen:  soft, ntnd, NABS Skin:  Right upper chest with bandages that are CDI and Betadine. No rashes.  Musculoskeletal:  grossly normal tone BUE/BLE, good ROM, no bony abnormality Psychiatric:  grossly normal mood and affect, speech fluent and appropriate, AOx3 Neurologic:  CN 2-12 grossly intact, moves all extremities in coordinated fashion, sensation intact  Data reviewed:  I have personally reviewed following labs and imaging studies Labs:  CBC:  Recent Labs Lab 10/03/16 0925  WBC 8.9  HGB 13.3  HCT 42.5  MCV 95.3  PLT 161    Basic Metabolic Panel: No results for input(s): NA, K, CL, CO2, GLUCOSE, BUN, CREATININE, CALCIUM, MG, PHOS in the last 168 hours. GFR CrCl cannot be calculated (Patient's most recent lab result is older than the maximum 21 days allowed.). Liver Function Tests: No results for input(s): AST, ALT, ALKPHOS, BILITOT, PROT, ALBUMIN in the last 168 hours. No results for input(s): LIPASE, AMYLASE in the last 168 hours. No results for input(s): AMMONIA in the last 168 hours. Coagulation profile  Recent Labs Lab 10/03/16 0925  INR 1.02    Cardiac Enzymes: No results for input(s): CKTOTAL, CKMB, CKMBINDEX, TROPONINI in the last 168 hours. BNP: Invalid input(s): POCBNP CBG: No results for input(s): GLUCAP in the last 168 hours. D-Dimer No results for input(s): DDIMER in the last 72 hours. Hgb A1c No results for input(s): HGBA1C in the last 72 hours. Lipid Profile No results for input(s): CHOL, HDL, LDLCALC, TRIG, CHOLHDL, LDLDIRECT in the last 72 hours. Thyroid function studies No results for input(s): TSH, T4TOTAL, T3FREE, THYROIDAB in the last 72 hours.  Invalid input(s): FREET3 Anemia work up No results for input(s): VITAMINB12, FOLATE, FERRITIN, TIBC, IRON, RETICCTPCT in the last 72 hours. Urinalysis    Component Value Date/Time   COLORURINE YELLOW  08/21/2016 0705   APPEARANCEUR CLEAR 08/21/2016 0705   LABSPEC 1.031 (H) 08/21/2016 0705   PHURINE 5.0 08/21/2016 0705   GLUCOSEU NEGATIVE 08/21/2016 0705   HGBUR NEGATIVE 08/21/2016 0705   BILIRUBINUR NEGATIVE 08/21/2016 0705   KETONESUR NEGATIVE 08/21/2016 0705   PROTEINUR NEGATIVE 08/21/2016 0705   NITRITE NEGATIVE 08/21/2016 0705   LEUKOCYTESUR TRACE (A) 08/21/2016 0705     Microbiology No results found for this or any previous visit (from the past 240 hour(s)).     Inpatient Medications:   Scheduled Meds: Continuous Infusions:   Radiological Exams on Admission: Ct Biopsy  Result Date: 10/03/2016 INDICATION: Indeterminate hypermetabolic right upper lobe pulmonary mass. Please perform CT-guided biopsy for tissue diagnostic purposes. EXAM: 1. CT-GUIDED RIGHT UPPER LOBE PULMONARY MASS BIOPSY 2. CT-GUIDED RIGHT-SIDED CHEST TUBE PLACEMENT COMPARISON:  PET-CT - 09/15/2016; chest CT - 08/20/2016 MEDICATIONS: None. ANESTHESIA/SEDATION: Fentanyl 2 mcg IV; Versed 75 mg IV Sedation time: 44 minutes; The patient was continuously monitored during the procedure by the interventional radiology nurse under my direct supervision. CONTRAST:  None COMPLICATIONS: SIR LEVEL C - Requires therapy, minor hospitalization (<48 hrs). Procedure complicated by development of a asymptomatic though slowly enlarging right-sided  pneumothorax requiring placement of a CT-guided chest tube. PROCEDURE: Informed consent was obtained from the patient following an explanation of the procedure, risks, benefits and alternatives. The patient understands,agrees and consents for the procedure. All questions were addressed. A time out was performed prior to the initiation of the procedure. The patient was positioned supine on the CT table and a limited chest CT was performed for procedural planning demonstrating unchanged size and appearance of the known macrolobulated approximately 4.3 x 3.4 cm mass within the right upper lobe  (image 17, series 2). The operative site was prepped and draped in the usual sterile fashion. Under sterile conditions and local anesthesia, a 17 gauge coaxial needle was advanced into the peripheral aspect of the nodule. Positioning was confirmed with intermittent CT fluoroscopy and followed by the acquisition of 4 core needle biopsy samples with an 18 gauge core needle biopsy device. The coaxial needle was removed and superficial hemostasis was achieved with manual compression. Postprocedural imaging demonstrated development of a small postprocedural pneumothorax. While the patient remained asymptomatic, 15 minute delayed imaging demonstrated interval enlargement of the right-sided pneumothorax and the decision was made to place a CT-guided chest tube given pneumothorax enlargement. The skin overlying the right anterior upper chest was prepped and draped in usual sterile fashion. A 22 gauge needle was utilized for trajectory planning purposes after the overlying soft tissues were anesthetized with 1% lidocaine. An 18 gauge trocar needle was advanced into the right pleural space and Amplatz wire was coiled within the medial aspect of the right hemithorax. Appropriate positioning was confirmed. The track was dilated allowing placement of 10 French percutaneous drainage catheter. The drainage catheter was secured at the skin entrance site within interrupted suture and connected to a Pleur-Evac device. Postprocedural imaging was obtained demonstrating near complete evacuation of the right-sided pneumothorax. Dressings were placed. The patient otherwise tolerated the procedure well immediate postprocedural complication. IMPRESSION: 1. Technically successful CT guided core needle core biopsy of right upper lobe pulmonary mass biopsy. 2. Procedure complicated by development of a slowly enlarging though asymptomatic pneumothorax necessitating placement of a right-sided chest tube. PLAN: Patient will be admitted for  continued observation and chest tube management. Electronically Signed   By: Sandi Mariscal M.D.   On: 10/03/2016 15:02   Dg Chest Port 1 View  Result Date: 10/03/2016 CLINICAL DATA:  Posterior right chest tube for right pneumothorax EXAM: PORTABLE CHEST 1 VIEW COMPARISON:  Chest x-ray 10/03/2016 FINDINGS: Right chest tube remains in place. No right pneumothorax is seen. Masslike opacity in right upper lobe is stable. IMPRESSION: No definite pneumothorax with right chest tube present. Electronically Signed   By: Ivar Drape M.D.   On: 10/03/2016 15:02   Dg Chest Port 1 View  Result Date: 10/03/2016 CLINICAL DATA:  Chest tube in place EXAM: PORTABLE CHEST 1 VIEW COMPARISON:  PET-CT dated 09/15/2016 FINDINGS: Right upper lobe mass. Small bore chest drain overlying the medial right upper lung. No pneumothorax is seen. Left lung is clear. The heart is normal in size. Thoracic aortic atherosclerosis. IMPRESSION: Small bore chest drain overlying the medial right upper lung. No pneumothorax is seen. Right upper lobe mass. Thoracic aortic atherosclerosis. Electronically Signed   By: Julian Hy M.D.   On: 10/03/2016 14:04   Ct Image Guided Drainage By Percutaneous Catheter  Result Date: 10/03/2016 INDICATION: Indeterminate hypermetabolic right upper lobe pulmonary mass. Please perform CT-guided biopsy for tissue diagnostic purposes. EXAM: 1. CT-GUIDED RIGHT UPPER LOBE PULMONARY MASS BIOPSY 2. CT-GUIDED RIGHT-SIDED CHEST  TUBE PLACEMENT COMPARISON:  PET-CT - 09/15/2016; chest CT - 08/20/2016 MEDICATIONS: None. ANESTHESIA/SEDATION: Fentanyl 2 mcg IV; Versed 75 mg IV Sedation time: 44 minutes; The patient was continuously monitored during the procedure by the interventional radiology nurse under my direct supervision. CONTRAST:  None COMPLICATIONS: SIR LEVEL C - Requires therapy, minor hospitalization (<48 hrs). Procedure complicated by development of a asymptomatic though slowly enlarging right-sided pneumothorax  requiring placement of a CT-guided chest tube. PROCEDURE: Informed consent was obtained from the patient following an explanation of the procedure, risks, benefits and alternatives. The patient understands,agrees and consents for the procedure. All questions were addressed. A time out was performed prior to the initiation of the procedure. The patient was positioned supine on the CT table and a limited chest CT was performed for procedural planning demonstrating unchanged size and appearance of the known macrolobulated approximately 4.3 x 3.4 cm mass within the right upper lobe (image 17, series 2). The operative site was prepped and draped in the usual sterile fashion. Under sterile conditions and local anesthesia, a 17 gauge coaxial needle was advanced into the peripheral aspect of the nodule. Positioning was confirmed with intermittent CT fluoroscopy and followed by the acquisition of 4 core needle biopsy samples with an 18 gauge core needle biopsy device. The coaxial needle was removed and superficial hemostasis was achieved with manual compression. Postprocedural imaging demonstrated development of a small postprocedural pneumothorax. While the patient remained asymptomatic, 15 minute delayed imaging demonstrated interval enlargement of the right-sided pneumothorax and the decision was made to place a CT-guided chest tube given pneumothorax enlargement. The skin overlying the right anterior upper chest was prepped and draped in usual sterile fashion. A 22 gauge needle was utilized for trajectory planning purposes after the overlying soft tissues were anesthetized with 1% lidocaine. An 18 gauge trocar needle was advanced into the right pleural space and Amplatz wire was coiled within the medial aspect of the right hemithorax. Appropriate positioning was confirmed. The track was dilated allowing placement of 10 French percutaneous drainage catheter. The drainage catheter was secured at the skin entrance site  within interrupted suture and connected to a Pleur-Evac device. Postprocedural imaging was obtained demonstrating near complete evacuation of the right-sided pneumothorax. Dressings were placed. The patient otherwise tolerated the procedure well immediate postprocedural complication. IMPRESSION: 1. Technically successful CT guided core needle core biopsy of right upper lobe pulmonary mass biopsy. 2. Procedure complicated by development of a slowly enlarging though asymptomatic pneumothorax necessitating placement of a right-sided chest tube. PLAN: Patient will be admitted for continued observation and chest tube management. Electronically Signed   By: Sandi Mariscal M.D.   On: 10/03/2016 15:02    Impression/Recommendations Active Problems:   Hyperlipidemia with target LDL less than 130   PAF (paroxysmal atrial fibrillation) (HCC)   Essential hypertension   Lung mass   COPD GOLD II   Pneumothorax after biopsy   Asthma  Pneumothorax: RUL after CT guided biopsy. Chest tube placed by IR. CXR confirming placement - PUlse ox and Tele - Continue chest tube per IR - CXR in am  Afib/HTN: controlled. - continue Dilt and Amio in am (last dose in am on 10/03/16) - Hydralazine prn - Hold Eliquis until chest tube removed.   Asthma/RAD: at baseline - continue Dulera - Albuterol PRN  RUL mass: concern for bronchogenic carcinoma. CT guided biopsy on 10/03/16 by IR - await path report - f/u Oncology   Thank you for this consultation.  Our [facility] hospitalist team will  follow the patient with you.   Bali Lyn J M.D. [facility] 10/03/2016, 4:09 PM

## 2016-10-04 ENCOUNTER — Observation Stay (HOSPITAL_COMMUNITY): Payer: Medicare Other

## 2016-10-04 DIAGNOSIS — J95811 Postprocedural pneumothorax: Secondary | ICD-10-CM | POA: Diagnosis not present

## 2016-10-04 DIAGNOSIS — J45909 Unspecified asthma, uncomplicated: Secondary | ICD-10-CM

## 2016-10-04 DIAGNOSIS — I480 Paroxysmal atrial fibrillation: Secondary | ICD-10-CM

## 2016-10-04 DIAGNOSIS — C3411 Malignant neoplasm of upper lobe, right bronchus or lung: Secondary | ICD-10-CM | POA: Diagnosis present

## 2016-10-04 DIAGNOSIS — Z87891 Personal history of nicotine dependence: Secondary | ICD-10-CM | POA: Diagnosis not present

## 2016-10-04 DIAGNOSIS — I2510 Atherosclerotic heart disease of native coronary artery without angina pectoris: Secondary | ICD-10-CM | POA: Diagnosis present

## 2016-10-04 DIAGNOSIS — Z79899 Other long term (current) drug therapy: Secondary | ICD-10-CM | POA: Diagnosis not present

## 2016-10-04 DIAGNOSIS — M8580 Other specified disorders of bone density and structure, unspecified site: Secondary | ICD-10-CM | POA: Diagnosis present

## 2016-10-04 DIAGNOSIS — E559 Vitamin D deficiency, unspecified: Secondary | ICD-10-CM | POA: Diagnosis present

## 2016-10-04 DIAGNOSIS — J439 Emphysema, unspecified: Secondary | ICD-10-CM | POA: Diagnosis present

## 2016-10-04 DIAGNOSIS — H548 Legal blindness, as defined in USA: Secondary | ICD-10-CM | POA: Diagnosis present

## 2016-10-04 DIAGNOSIS — I700 Atherosclerosis of aorta: Secondary | ICD-10-CM | POA: Diagnosis present

## 2016-10-04 DIAGNOSIS — Z7901 Long term (current) use of anticoagulants: Secondary | ICD-10-CM | POA: Diagnosis not present

## 2016-10-04 DIAGNOSIS — Z4682 Encounter for fitting and adjustment of non-vascular catheter: Secondary | ICD-10-CM | POA: Diagnosis not present

## 2016-10-04 DIAGNOSIS — J939 Pneumothorax, unspecified: Secondary | ICD-10-CM | POA: Diagnosis not present

## 2016-10-04 DIAGNOSIS — R911 Solitary pulmonary nodule: Secondary | ICD-10-CM | POA: Diagnosis present

## 2016-10-04 DIAGNOSIS — R0602 Shortness of breath: Secondary | ICD-10-CM | POA: Diagnosis not present

## 2016-10-04 DIAGNOSIS — Z9689 Presence of other specified functional implants: Secondary | ICD-10-CM

## 2016-10-04 DIAGNOSIS — I10 Essential (primary) hypertension: Secondary | ICD-10-CM | POA: Diagnosis not present

## 2016-10-04 DIAGNOSIS — Z7952 Long term (current) use of systemic steroids: Secondary | ICD-10-CM | POA: Diagnosis not present

## 2016-10-04 DIAGNOSIS — E785 Hyperlipidemia, unspecified: Secondary | ICD-10-CM | POA: Diagnosis not present

## 2016-10-04 LAB — BASIC METABOLIC PANEL
ANION GAP: 10 (ref 5–15)
BUN: 7 mg/dL (ref 6–20)
CALCIUM: 8.7 mg/dL — AB (ref 8.9–10.3)
CHLORIDE: 103 mmol/L (ref 101–111)
CO2: 25 mmol/L (ref 22–32)
CREATININE: 0.65 mg/dL (ref 0.44–1.00)
GFR calc non Af Amer: >60 mL/min (ref >60)
Glucose, Bld: 92 mg/dL (ref 65–99)
Potassium: 4.4 mmol/L (ref 3.5–5.1)
SODIUM: 138 mmol/L (ref 135–145)

## 2016-10-04 LAB — CBC
HCT: 39.6 % (ref 36.0–46.0)
HEMOGLOBIN: 12.4 g/dL (ref 12.0–15.0)
MCH: 30 pg (ref 26.0–34.0)
MCHC: 31.3 g/dL (ref 30.0–36.0)
MCV: 95.9 fL (ref 78.0–100.0)
PLATELETS: 228 10*3/uL (ref 150–400)
RBC: 4.13 MIL/uL (ref 3.87–5.11)
RDW: 15.3 % (ref 11.5–15.5)
WBC: 7.4 10*3/uL (ref 4.0–10.5)

## 2016-10-04 MED ORDER — APIXABAN 2.5 MG PO TABS
2.5000 mg | ORAL_TABLET | Freq: Two times a day (BID) | ORAL | Status: DC
  Administered 2016-10-04 – 2016-10-05 (×2): 2.5 mg via ORAL
  Filled 2016-10-04 (×2): qty 1

## 2016-10-04 NOTE — Progress Notes (Signed)
Patient ID: Jodi Burns, female   DOB: Jan 20, 1926, 83 y.o.   MRN: [id number]    Referring Physician(s): Watts,John  Supervising Physician: Aletta Edouard  Patient Status: [facility] - In-pt  Chief Complaint: PTX after lung bx  Subjective: Patient feels well today.  No complaints.  No SOB, tolerating a regular diet.    Allergies: Cefaclor and Codeine  Medications: Prior to Admission medications   Medication Sig Start Date End Date Taking? Authorizing Provider  acetaminophen (TYLENOL) 325 MG tablet Take 650 mg by mouth every 6 (six) hours as needed for mild pain.    Yes Historical Provider, MD  amiodarone (PACERONE) 200 MG tablet Take 1 tablet (200 mg total) by mouth daily. 09/11/16  Yes Robbie Lis, MD  apixaban (ELIQUIS) 2.5 MG TABS tablet Take 1 tablet (2.5 mg total) by mouth 2 (two) times daily. 08/25/16  Yes Nita Sells, MD  budesonide-formoterol (SYMBICORT) 160-4.5 MCG/ACT inhaler Inhale 2 puffs into the lungs 2 (two) times daily. Rinse mouth Patient taking differently: Inhale 1 puff into the lungs 2 (two) times daily. Rinse mouth 03/22/16 04/06/17 Yes Clinton D Young, MD  Cholecalciferol (VITAMIN D3) 5000 units TABS Take 2 tablets by mouth daily.   Yes Historical Provider, MD  Coenzyme Q10 (COQ10) 200 MG CAPS Take 1 capsule by mouth daily.    Yes Historical Provider, MD  cyanocobalamin 1000 MCG tablet Take 2,000 mcg by mouth daily.   Yes Historical Provider, MD  diltiazem (CARDIZEM SR) 60 MG 12 hr capsule Take 60 mg by mouth 2 (two) times daily.   Yes Historical Provider, MD  dorzolamide-timolol (COSOPT) 22.3-6.8 MG/ML ophthalmic solution Place 1 drop into both eyes 2 (two) times daily.     Yes Historical Provider, MD  fluocinonide (LIDEX) 0.05 % external solution Apply 1 application topically once a week. scalp 07/27/16  Yes Historical Provider, MD  ipratropium (ATROVENT) 0.03 % nasal spray 1-2 puffs each nostril every 8 hours if needed 01/20/16  Yes Deneise Lever, MD    ipratropium-albuterol (DUONEB) 0.5-2.5 (3) MG/3ML SOLN Take 3 mLs by nebulization every 4 (four) hours as needed. Patient taking differently: Take 3 mLs by nebulization every 6 (six) hours as needed.  08/31/16  Yes Robbie Lis, MD  loratadine (CLARITIN) 10 MG tablet Take 10 mg by mouth daily as needed for allergies.    Yes Historical Provider, MD  Multiple Vitamins-Minerals (PRESERVISION/LUTEIN) CAPS Take 1 capsule by mouth 2 (two) times daily.     Yes Historical Provider, MD  OXYGEN 2LPM   Yes Historical Provider, MD    Vital Signs: BP 125/63 (BP Location: Right Arm)   Pulse 79   Temp 98.3 F (36.8 C) (Oral)   Resp 18   LMP 08/29/1967   SpO2 96%   Physical Exam: Chest: bx site is c/d/i, chest tube site is also c/d/i.  [place] with minimal serosang output.  No air leak.  Imaging: Ct Biopsy  Result Date: 10/03/2016 INDICATION: Indeterminate hypermetabolic right upper lobe pulmonary mass. Please perform CT-guided biopsy for tissue diagnostic purposes. EXAM: 1. CT-GUIDED RIGHT UPPER LOBE PULMONARY MASS BIOPSY 2. CT-GUIDED RIGHT-SIDED CHEST TUBE PLACEMENT COMPARISON:  PET-CT - 09/15/2016; chest CT - 08/20/2016 MEDICATIONS: None. ANESTHESIA/SEDATION: Fentanyl 2 mcg IV; Versed 75 mg IV Sedation time: 44 minutes; The patient was continuously monitored during the procedure by the interventional radiology nurse under my direct supervision. CONTRAST:  None COMPLICATIONS: SIR LEVEL C - Requires therapy, minor hospitalization (<48 hrs). Procedure complicated by development of a asymptomatic  though slowly enlarging right-sided pneumothorax requiring placement of a CT-guided chest tube. PROCEDURE: Informed consent was obtained from the patient following an explanation of the procedure, risks, benefits and alternatives. The patient understands,agrees and consents for the procedure. All questions were addressed. A time out was performed prior to the initiation of the procedure. The patient was positioned  supine on the CT table and a limited chest CT was performed for procedural planning demonstrating unchanged size and appearance of the known macrolobulated approximately 4.3 x 3.4 cm mass within the right upper lobe (image 17, series 2). The operative site was prepped and draped in the usual sterile fashion. Under sterile conditions and local anesthesia, a 17 gauge coaxial needle was advanced into the peripheral aspect of the nodule. Positioning was confirmed with intermittent CT fluoroscopy and followed by the acquisition of 4 core needle biopsy samples with an 18 gauge core needle biopsy device. The coaxial needle was removed and superficial hemostasis was achieved with manual compression. Postprocedural imaging demonstrated development of a small postprocedural pneumothorax. While the patient remained asymptomatic, 15 minute delayed imaging demonstrated interval enlargement of the right-sided pneumothorax and the decision was made to place a CT-guided chest tube given pneumothorax enlargement. The skin overlying the right anterior upper chest was prepped and draped in usual sterile fashion. A 22 gauge needle was utilized for trajectory planning purposes after the overlying soft tissues were anesthetized with 1% lidocaine. An 18 gauge trocar needle was advanced into the right pleural space and Amplatz wire was coiled within the medial aspect of the right hemithorax. Appropriate positioning was confirmed. The track was dilated allowing placement of 10 French percutaneous drainage catheter. The drainage catheter was secured at the skin entrance site within interrupted suture and connected to a Pleur-Evac device. Postprocedural imaging was obtained demonstrating near complete evacuation of the right-sided pneumothorax. Dressings were placed. The patient otherwise tolerated the procedure well immediate postprocedural complication. IMPRESSION: 1. Technically successful CT guided core needle core biopsy of right upper  lobe pulmonary mass biopsy. 2. Procedure complicated by development of a slowly enlarging though asymptomatic pneumothorax necessitating placement of a right-sided chest tube. PLAN: Patient will be admitted for continued observation and chest tube management. Electronically Signed   By: Sandi Mariscal M.D.   On: 10/03/2016 15:02   Dg Chest Port 1 View  Result Date: 10/04/2016 CLINICAL DATA:  Pneumothorax.  Right chest tube. EXAM: PORTABLE CHEST 1 VIEW COMPARISON:  10/03/2016 FINDINGS: Right upper lobe mass is again noted, unchanged. Right chest tube remains in place. No visible pneumothorax. Heart is borderline in size. No acute opacities or effusions. IMPRESSION: Right chest tube remains in stable position without visible pneumothorax. Electronically Signed   By: Rolm Baptise M.D.   On: 10/04/2016 08:31   Dg Chest Port 1 View  Result Date: 10/03/2016 CLINICAL DATA:  Posterior right chest tube for right pneumothorax EXAM: PORTABLE CHEST 1 VIEW COMPARISON:  Chest x-ray 10/03/2016 FINDINGS: Right chest tube remains in place. No right pneumothorax is seen. Masslike opacity in right upper lobe is stable. IMPRESSION: No definite pneumothorax with right chest tube present. Electronically Signed   By: Ivar Drape M.D.   On: 10/03/2016 15:02   Dg Chest Port 1 View  Result Date: 10/03/2016 CLINICAL DATA:  Chest tube in place EXAM: PORTABLE CHEST 1 VIEW COMPARISON:  PET-CT dated 09/15/2016 FINDINGS: Right upper lobe mass. Small bore chest drain overlying the medial right upper lung. No pneumothorax is seen. Left lung is clear. The heart is  normal in size. Thoracic aortic atherosclerosis. IMPRESSION: Small bore chest drain overlying the medial right upper lung. No pneumothorax is seen. Right upper lobe mass. Thoracic aortic atherosclerosis. Electronically Signed   By: Julian Hy M.D.   On: 10/03/2016 14:04   Ct Image Guided Drainage By Percutaneous Catheter  Result Date: 10/03/2016 INDICATION: Indeterminate  hypermetabolic right upper lobe pulmonary mass. Please perform CT-guided biopsy for tissue diagnostic purposes. EXAM: 1. CT-GUIDED RIGHT UPPER LOBE PULMONARY MASS BIOPSY 2. CT-GUIDED RIGHT-SIDED CHEST TUBE PLACEMENT COMPARISON:  PET-CT - 09/15/2016; chest CT - 08/20/2016 MEDICATIONS: None. ANESTHESIA/SEDATION: Fentanyl 2 mcg IV; Versed 75 mg IV Sedation time: 44 minutes; The patient was continuously monitored during the procedure by the interventional radiology nurse under my direct supervision. CONTRAST:  None COMPLICATIONS: SIR LEVEL C - Requires therapy, minor hospitalization (<48 hrs). Procedure complicated by development of a asymptomatic though slowly enlarging right-sided pneumothorax requiring placement of a CT-guided chest tube. PROCEDURE: Informed consent was obtained from the patient following an explanation of the procedure, risks, benefits and alternatives. The patient understands,agrees and consents for the procedure. All questions were addressed. A time out was performed prior to the initiation of the procedure. The patient was positioned supine on the CT table and a limited chest CT was performed for procedural planning demonstrating unchanged size and appearance of the known macrolobulated approximately 4.3 x 3.4 cm mass within the right upper lobe (image 17, series 2). The operative site was prepped and draped in the usual sterile fashion. Under sterile conditions and local anesthesia, a 17 gauge coaxial needle was advanced into the peripheral aspect of the nodule. Positioning was confirmed with intermittent CT fluoroscopy and followed by the acquisition of 4 core needle biopsy samples with an 18 gauge core needle biopsy device. The coaxial needle was removed and superficial hemostasis was achieved with manual compression. Postprocedural imaging demonstrated development of a small postprocedural pneumothorax. While the patient remained asymptomatic, 15 minute delayed imaging demonstrated interval  enlargement of the right-sided pneumothorax and the decision was made to place a CT-guided chest tube given pneumothorax enlargement. The skin overlying the right anterior upper chest was prepped and draped in usual sterile fashion. A 22 gauge needle was utilized for trajectory planning purposes after the overlying soft tissues were anesthetized with 1% lidocaine. An 18 gauge trocar needle was advanced into the right pleural space and Amplatz wire was coiled within the medial aspect of the right hemithorax. Appropriate positioning was confirmed. The track was dilated allowing placement of 10 French percutaneous drainage catheter. The drainage catheter was secured at the skin entrance site within interrupted suture and connected to a Pleur-Evac device. Postprocedural imaging was obtained demonstrating near complete evacuation of the right-sided pneumothorax. Dressings were placed. The patient otherwise tolerated the procedure well immediate postprocedural complication. IMPRESSION: 1. Technically successful CT guided core needle core biopsy of right upper lobe pulmonary mass biopsy. 2. Procedure complicated by development of a slowly enlarging though asymptomatic pneumothorax necessitating placement of a right-sided chest tube. PLAN: Patient will be admitted for continued observation and chest tube management. Electronically Signed   By: Sandi Mariscal M.D.   On: 10/03/2016 15:02    Labs:  CBC:  Recent Labs  08/25/16 0445 08/31/16 0158 10/03/16 0925 10/04/16 0432  WBC 12.0* 10.6* 8.9 7.4  HGB 11.9* 12.3 13.3 12.4  HCT 37.1 37.9 42.5 39.6  PLT 227 349 275 228    COAGS:  Recent Labs  10/03/16 0925  INR 1.02  APTT 28  BMP:  Recent Labs  08/24/16 0153 08/25/16 1157 08/31/16 0158 10/04/16 0432  NA 141 138 137 138  K 4.1 3.9 3.2* 4.4  CL 106 104 101 103  CO2 '27 25 28 25  '$ GLUCOSE 113* 105* 113* 92  BUN '8 8 6 7  '$ CALCIUM 8.8* 8.9 8.8* 8.7*  CREATININE 0.63 0.57 0.52 0.65  GFRNONAA >60  >60 >60 >60  GFRAA >60 >60 >60 >60    LIVER FUNCTION TESTS: No results for input(s): BILITOT, AST, ALT, ALKPHOS, PROT, ALBUMIN in the last 8760 hours.  Assessment and Plan: 1. PTX after lung bx -CXR shows no evidence of PTX, but due to initial size, will keep on suction today and plan to water seal tomorrow am at 0600 and take a CXR at 1000am.  If this looks good then we would hopefully DC her chest tube and plan for DC later in the day if another follow up CXR looks good. -appreciate medicine's assistance with this patient.  Electronically Signed: Henreitta Cea 10/04/2016, 2:16 PM   I spent a total of 15 Minutes at the the patient's bedside AND on the patient's hospital floor or unit, greater than 50% of which was counseling/coordinating care for PTX s/p lung bx

## 2016-10-04 NOTE — Progress Notes (Signed)
PROGRESS NOTE        PATIENT DETAILS Name: Jodi Burns Age: 83 y.o. Sex: female Date of Birth: 1926/01/18 Admit Date: 10/03/2016 Admitting Physician Sandi Mariscal, MD LTJ:[id number],ZRAQ A, MD  Brief Narrative: Patient is a 83 y.o. female with history of atrial fibrillation on anticoagulation-recently found to have a lung mass and subsequently underwent biopsy on 2/6. Unfortunately she developed a pneumothorax postbiopsy and required chest tube placement. [facility] was consulted for management of medical issues.  Subjective: Anxious to go home-apart from soreness at the chest tube site-no other complaints.  Assessment/Plan: Right sided pneumothorax: Occurred following lung biopsy. Being managed by interventional radiology.  Paroxysmal atrial fibrillation: Rate controlled with amiodarone, and hydralazine. Spoke with IR PA-Pamela Turpin over the phone, okay to resume anticoagulation.  Asthma: Stable, clear lungs.Continue Dulera and Albuterol PRN  DVT Prophylaxis: Full dose anticoagulation with Eliquis  Code Status: Full code   Family Communication: Spouse at bedside  Disposition Plan: Per primary service  Antimicrobial agents: Anti-infectives    None     Time spent: 25- minutes-Greater than 50% of this time was spent in counseling, explanation of diagnosis, planning of further management, and coordination of care.  MEDICATIONS: Scheduled Meds: . amiodarone  200 mg Oral Daily  . apixaban  2.5 mg Oral BID  . diltiazem  60 mg Oral Q12H  . dorzolamide-timolol  1 drop Both Eyes BID  . mometasone-formoterol  2 puff Inhalation BID   Continuous Infusions: PRN Meds:.acetaminophen, hydrALAZINE, ipratropium-albuterol, ketorolac   PHYSICAL EXAM: Vital signs: Vitals:   10/04/16 0205 10/04/16 0437 10/04/16 1026 10/04/16 1300  BP: 139/75 102/82 118/65 125/63  Pulse: 76 70 74 79  Resp: 18 18    Temp: 98 F (36.7 C) 97.8 F (36.6 C) 98.2  F (36.8 C) 98.3 F (36.8 C)  TempSrc: Oral Oral Oral Oral  SpO2: 98% 96% 96% 96%   There were no vitals filed for this visit. There is no height or weight on file to calculate BMI.   General appearance :Awake, alert, not in any distress. Speech Clear.  Eyes:, pupils equally reactive to light and accomodation,no scleral icterus. HEENT: Atraumatic and Normocephalic Neck: supple, no JVD. No cervical lymphadenopathy.  Resp:Good air entry bilaterally, no added sounds  CVS: S1 S2 regular, no murmurs.  GI: Bowel sounds present, Non tender and not distended with no gaurding, rigidity or rebound.No organomegaly Extremities: B/L Lower Ext shows no edema, both legs are warm to touch Neurology:  speech clear,Non focal, sensation is grossly intact. Musculoskeletal:No digital cyanosis Skin:No Rash, warm and dry Wounds:N/A  I have personally reviewed following labs and imaging studies  LABORATORY DATA: CBC:  Recent Labs Lab 10/03/16 0925 10/04/16 0432  WBC 8.9 7.4  HGB 13.3 12.4  HCT 42.5 39.6  MCV 95.3 95.9  PLT 275 762    Basic Metabolic Panel:  Recent Labs Lab 10/04/16 0432  NA 138  K 4.4  CL 103  CO2 25  GLUCOSE 92  BUN 7  CREATININE 0.65  CALCIUM 8.7*    GFR: Estimated Creatinine Clearance: 38.4 mL/min (by C-G formula based on SCr of 0.65 mg/dL).  Liver Function Tests: No results for input(s): AST, ALT, ALKPHOS, BILITOT, PROT, ALBUMIN in the last 168 hours. No results for input(s): LIPASE, AMYLASE in the last 168 hours. No results for input(s): AMMONIA in the last 168  hours.  Coagulation Profile:  Recent Labs Lab 10/03/16 0925  INR 1.02    Cardiac Enzymes: No results for input(s): CKTOTAL, CKMB, CKMBINDEX, TROPONINI in the last 168 hours.  BNP (last 3 results) No results for input(s): PROBNP in the last 8760 hours.  HbA1C: No results for input(s): HGBA1C in the last 72 hours.  CBG: No results for input(s): GLUCAP in the last 168 hours.  Lipid  Profile: No results for input(s): CHOL, HDL, LDLCALC, TRIG, CHOLHDL, LDLDIRECT in the last 72 hours.  Thyroid Function Tests: No results for input(s): TSH, T4TOTAL, FREET4, T3FREE, THYROIDAB in the last 72 hours.  Anemia Panel: No results for input(s): VITAMINB12, FOLATE, FERRITIN, TIBC, IRON, RETICCTPCT in the last 72 hours.  Urine analysis:    Component Value Date/Time   COLORURINE YELLOW 08/21/2016 0705   APPEARANCEUR CLEAR 08/21/2016 0705   LABSPEC 1.031 (H) 08/21/2016 0705   PHURINE 5.0 08/21/2016 0705   GLUCOSEU NEGATIVE 08/21/2016 0705   HGBUR NEGATIVE 08/21/2016 0705   BILIRUBINUR NEGATIVE 08/21/2016 0705   KETONESUR NEGATIVE 08/21/2016 0705   PROTEINUR NEGATIVE 08/21/2016 0705   NITRITE NEGATIVE 08/21/2016 0705   LEUKOCYTESUR TRACE (A) 08/21/2016 0705    Sepsis Labs: Lactic Acid, Venous No results found for: LATICACIDVEN  MICROBIOLOGY: No results found for this or any previous visit (from the past 240 hour(s)).  RADIOLOGY STUDIES/RESULTS: Nm Pet Image Initial (pi) Skull Base To Thigh  Result Date: 09/15/2016 CLINICAL DATA:  Initial treatment strategy for lung mass. EXAM: NUCLEAR MEDICINE PET SKULL BASE TO THIGH TECHNIQUE: 6.11 mCi F-18 FDG was injected intravenously. Full-ring PET imaging was performed from the skull base to thigh after the radiotracer. CT data was obtained and used for attenuation correction and anatomic localization. FASTING BLOOD GLUCOSE:  Value: 107 mg/dl COMPARISON:  08/20/2016 FINDINGS: NECK No hypermetabolic lymph nodes in the neck. CHEST The heart size appears normal. Aortic atherosclerosis identified. Calcifications involving the LAD and RCA coronary artery noted. No mediastinal or hilar adenopathy identified. There is a small right pleural effusion. Overlying airspace consolidation which appears hypermetabolic is identified within the right lower lobe. This is favored to represent pneumonia and or aspiration. Right upper lobe mass measures 4 cm  and has an SUV max equal to 34.7. Spiculated density within the scratch set spiculated nodular density in the left upper lobe measures 1.4 cm and has an SUV max equal to 1.65. Mild changes of centrilobular emphysema. ABDOMEN/PELVIS No abnormal hypermetabolic activity within the liver, pancreas, adrenal glands, or spleen. Calcified splenic and liver granulomas. No hypermetabolic lymph nodes in the abdomen or pelvis. SKELETON No focal hypermetabolic activity to suggest skeletal metastasis. IMPRESSION: 1. Mass in the right upper lobe measures 4 cm and exhibits intense FDG uptake, suspicious for primary bronchogenic carcinoma. 2. Small nodule in the left upper lobe exhibits low level malignant range FDG uptake and is worrisome for primary lung neoplasm. 3. No evidence for mediastinal or hilar nodal metastasis or distant metastatic disease. 4. Emphysema 5. Aortic atherosclerosis and coronary artery calcification. 6. Small right pleural effusion. Overlying airspace consolidation within the right lower lobe concerning for pneumonia and/or aspiration. 7. Prior granulomatous disease. Electronically Signed   By: Kerby Moors M.D.   On: 09/15/2016 10:49   Ct Biopsy  Result Date: 10/03/2016 INDICATION: Indeterminate hypermetabolic right upper lobe pulmonary mass. Please perform CT-guided biopsy for tissue diagnostic purposes. EXAM: 1. CT-GUIDED RIGHT UPPER LOBE PULMONARY MASS BIOPSY 2. CT-GUIDED RIGHT-SIDED CHEST TUBE PLACEMENT COMPARISON:  PET-CT - 09/15/2016; chest CT -  08/20/2016 MEDICATIONS: None. ANESTHESIA/SEDATION: Fentanyl 2 mcg IV; Versed 75 mg IV Sedation time: 44 minutes; The patient was continuously monitored during the procedure by the interventional radiology nurse under my direct supervision. CONTRAST:  None COMPLICATIONS: SIR LEVEL C - Requires therapy, minor hospitalization (<48 hrs). Procedure complicated by development of a asymptomatic though slowly enlarging right-sided pneumothorax requiring placement  of a CT-guided chest tube. PROCEDURE: Informed consent was obtained from the patient following an explanation of the procedure, risks, benefits and alternatives. The patient understands,agrees and consents for the procedure. All questions were addressed. A time out was performed prior to the initiation of the procedure. The patient was positioned supine on the CT table and a limited chest CT was performed for procedural planning demonstrating unchanged size and appearance of the known macrolobulated approximately 4.3 x 3.4 cm mass within the right upper lobe (image 17, series 2). The operative site was prepped and draped in the usual sterile fashion. Under sterile conditions and local anesthesia, a 17 gauge coaxial needle was advanced into the peripheral aspect of the nodule. Positioning was confirmed with intermittent CT fluoroscopy and followed by the acquisition of 4 core needle biopsy samples with an 18 gauge core needle biopsy device. The coaxial needle was removed and superficial hemostasis was achieved with manual compression. Postprocedural imaging demonstrated development of a small postprocedural pneumothorax. While the patient remained asymptomatic, 15 minute delayed imaging demonstrated interval enlargement of the right-sided pneumothorax and the decision was made to place a CT-guided chest tube given pneumothorax enlargement. The skin overlying the right anterior upper chest was prepped and draped in usual sterile fashion. A 22 gauge needle was utilized for trajectory planning purposes after the overlying soft tissues were anesthetized with 1% lidocaine. An 18 gauge trocar needle was advanced into the right pleural space and Amplatz wire was coiled within the medial aspect of the right hemithorax. Appropriate positioning was confirmed. The track was dilated allowing placement of 10 French percutaneous drainage catheter. The drainage catheter was secured at the skin entrance site within interrupted  suture and connected to a Pleur-Evac device. Postprocedural imaging was obtained demonstrating near complete evacuation of the right-sided pneumothorax. Dressings were placed. The patient otherwise tolerated the procedure well immediate postprocedural complication. IMPRESSION: 1. Technically successful CT guided core needle core biopsy of right upper lobe pulmonary mass biopsy. 2. Procedure complicated by development of a slowly enlarging though asymptomatic pneumothorax necessitating placement of a right-sided chest tube. PLAN: Patient will be admitted for continued observation and chest tube management. Electronically Signed   By: Sandi Mariscal M.D.   On: 10/03/2016 15:02   Dg Chest Port 1 View  Result Date: 10/04/2016 CLINICAL DATA:  Pneumothorax.  Right chest tube. EXAM: PORTABLE CHEST 1 VIEW COMPARISON:  10/03/2016 FINDINGS: Right upper lobe mass is again noted, unchanged. Right chest tube remains in place. No visible pneumothorax. Heart is borderline in size. No acute opacities or effusions. IMPRESSION: Right chest tube remains in stable position without visible pneumothorax. Electronically Signed   By: Rolm Baptise M.D.   On: 10/04/2016 08:31   Dg Chest Port 1 View  Result Date: 10/03/2016 CLINICAL DATA:  Posterior right chest tube for right pneumothorax EXAM: PORTABLE CHEST 1 VIEW COMPARISON:  Chest x-ray 10/03/2016 FINDINGS: Right chest tube remains in place. No right pneumothorax is seen. Masslike opacity in right upper lobe is stable. IMPRESSION: No definite pneumothorax with right chest tube present. Electronically Signed   By: Ivar Drape M.D.   On: 10/03/2016 15:02  Dg Chest Port 1 View  Result Date: 10/03/2016 CLINICAL DATA:  Chest tube in place EXAM: PORTABLE CHEST 1 VIEW COMPARISON:  PET-CT dated 09/15/2016 FINDINGS: Right upper lobe mass. Small bore chest drain overlying the medial right upper lung. No pneumothorax is seen. Left lung is clear. The heart is normal in size. Thoracic aortic  atherosclerosis. IMPRESSION: Small bore chest drain overlying the medial right upper lung. No pneumothorax is seen. Right upper lobe mass. Thoracic aortic atherosclerosis. Electronically Signed   By: Julian Hy M.D.   On: 10/03/2016 14:04   Ct Image Guided Drainage By Percutaneous Catheter  Result Date: 10/03/2016 INDICATION: Indeterminate hypermetabolic right upper lobe pulmonary mass. Please perform CT-guided biopsy for tissue diagnostic purposes. EXAM: 1. CT-GUIDED RIGHT UPPER LOBE PULMONARY MASS BIOPSY 2. CT-GUIDED RIGHT-SIDED CHEST TUBE PLACEMENT COMPARISON:  PET-CT - 09/15/2016; chest CT - 08/20/2016 MEDICATIONS: None. ANESTHESIA/SEDATION: Fentanyl 2 mcg IV; Versed 75 mg IV Sedation time: 44 minutes; The patient was continuously monitored during the procedure by the interventional radiology nurse under my direct supervision. CONTRAST:  None COMPLICATIONS: SIR LEVEL C - Requires therapy, minor hospitalization (<48 hrs). Procedure complicated by development of a asymptomatic though slowly enlarging right-sided pneumothorax requiring placement of a CT-guided chest tube. PROCEDURE: Informed consent was obtained from the patient following an explanation of the procedure, risks, benefits and alternatives. The patient understands,agrees and consents for the procedure. All questions were addressed. A time out was performed prior to the initiation of the procedure. The patient was positioned supine on the CT table and a limited chest CT was performed for procedural planning demonstrating unchanged size and appearance of the known macrolobulated approximately 4.3 x 3.4 cm mass within the right upper lobe (image 17, series 2). The operative site was prepped and draped in the usual sterile fashion. Under sterile conditions and local anesthesia, a 17 gauge coaxial needle was advanced into the peripheral aspect of the nodule. Positioning was confirmed with intermittent CT fluoroscopy and followed by the  acquisition of 4 core needle biopsy samples with an 18 gauge core needle biopsy device. The coaxial needle was removed and superficial hemostasis was achieved with manual compression. Postprocedural imaging demonstrated development of a small postprocedural pneumothorax. While the patient remained asymptomatic, 15 minute delayed imaging demonstrated interval enlargement of the right-sided pneumothorax and the decision was made to place a CT-guided chest tube given pneumothorax enlargement. The skin overlying the right anterior upper chest was prepped and draped in usual sterile fashion. A 22 gauge needle was utilized for trajectory planning purposes after the overlying soft tissues were anesthetized with 1% lidocaine. An 18 gauge trocar needle was advanced into the right pleural space and Amplatz wire was coiled within the medial aspect of the right hemithorax. Appropriate positioning was confirmed. The track was dilated allowing placement of 10 French percutaneous drainage catheter. The drainage catheter was secured at the skin entrance site within interrupted suture and connected to a Pleur-Evac device. Postprocedural imaging was obtained demonstrating near complete evacuation of the right-sided pneumothorax. Dressings were placed. The patient otherwise tolerated the procedure well immediate postprocedural complication. IMPRESSION: 1. Technically successful CT guided core needle core biopsy of right upper lobe pulmonary mass biopsy. 2. Procedure complicated by development of a slowly enlarging though asymptomatic pneumothorax necessitating placement of a right-sided chest tube. PLAN: Patient will be admitted for continued observation and chest tube management. Electronically Signed   By: Sandi Mariscal M.D.   On: 10/03/2016 15:02     LOS: 0 days  Oren Binet, MD  [facility] Pager:[phone]  If 7PM-7AM, please contact night-coverage [email or web address] Password [email or web address] 10/04/2016, 4:05 PM

## 2016-10-05 ENCOUNTER — Inpatient Hospital Stay (HOSPITAL_COMMUNITY): Payer: Medicare Other

## 2016-10-05 DIAGNOSIS — I10 Essential (primary) hypertension: Secondary | ICD-10-CM

## 2016-10-05 DIAGNOSIS — E785 Hyperlipidemia, unspecified: Secondary | ICD-10-CM

## 2016-10-05 NOTE — Care Management Note (Signed)
Case Management Note  Patient Details  Name: Jodi Burns MRN: [id number] Date of Birth: [birth year]  Subjective/Objective:                    Action/Plan:  Prior to admission patient active with [facility] . Will need resumption of care orders , if not re ordered in hospital [facility] staff will call PCP Expected Discharge Date:                  Expected Discharge Plan:  [place]  In-House Referral:     Discharge planning Services     Post Acute Care Choice:  [place] Choice offered to:     DME Arranged:    DME Agency:     HH Arranged:  RN, PT, OT HH Agency:  [facility] (now [facility])  Status of Service:  In process, will continue to follow  If discussed at Long Length of Stay Meetings, dates discussed:    Additional Comments:  Marilu Favre, RN 10/05/2016, 11:36 AM

## 2016-10-05 NOTE — Progress Notes (Addendum)
PROGRESS NOTE        PATIENT DETAILS Name: Jodi Burns Age: 83 y.o. Sex: female Date of Birth: [birth year] Admit Date: 10/03/2016 Admitting Physician Sandi Mariscal, MD SNK:[id number],HALP A, MD  Brief Narrative: Patient is a 83 y.o. female with history of atrial fibrillation on anticoagulation-recently found to have a lung mass and subsequently underwent biopsy on 2/6. Unfortunately she developed a pneumothorax postbiopsy and required chest tube placement. [facility] hospitalist was consulted for management of medical issues.  Subjective: Anxious to go home-no complaints this morning  Assessment/Plan: Right sided pneumothorax: Occurred following lung biopsy. Being managed by interventional radiology.  Lung mass: Biopsy confirms adenocarcinoma-we will need outpatient oncology evaluation-deferred to primary service  Paroxysmal atrial fibrillation: Rate controlled with amiodarone, and hydralazine. After speaking with interventional radiology, anticoagulation was resumed on 2/7.  Asthma: Stable, clear lungs.Continue Dulera and Albuterol PRN  No further input-stable for discharge from my point of view  DVT Prophylaxis: Full dose anticoagulation with Eliquis  Code Status: Full code   Family Communication: None at bedside  Disposition Plan: Per primary service  Antimicrobial agents: Anti-infectives    None     Time spent: 25- minutes-Greater than 50% of this time was spent in counseling, explanation of diagnosis, planning of further management, and coordination of care.  MEDICATIONS: Scheduled Meds: . amiodarone  200 mg Oral Daily  . apixaban  2.5 mg Oral BID  . diltiazem  60 mg Oral Q12H  . dorzolamide-timolol  1 drop Both Eyes BID  . mometasone-formoterol  2 puff Inhalation BID   Continuous Infusions: PRN Meds:.acetaminophen, hydrALAZINE, ipratropium-albuterol, ketorolac   PHYSICAL EXAM: Vital signs: Vitals:   10/04/16 2104 10/05/16 0229  10/05/16 0501 10/05/16 0948  BP:  (!) 131/53 (!) 141/68 (!) 138/53  Pulse:  80 84 78  Resp:  '18 18 16  '$ Temp:  98.5 F (36.9 C) 98.7 F (37.1 C) 98.6 F (37 C)  TempSrc:  Oral Oral Oral  SpO2: 96% 96% 94% 98%   There were no vitals filed for this visit. There is no height or weight on file to calculate BMI.   General appearance :Awake, alert, not in any distress. Speech Clear.  Eyes:, pupils equally reactive to light and accomodation,no scleral icterus. HEENT: Atraumatic and Normocephalic Neck: supple, no JVD. No cervical lymphadenopathy.  Resp:Good air entry bilaterally, no added sounds  CVS: S1 S2 regular, no murmurs.  GI: Bowel sounds present, Non tender and not distended with no gaurding, rigidity or rebound.No organomegaly Extremities: B/L Lower Ext shows no edema, both legs are warm to touch Neurology:  speech clear,Non focal, sensation is grossly intact. Musculoskeletal:No digital cyanosis Skin:No Rash, warm and dry Wounds:N/A  I have personally reviewed following labs and imaging studies  LABORATORY DATA: CBC:  Recent Labs Lab 10/03/16 0925 10/04/16 0432  WBC 8.9 7.4  HGB 13.3 12.4  HCT 42.5 39.6  MCV 95.3 95.9  PLT 275 379    Basic Metabolic Panel:  Recent Labs Lab 10/04/16 0432  NA 138  K 4.4  CL 103  CO2 25  GLUCOSE 92  BUN 7  CREATININE 0.65  CALCIUM 8.7*    GFR: Estimated Creatinine Clearance: 38.4 mL/min (by C-G formula based on SCr of 0.65 mg/dL).  Liver Function Tests: No results for input(s): AST, ALT, ALKPHOS, BILITOT, PROT, ALBUMIN in the last 168 hours. No results for  input(s): LIPASE, AMYLASE in the last 168 hours. No results for input(s): AMMONIA in the last 168 hours.  Coagulation Profile:  Recent Labs Lab 10/03/16 0925  INR 1.02    Cardiac Enzymes: No results for input(s): CKTOTAL, CKMB, CKMBINDEX, TROPONINI in the last 168 hours.  BNP (last 3 results) No results for input(s): PROBNP in the last 8760  hours.  HbA1C: No results for input(s): HGBA1C in the last 72 hours.  CBG: No results for input(s): GLUCAP in the last 168 hours.  Lipid Profile: No results for input(s): CHOL, HDL, LDLCALC, TRIG, CHOLHDL, LDLDIRECT in the last 72 hours.  Thyroid Function Tests: No results for input(s): TSH, T4TOTAL, FREET4, T3FREE, THYROIDAB in the last 72 hours.  Anemia Panel: No results for input(s): VITAMINB12, FOLATE, FERRITIN, TIBC, IRON, RETICCTPCT in the last 72 hours.  Urine analysis:    Component Value Date/Time   COLORURINE YELLOW 08/21/2016 0705   APPEARANCEUR CLEAR 08/21/2016 0705   LABSPEC 1.031 (H) 08/21/2016 0705   PHURINE 5.0 08/21/2016 0705   GLUCOSEU NEGATIVE 08/21/2016 0705   HGBUR NEGATIVE 08/21/2016 0705   BILIRUBINUR NEGATIVE 08/21/2016 0705   KETONESUR NEGATIVE 08/21/2016 0705   PROTEINUR NEGATIVE 08/21/2016 0705   NITRITE NEGATIVE 08/21/2016 0705   LEUKOCYTESUR TRACE (A) 08/21/2016 0705    Sepsis Labs: Lactic Acid, Venous No results found for: LATICACIDVEN  MICROBIOLOGY: No results found for this or any previous visit (from the past 240 hour(s)).  RADIOLOGY STUDIES/RESULTS: Dg Chest 1 View  Result Date: 10/05/2016 CLINICAL DATA:  Pneumothorax after biopsy.  Follow-up. EXAM: CHEST 1 VIEW COMPARISON:  10/24/2016 FINDINGS: Right chest tube again noted in place. No visible pneumothorax. Right upper lobe mass again noted. Heart is borderline in size. No focal opacity on the left. IMPRESSION: Right chest tube remains in place without visible pneumothorax. Electronically Signed   By: Rolm Baptise M.D.   On: 10/05/2016 11:37   Nm Pet Image Initial (pi) Skull Base To Thigh  Result Date: 09/15/2016 CLINICAL DATA:  Initial treatment strategy for lung mass. EXAM: NUCLEAR MEDICINE PET SKULL BASE TO THIGH TECHNIQUE: 6.11 mCi F-18 FDG was injected intravenously. Full-ring PET imaging was performed from the skull base to thigh after the radiotracer. CT data was obtained and used  for attenuation correction and anatomic localization. FASTING BLOOD GLUCOSE:  Value: 107 mg/dl COMPARISON:  08/20/2016 FINDINGS: NECK No hypermetabolic lymph nodes in the neck. CHEST The heart size appears normal. Aortic atherosclerosis identified. Calcifications involving the LAD and RCA coronary artery noted. No mediastinal or hilar adenopathy identified. There is a small right pleural effusion. Overlying airspace consolidation which appears hypermetabolic is identified within the right lower lobe. This is favored to represent pneumonia and or aspiration. Right upper lobe mass measures 4 cm and has an SUV max equal to 34.7. Spiculated density within the scratch set spiculated nodular density in the left upper lobe measures 1.4 cm and has an SUV max equal to 1.65. Mild changes of centrilobular emphysema. ABDOMEN/PELVIS No abnormal hypermetabolic activity within the liver, pancreas, adrenal glands, or spleen. Calcified splenic and liver granulomas. No hypermetabolic lymph nodes in the abdomen or pelvis. SKELETON No focal hypermetabolic activity to suggest skeletal metastasis. IMPRESSION: 1. Mass in the right upper lobe measures 4 cm and exhibits intense FDG uptake, suspicious for primary bronchogenic carcinoma. 2. Small nodule in the left upper lobe exhibits low level malignant range FDG uptake and is worrisome for primary lung neoplasm. 3. No evidence for mediastinal or hilar nodal metastasis or distant  metastatic disease. 4. Emphysema 5. Aortic atherosclerosis and coronary artery calcification. 6. Small right pleural effusion. Overlying airspace consolidation within the right lower lobe concerning for pneumonia and/or aspiration. 7. Prior granulomatous disease. Electronically Signed   By: Kerby Moors M.D.   On: 09/15/2016 10:49   Ct Biopsy  Result Date: 10/03/2016 INDICATION: Indeterminate hypermetabolic right upper lobe pulmonary mass. Please perform CT-guided biopsy for tissue diagnostic purposes. EXAM: 1.  CT-GUIDED RIGHT UPPER LOBE PULMONARY MASS BIOPSY 2. CT-GUIDED RIGHT-SIDED CHEST TUBE PLACEMENT COMPARISON:  PET-CT - 09/15/2016; chest CT - 08/20/2016 MEDICATIONS: None. ANESTHESIA/SEDATION: Fentanyl 2 mcg IV; Versed 75 mg IV Sedation time: 44 minutes; The patient was continuously monitored during the procedure by the interventional radiology nurse under my direct supervision. CONTRAST:  None COMPLICATIONS: SIR LEVEL C - Requires therapy, minor hospitalization (<48 hrs). Procedure complicated by development of a asymptomatic though slowly enlarging right-sided pneumothorax requiring placement of a CT-guided chest tube. PROCEDURE: Informed consent was obtained from the patient following an explanation of the procedure, risks, benefits and alternatives. The patient understands,agrees and consents for the procedure. All questions were addressed. A time out was performed prior to the initiation of the procedure. The patient was positioned supine on the CT table and a limited chest CT was performed for procedural planning demonstrating unchanged size and appearance of the known macrolobulated approximately 4.3 x 3.4 cm mass within the right upper lobe (image 17, series 2). The operative site was prepped and draped in the usual sterile fashion. Under sterile conditions and local anesthesia, a 17 gauge coaxial needle was advanced into the peripheral aspect of the nodule. Positioning was confirmed with intermittent CT fluoroscopy and followed by the acquisition of 4 core needle biopsy samples with an 18 gauge core needle biopsy device. The coaxial needle was removed and superficial hemostasis was achieved with manual compression. Postprocedural imaging demonstrated development of a small postprocedural pneumothorax. While the patient remained asymptomatic, 15 minute delayed imaging demonstrated interval enlargement of the right-sided pneumothorax and the decision was made to place a CT-guided chest tube given pneumothorax  enlargement. The skin overlying the right anterior upper chest was prepped and draped in usual sterile fashion. A 22 gauge needle was utilized for trajectory planning purposes after the overlying soft tissues were anesthetized with 1% lidocaine. An 18 gauge trocar needle was advanced into the right pleural space and Amplatz wire was coiled within the medial aspect of the right hemithorax. Appropriate positioning was confirmed. The track was dilated allowing placement of 10 French percutaneous drainage catheter. The drainage catheter was secured at the skin entrance site within interrupted suture and connected to a Pleur-Evac device. Postprocedural imaging was obtained demonstrating near complete evacuation of the right-sided pneumothorax. Dressings were placed. The patient otherwise tolerated the procedure well immediate postprocedural complication. IMPRESSION: 1. Technically successful CT guided core needle core biopsy of right upper lobe pulmonary mass biopsy. 2. Procedure complicated by development of a slowly enlarging though asymptomatic pneumothorax necessitating placement of a right-sided chest tube. PLAN: Patient will be admitted for continued observation and chest tube management. Electronically Signed   By: Sandi Mariscal M.D.   On: 10/03/2016 15:02   Dg Chest Port 1 View  Result Date: 10/04/2016 CLINICAL DATA:  Pneumothorax.  Right chest tube. EXAM: PORTABLE CHEST 1 VIEW COMPARISON:  10/03/2016 FINDINGS: Right upper lobe mass is again noted, unchanged. Right chest tube remains in place. No visible pneumothorax. Heart is borderline in size. No acute opacities or effusions. IMPRESSION: Right chest tube remains  in stable position without visible pneumothorax. Electronically Signed   By: Rolm Baptise M.D.   On: 10/04/2016 08:31   Dg Chest Port 1 View  Result Date: 10/03/2016 CLINICAL DATA:  Posterior right chest tube for right pneumothorax EXAM: PORTABLE CHEST 1 VIEW COMPARISON:  Chest x-ray 10/03/2016  FINDINGS: Right chest tube remains in place. No right pneumothorax is seen. Masslike opacity in right upper lobe is stable. IMPRESSION: No definite pneumothorax with right chest tube present. Electronically Signed   By: Ivar Drape M.D.   On: 10/03/2016 15:02   Dg Chest Port 1 View  Result Date: 10/03/2016 CLINICAL DATA:  Chest tube in place EXAM: PORTABLE CHEST 1 VIEW COMPARISON:  PET-CT dated 09/15/2016 FINDINGS: Right upper lobe mass. Small bore chest drain overlying the medial right upper lung. No pneumothorax is seen. Left lung is clear. The heart is normal in size. Thoracic aortic atherosclerosis. IMPRESSION: Small bore chest drain overlying the medial right upper lung. No pneumothorax is seen. Right upper lobe mass. Thoracic aortic atherosclerosis. Electronically Signed   By: Julian Hy M.D.   On: 10/03/2016 14:04   Ct Image Guided Drainage By Percutaneous Catheter  Result Date: 10/03/2016 INDICATION: Indeterminate hypermetabolic right upper lobe pulmonary mass. Please perform CT-guided biopsy for tissue diagnostic purposes. EXAM: 1. CT-GUIDED RIGHT UPPER LOBE PULMONARY MASS BIOPSY 2. CT-GUIDED RIGHT-SIDED CHEST TUBE PLACEMENT COMPARISON:  PET-CT - 09/15/2016; chest CT - 08/20/2016 MEDICATIONS: None. ANESTHESIA/SEDATION: Fentanyl 2 mcg IV; Versed 75 mg IV Sedation time: 44 minutes; The patient was continuously monitored during the procedure by the interventional radiology nurse under my direct supervision. CONTRAST:  None COMPLICATIONS: SIR LEVEL C - Requires therapy, minor hospitalization (<48 hrs). Procedure complicated by development of a asymptomatic though slowly enlarging right-sided pneumothorax requiring placement of a CT-guided chest tube. PROCEDURE: Informed consent was obtained from the patient following an explanation of the procedure, risks, benefits and alternatives. The patient understands,agrees and consents for the procedure. All questions were addressed. A time out was performed  prior to the initiation of the procedure. The patient was positioned supine on the CT table and a limited chest CT was performed for procedural planning demonstrating unchanged size and appearance of the known macrolobulated approximately 4.3 x 3.4 cm mass within the right upper lobe (image 17, series 2). The operative site was prepped and draped in the usual sterile fashion. Under sterile conditions and local anesthesia, a 17 gauge coaxial needle was advanced into the peripheral aspect of the nodule. Positioning was confirmed with intermittent CT fluoroscopy and followed by the acquisition of 4 core needle biopsy samples with an 18 gauge core needle biopsy device. The coaxial needle was removed and superficial hemostasis was achieved with manual compression. Postprocedural imaging demonstrated development of a small postprocedural pneumothorax. While the patient remained asymptomatic, 15 minute delayed imaging demonstrated interval enlargement of the right-sided pneumothorax and the decision was made to place a CT-guided chest tube given pneumothorax enlargement. The skin overlying the right anterior upper chest was prepped and draped in usual sterile fashion. A 22 gauge needle was utilized for trajectory planning purposes after the overlying soft tissues were anesthetized with 1% lidocaine. An 18 gauge trocar needle was advanced into the right pleural space and Amplatz wire was coiled within the medial aspect of the right hemithorax. Appropriate positioning was confirmed. The track was dilated allowing placement of 10 French percutaneous drainage catheter. The drainage catheter was secured at the skin entrance site within interrupted suture and connected to a Pleur-Evac  device. Postprocedural imaging was obtained demonstrating near complete evacuation of the right-sided pneumothorax. Dressings were placed. The patient otherwise tolerated the procedure well immediate postprocedural complication. IMPRESSION: 1.  Technically successful CT guided core needle core biopsy of right upper lobe pulmonary mass biopsy. 2. Procedure complicated by development of a slowly enlarging though asymptomatic pneumothorax necessitating placement of a right-sided chest tube. PLAN: Patient will be admitted for continued observation and chest tube management. Electronically Signed   By: Sandi Mariscal M.D.   On: 10/03/2016 15:02     LOS: 1 day   Oren Binet, MD  [facility] Pager:[phone]  If 7PM-7AM, please contact night-coverage [email or web address] Password [email or web address] 10/05/2016, 11:46 AM

## 2016-10-05 NOTE — Progress Notes (Signed)
Pt. Walked in halls with walker. She did fine. Tolerated well.

## 2016-10-05 NOTE — Discharge Summary (Signed)
Patient ID: Jodi Burns MRN: [id number] DOB/AGE: [birth year] 81 y.o.  Admit date: 10/03/2016 Discharge date: 10/05/2016  Supervising Physician: Markus Daft  Patient Status: [facility] - In-pt  Admission Diagnoses: (R)pneumothorax after lung nodule biopsy  Discharge Diagnoses:  Active Problems:   Hyperlipidemia with target LDL less than 130   PAF (paroxysmal atrial fibrillation) (HCC)   Essential hypertension   Lung mass   COPD GOLD II   Pneumothorax after biopsy   Asthma   Chest tube in place   Discharged Condition: good  Hospital Course: Pt is 83 yo female scheduled for biopsy of right lung lesion. Underwent biopsy on 2/6. Developed pneumothorax and had pigtail chest tube placed. She has done well. PTX resolved with 24 hours of suction. Then she was placed to water seal and tolerated that. Follow up CXR while on water seal show no recurrence of PTX. Her chest tube was removed without difficulty. Final chest xray after tube removal is stable, no ptx. She is determined stable for discharge.   Consults: Internal Medicine  Discharge Exam: Blood pressure 117/61, pulse 78, temperature 98.2 F (36.8 C), temperature source Oral, resp. rate 20, last menstrual period 08/29/1967, SpO2 98 %. Lungs: CTA Biopsy site soft, no hematoma.  Disposition: 06-Home-Health Care Svc  Discharge Instructions    Call MD for:  difficulty breathing, headache or visual disturbances    Complete by:  As directed    Call MD for:  severe uncontrolled pain    Complete by:  As directed    Call MD for:  temperature >100.4    Complete by:  As directed    Diet - low sodium heart healthy    Complete by:  As directed    Increase activity slowly    Complete by:  As directed      Allergies as of 10/05/2016      Reactions   Cefaclor    REACTION: gi upset   Codeine Nausea And Vomiting      Medication List    TAKE these medications   acetaminophen 325 MG tablet Commonly known as:  TYLENOL Take 650 mg  by mouth every 6 (six) hours as needed for mild pain.   amiodarone 200 MG tablet Commonly known as:  PACERONE Take 1 tablet (200 mg total) by mouth daily.   apixaban 2.5 MG Tabs tablet Commonly known as:  ELIQUIS Take 1 tablet (2.5 mg total) by mouth 2 (two) times daily.   budesonide-formoterol 160-4.5 MCG/ACT inhaler Commonly known as:  SYMBICORT Inhale 2 puffs into the lungs 2 (two) times daily. Rinse mouth What changed:  how much to take  additional instructions   CoQ10 200 MG Caps Take 1 capsule by mouth daily.   cyanocobalamin 1000 MCG tablet Take 2,000 mcg by mouth daily.   diltiazem 60 MG 12 hr capsule Commonly known as:  CARDIZEM SR Take 60 mg by mouth 2 (two) times daily.   dorzolamide-timolol 22.3-6.8 MG/ML ophthalmic solution Commonly known as:  COSOPT Place 1 drop into both eyes 2 (two) times daily.   fluocinonide 0.05 % external solution Commonly known as:  LIDEX Apply 1 application topically once a week. scalp   ipratropium 0.03 % nasal spray Commonly known as:  ATROVENT 1-2 puffs each nostril every 8 hours if needed   ipratropium-albuterol 0.5-2.5 (3) MG/3ML Soln Commonly known as:  DUONEB Take 3 mLs by nebulization every 4 (four) hours as needed. What changed:  when to take this   loratadine 10 MG tablet  Commonly known as:  CLARITIN Take 10 mg by mouth daily as needed for allergies.   OXYGEN 2LPM   PRESERVISION/LUTEIN Caps Take 1 capsule by mouth 2 (two) times daily.   Vitamin D3 5000 units Tabs Take 2 tablets by mouth daily.         Electronically Signed: Ascencion Dike 10/05/2016, 2:42 PM   I have spent Less Than 30 Minutes discharging Jodi Burns.

## 2016-10-05 NOTE — Progress Notes (Signed)
Per MD pt. Would like to walk with walker before going home. No need to call MD back. Pt. Can leave after walking.

## 2016-10-05 NOTE — Progress Notes (Signed)
Has been on water seal all morning. No SOB CXR stable, no recurrence of PTX  Chest tube removed at bedside. Will repeat CXR at 1330, if stable, will DC home.  Ascencion Dike PA-C Interventional Radiology 10/05/2016 11:42 AM

## 2016-10-05 NOTE — Progress Notes (Addendum)
D/C papers gone over with pt. And her husband. No questions/complaints. IV taken out by Student Nurse and Instructor. No prescriptions to give to pt. No questions/complaints. Pt. D/c'd succesfully via w/c.

## 2016-10-05 NOTE — Progress Notes (Signed)
Referring Physician(s): [place]  Supervising Physician: Markus Daft  Patient Status:  [facility] - In-pt  Chief Complaint:  Right lung mass biopsy 2/6 Post pneumothorax requiring chest tube placement Admitted for observastion  Subjective:  Pt feels good this am Only minimal pain at Rt chest tube site Water sealed per RN at 6 am today For repeat CXR at 1000 am Plan would be to remove chest tube and dc home if all is well on CXR at 1000  Allergies: Cefaclor and Codeine  Medications: Prior to Admission medications   Medication Sig Start Date End Date Taking? Authorizing Provider  acetaminophen (TYLENOL) 325 MG tablet Take 650 mg by mouth every 6 (six) hours as needed for mild pain.    Yes Historical Provider, MD  amiodarone (PACERONE) 200 MG tablet Take 1 tablet (200 mg total) by mouth daily. 09/11/16  Yes Robbie Lis, MD  apixaban (ELIQUIS) 2.5 MG TABS tablet Take 1 tablet (2.5 mg total) by mouth 2 (two) times daily. 08/25/16  Yes Nita Sells, MD  budesonide-formoterol (SYMBICORT) 160-4.5 MCG/ACT inhaler Inhale 2 puffs into the lungs 2 (two) times daily. Rinse mouth Patient taking differently: Inhale 1 puff into the lungs 2 (two) times daily. Rinse mouth 03/22/16 04/06/17 Yes Clinton D Young, MD  Cholecalciferol (VITAMIN D3) 5000 units TABS Take 2 tablets by mouth daily.   Yes Historical Provider, MD  Coenzyme Q10 (COQ10) 200 MG CAPS Take 1 capsule by mouth daily.    Yes Historical Provider, MD  cyanocobalamin 1000 MCG tablet Take 2,000 mcg by mouth daily.   Yes Historical Provider, MD  diltiazem (CARDIZEM SR) 60 MG 12 hr capsule Take 60 mg by mouth 2 (two) times daily.   Yes Historical Provider, MD  dorzolamide-timolol (COSOPT) 22.3-6.8 MG/ML ophthalmic solution Place 1 drop into both eyes 2 (two) times daily.     Yes Historical Provider, MD  fluocinonide (LIDEX) 0.05 % external solution Apply 1 application topically once a week. scalp 07/27/16  Yes  Historical Provider, MD  ipratropium (ATROVENT) 0.03 % nasal spray 1-2 puffs each nostril every 8 hours if needed 01/20/16  Yes Deneise Lever, MD  ipratropium-albuterol (DUONEB) 0.5-2.5 (3) MG/3ML SOLN Take 3 mLs by nebulization every 4 (four) hours as needed. Patient taking differently: Take 3 mLs by nebulization every 6 (six) hours as needed.  08/31/16  Yes Robbie Lis, MD  loratadine (CLARITIN) 10 MG tablet Take 10 mg by mouth daily as needed for allergies.    Yes Historical Provider, MD  Multiple Vitamins-Minerals (PRESERVISION/LUTEIN) CAPS Take 1 capsule by mouth 2 (two) times daily.     Yes Historical Provider, MD  OXYGEN 2LPM   Yes Historical Provider, MD     Vital Signs: BP (!) 141/68 (BP Location: Right Arm)   Pulse 84   Temp 98.7 F (37.1 C) (Oral)   Resp 18   LMP 08/29/1967   SpO2 94%   Physical Exam  Pulmonary/Chest: Effort normal and breath sounds normal. She has no wheezes.  Skin: Skin is warm and dry.  Rt chest tube site is clean and dry' NT; no bleeding 100 cc in chest tube drain pleurvac Serous fluid No air leak    Imaging: Ct Biopsy  Result Date: 10/03/2016 INDICATION: Indeterminate hypermetabolic right upper lobe pulmonary mass. Please perform CT-guided biopsy for tissue diagnostic purposes. EXAM: 1. CT-GUIDED RIGHT UPPER LOBE PULMONARY MASS BIOPSY 2. CT-GUIDED RIGHT-SIDED CHEST TUBE PLACEMENT COMPARISON:  PET-CT - 09/15/2016; chest CT - 08/20/2016 MEDICATIONS:  None. ANESTHESIA/SEDATION: Fentanyl 2 mcg IV; Versed 75 mg IV Sedation time: 44 minutes; The patient was continuously monitored during the procedure by the interventional radiology nurse under my direct supervision. CONTRAST:  None COMPLICATIONS: SIR LEVEL C - Requires therapy, minor hospitalization (<48 hrs). Procedure complicated by development of a asymptomatic though slowly enlarging right-sided pneumothorax requiring placement of a CT-guided chest tube. PROCEDURE: Informed consent was obtained from the  patient following an explanation of the procedure, risks, benefits and alternatives. The patient understands,agrees and consents for the procedure. All questions were addressed. A time out was performed prior to the initiation of the procedure. The patient was positioned supine on the CT table and a limited chest CT was performed for procedural planning demonstrating unchanged size and appearance of the known macrolobulated approximately 4.3 x 3.4 cm mass within the right upper lobe (image 17, series 2). The operative site was prepped and draped in the usual sterile fashion. Under sterile conditions and local anesthesia, a 17 gauge coaxial needle was advanced into the peripheral aspect of the nodule. Positioning was confirmed with intermittent CT fluoroscopy and followed by the acquisition of 4 core needle biopsy samples with an 18 gauge core needle biopsy device. The coaxial needle was removed and superficial hemostasis was achieved with manual compression. Postprocedural imaging demonstrated development of a small postprocedural pneumothorax. While the patient remained asymptomatic, 15 minute delayed imaging demonstrated interval enlargement of the right-sided pneumothorax and the decision was made to place a CT-guided chest tube given pneumothorax enlargement. The skin overlying the right anterior upper chest was prepped and draped in usual sterile fashion. A 22 gauge needle was utilized for trajectory planning purposes after the overlying soft tissues were anesthetized with 1% lidocaine. An 18 gauge trocar needle was advanced into the right pleural space and Amplatz wire was coiled within the medial aspect of the right hemithorax. Appropriate positioning was confirmed. The track was dilated allowing placement of 10 French percutaneous drainage catheter. The drainage catheter was secured at the skin entrance site within interrupted suture and connected to a Pleur-Evac device. Postprocedural imaging was obtained  demonstrating near complete evacuation of the right-sided pneumothorax. Dressings were placed. The patient otherwise tolerated the procedure well immediate postprocedural complication. IMPRESSION: 1. Technically successful CT guided core needle core biopsy of right upper lobe pulmonary mass biopsy. 2. Procedure complicated by development of a slowly enlarging though asymptomatic pneumothorax necessitating placement of a right-sided chest tube. PLAN: Patient will be admitted for continued observation and chest tube management. Electronically Signed   By: Sandi Mariscal M.D.   On: 10/03/2016 15:02   Dg Chest Port 1 View  Result Date: 10/04/2016 CLINICAL DATA:  Pneumothorax.  Right chest tube. EXAM: PORTABLE CHEST 1 VIEW COMPARISON:  10/03/2016 FINDINGS: Right upper lobe mass is again noted, unchanged. Right chest tube remains in place. No visible pneumothorax. Heart is borderline in size. No acute opacities or effusions. IMPRESSION: Right chest tube remains in stable position without visible pneumothorax. Electronically Signed   By: Rolm Baptise M.D.   On: 10/04/2016 08:31   Dg Chest Port 1 View  Result Date: 10/03/2016 CLINICAL DATA:  Posterior right chest tube for right pneumothorax EXAM: PORTABLE CHEST 1 VIEW COMPARISON:  Chest x-ray 10/03/2016 FINDINGS: Right chest tube remains in place. No right pneumothorax is seen. Masslike opacity in right upper lobe is stable. IMPRESSION: No definite pneumothorax with right chest tube present. Electronically Signed   By: Ivar Drape M.D.   On: 10/03/2016 15:02  Dg Chest Port 1 View  Result Date: 10/03/2016 CLINICAL DATA:  Chest tube in place EXAM: PORTABLE CHEST 1 VIEW COMPARISON:  PET-CT dated 09/15/2016 FINDINGS: Right upper lobe mass. Small bore chest drain overlying the medial right upper lung. No pneumothorax is seen. Left lung is clear. The heart is normal in size. Thoracic aortic atherosclerosis. IMPRESSION: Small bore chest drain overlying the medial right  upper lung. No pneumothorax is seen. Right upper lobe mass. Thoracic aortic atherosclerosis. Electronically Signed   By: Julian Hy M.D.   On: 10/03/2016 14:04   Ct Image Guided Drainage By Percutaneous Catheter  Result Date: 10/03/2016 INDICATION: Indeterminate hypermetabolic right upper lobe pulmonary mass. Please perform CT-guided biopsy for tissue diagnostic purposes. EXAM: 1. CT-GUIDED RIGHT UPPER LOBE PULMONARY MASS BIOPSY 2. CT-GUIDED RIGHT-SIDED CHEST TUBE PLACEMENT COMPARISON:  PET-CT - 09/15/2016; chest CT - 08/20/2016 MEDICATIONS: None. ANESTHESIA/SEDATION: Fentanyl 2 mcg IV; Versed 75 mg IV Sedation time: 44 minutes; The patient was continuously monitored during the procedure by the interventional radiology nurse under my direct supervision. CONTRAST:  None COMPLICATIONS: SIR LEVEL C - Requires therapy, minor hospitalization (<48 hrs). Procedure complicated by development of a asymptomatic though slowly enlarging right-sided pneumothorax requiring placement of a CT-guided chest tube. PROCEDURE: Informed consent was obtained from the patient following an explanation of the procedure, risks, benefits and alternatives. The patient understands,agrees and consents for the procedure. All questions were addressed. A time out was performed prior to the initiation of the procedure. The patient was positioned supine on the CT table and a limited chest CT was performed for procedural planning demonstrating unchanged size and appearance of the known macrolobulated approximately 4.3 x 3.4 cm mass within the right upper lobe (image 17, series 2). The operative site was prepped and draped in the usual sterile fashion. Under sterile conditions and local anesthesia, a 17 gauge coaxial needle was advanced into the peripheral aspect of the nodule. Positioning was confirmed with intermittent CT fluoroscopy and followed by the acquisition of 4 core needle biopsy samples with an 18 gauge core needle biopsy device.  The coaxial needle was removed and superficial hemostasis was achieved with manual compression. Postprocedural imaging demonstrated development of a small postprocedural pneumothorax. While the patient remained asymptomatic, 15 minute delayed imaging demonstrated interval enlargement of the right-sided pneumothorax and the decision was made to place a CT-guided chest tube given pneumothorax enlargement. The skin overlying the right anterior upper chest was prepped and draped in usual sterile fashion. A 22 gauge needle was utilized for trajectory planning purposes after the overlying soft tissues were anesthetized with 1% lidocaine. An 18 gauge trocar needle was advanced into the right pleural space and Amplatz wire was coiled within the medial aspect of the right hemithorax. Appropriate positioning was confirmed. The track was dilated allowing placement of 10 French percutaneous drainage catheter. The drainage catheter was secured at the skin entrance site within interrupted suture and connected to a Pleur-Evac device. Postprocedural imaging was obtained demonstrating near complete evacuation of the right-sided pneumothorax. Dressings were placed. The patient otherwise tolerated the procedure well immediate postprocedural complication. IMPRESSION: 1. Technically successful CT guided core needle core biopsy of right upper lobe pulmonary mass biopsy. 2. Procedure complicated by development of a slowly enlarging though asymptomatic pneumothorax necessitating placement of a right-sided chest tube. PLAN: Patient will be admitted for continued observation and chest tube management. Electronically Signed   By: Sandi Mariscal M.D.   On: 10/03/2016 15:02    Labs:  CBC:  Recent  Labs  08/25/16 0445 08/31/16 0158 10/03/16 0925 10/04/16 0432  WBC 12.0* 10.6* 8.9 7.4  HGB 11.9* 12.3 13.3 12.4  HCT 37.1 37.9 42.5 39.6  PLT 227 349 275 228    COAGS:  Recent Labs  10/03/16 0925  INR 1.02  APTT 28     BMP:  Recent Labs  08/24/16 0153 08/25/16 1157 08/31/16 0158 10/04/16 0432  NA 141 138 137 138  K 4.1 3.9 3.2* 4.4  CL 106 104 101 103  CO2 '27 25 28 25  '$ GLUCOSE 113* 105* 113* 92  BUN '8 8 6 7  '$ CALCIUM 8.8* 8.9 8.8* 8.7*  CREATININE 0.63 0.57 0.52 0.65  GFRNONAA >60 >60 >60 >60  GFRAA >60 >60 >60 >60    LIVER FUNCTION TESTS: No results for input(s): BILITOT, AST, ALT, ALKPHOS, PROT, ALBUMIN in the last 8760 hours.  Assessment and Plan:  Rt chest tube placement post Rt lung mass bx and post procedure PTX Doing well Water sealed 6 am today Will check CXR 1000 am today   Electronically Signed: Madeline Bebout A 10/05/2016, 7:40 AM   I spent a total of 15 Minutes at the the patient's bedside AND on the patient's hospital floor or unit, greater than 50% of which was counseling/coordinating care for Rt chest tube

## 2016-10-09 ENCOUNTER — Encounter: Payer: Self-pay | Admitting: *Deleted

## 2016-10-09 ENCOUNTER — Telehealth: Payer: Self-pay | Admitting: *Deleted

## 2016-10-09 ENCOUNTER — Other Ambulatory Visit: Payer: Self-pay | Admitting: Radiation Oncology

## 2016-10-09 DIAGNOSIS — R918 Other nonspecific abnormal finding of lung field: Secondary | ICD-10-CM

## 2016-10-09 NOTE — Telephone Encounter (Signed)
Oncology Nurse Navigator Documentation  Oncology Nurse Navigator Flowsheets 10/09/2016  Navigator Location [facility]  Navigator Encounter Type Telephone/Dr. Sondra Come spoke with me regarding Ms. Hollabaugh to be seen by Dr. Julien Nordmann and to possibly send tissue for testing.  I spoke with Dr. Julien Nordmann and he stated he could see the patient but not urgently.  I called [name] to update him on discussion.  I was unable to reach him but I did leave a vm message for him to call me with my name and phone number  Telephone Outgoing Call  Treatment Phase Pre-Tx/Tx Discussion  Barriers/Navigation Needs Coordination of Care  Interventions Coordination of Care  Coordination of Care Appts  Acuity Level 2  Time Spent with Patient 30

## 2016-10-09 NOTE — Progress Notes (Signed)
Oncology Nurse Navigator Documentation  Oncology Nurse Navigator Flowsheets 10/09/2016  Navigator Location [facility] [facility]  Navigator Encounter Type Other/I followed up on Ms. Liuzzi's hospitalization.  She has been released and pathology is back. I updated Dr. Sondra Come for further instructions.   Treatment Phase Pre-Tx/Tx Discussion  Barriers/Navigation Needs Coordination of Care  Interventions Coordination of Care  Coordination of Care Appts  Acuity Level 2  Acuity Level 2 Assistance expediting appointments  Time Spent with Patient 30

## 2016-10-09 NOTE — Telephone Encounter (Signed)
Oncology Nurse Navigator Documentation  Oncology Nurse Navigator Flowsheets 10/09/2016  Navigator Location [facility]  Navigator Encounter Type Telephone/Dr. Sondra Come spoke to me about having Dr. Julien Nordmann see patient.  I updated Dr. Julien Nordmann and he would be glad to see Ms. Hillis.  I called her husband to talk about next steps with molecular testing and appt with Dr. Julien Nordmann. [name] thinks a PET scan is best.  I updated Dr. Sondra Come and he stated ok.  I received verbal order and place in EMR.  I notified Rad Onc authorization team to help expedite.  Once authorized, this will be scheduled.  .  Telephone Incoming Call  Treatment Phase Pre-Tx/Tx Discussion  Barriers/Navigation Needs Coordination of Care  Interventions Coordination of Care  Coordination of Care Appts;Other  Acuity Level 3  Time Spent with Patient 45

## 2016-10-10 DIAGNOSIS — H6123 Impacted cerumen, bilateral: Secondary | ICD-10-CM | POA: Diagnosis not present

## 2016-10-10 DIAGNOSIS — Z6821 Body mass index (BMI) 21.0-21.9, adult: Secondary | ICD-10-CM | POA: Diagnosis not present

## 2016-10-10 DIAGNOSIS — H9193 Unspecified hearing loss, bilateral: Secondary | ICD-10-CM | POA: Diagnosis not present

## 2016-10-10 DIAGNOSIS — R531 Weakness: Secondary | ICD-10-CM | POA: Diagnosis not present

## 2016-10-11 ENCOUNTER — Other Ambulatory Visit: Payer: Self-pay | Admitting: Medical Oncology

## 2016-10-11 ENCOUNTER — Telehealth: Payer: Self-pay | Admitting: *Deleted

## 2016-10-11 DIAGNOSIS — M6281 Muscle weakness (generalized): Secondary | ICD-10-CM | POA: Diagnosis not present

## 2016-10-11 DIAGNOSIS — I480 Paroxysmal atrial fibrillation: Secondary | ICD-10-CM | POA: Diagnosis not present

## 2016-10-11 DIAGNOSIS — Z86711 Personal history of pulmonary embolism: Secondary | ICD-10-CM | POA: Diagnosis not present

## 2016-10-11 DIAGNOSIS — E440 Moderate protein-calorie malnutrition: Secondary | ICD-10-CM | POA: Diagnosis not present

## 2016-10-11 DIAGNOSIS — R2689 Other abnormalities of gait and mobility: Secondary | ICD-10-CM | POA: Diagnosis not present

## 2016-10-11 DIAGNOSIS — I10 Essential (primary) hypertension: Secondary | ICD-10-CM | POA: Diagnosis not present

## 2016-10-11 DIAGNOSIS — I2510 Atherosclerotic heart disease of native coronary artery without angina pectoris: Secondary | ICD-10-CM | POA: Diagnosis not present

## 2016-10-11 DIAGNOSIS — Z9181 History of falling: Secondary | ICD-10-CM | POA: Diagnosis not present

## 2016-10-11 DIAGNOSIS — J45909 Unspecified asthma, uncomplicated: Secondary | ICD-10-CM | POA: Diagnosis not present

## 2016-10-11 DIAGNOSIS — M859 Disorder of bone density and structure, unspecified: Secondary | ICD-10-CM | POA: Diagnosis not present

## 2016-10-11 DIAGNOSIS — R918 Other nonspecific abnormal finding of lung field: Secondary | ICD-10-CM | POA: Diagnosis not present

## 2016-10-11 DIAGNOSIS — J439 Emphysema, unspecified: Secondary | ICD-10-CM | POA: Diagnosis not present

## 2016-10-11 DIAGNOSIS — J45998 Other asthma: Secondary | ICD-10-CM | POA: Diagnosis not present

## 2016-10-11 NOTE — Telephone Encounter (Signed)
Oncology Nurse Navigator Documentation  Oncology Nurse Navigator Flowsheets 10/11/2016  Navigator Location [facility]  Navigator Encounter Type Telephone/I received vm message from patient's husband. I called him back but was unable to reach.  I left vm message that I will call tomorrow.   Telephone Outgoing Call  Treatment Phase Pre-Tx/Tx Discussion  Barriers/Navigation Needs Coordination of Care  Interventions Coordination of Care  Coordination of Care Appts  Acuity Level 1  Time Spent with Patient 15

## 2016-10-11 NOTE — Telephone Encounter (Signed)
Oncology Nurse Navigator Documentation  Oncology Nurse Navigator Flowsheets 10/11/2016  Navigator Location [facility]  Navigator Encounter Type Telephone/per Dr. Clabe Seal request and ok with Dr. Julien Nordmann, I called patient to schedule her to be seen with Dr. Julien Nordmann. I left vm message to call me with my name and phone number.   Telephone Outgoing Call  Treatment Phase Pre-Tx/Tx Discussion  Barriers/Navigation Needs Coordination of Care  Interventions Coordination of Care  Coordination of Care Appts  Acuity Level 1  Time Spent with Patient 15

## 2016-10-12 ENCOUNTER — Telehealth: Payer: Self-pay | Admitting: *Deleted

## 2016-10-12 DIAGNOSIS — R918 Other nonspecific abnormal finding of lung field: Secondary | ICD-10-CM

## 2016-10-12 NOTE — Telephone Encounter (Signed)
Oncology Nurse Navigator Documentation  Oncology Nurse Navigator Flowsheets 10/12/2016  Navigator Location [facility]  Navigator Encounter Type Telephone/I called [name] to update him on cancer conference discussion and to schedule his wife with Dr. Julien Nordmann.  He did express concern that he felt getting starting on treatment was taking a long time.  I listened as he explained.  Per conference this am Dr. Sondra Come stated he would call [name] and update him on next steps.  I did give him the appt to see Dr. Julien Nordmann and he verbalized understanding of appt time and place.   Telephone Outgoing Call  Treatment Phase Pre-Tx/Tx Discussion  Barriers/Navigation Needs Coordination of Care  Interventions Coordination of Care  Coordination of Care Appts  Acuity Level 1  Time Spent with Patient 30

## 2016-10-13 DIAGNOSIS — J45909 Unspecified asthma, uncomplicated: Secondary | ICD-10-CM | POA: Diagnosis not present

## 2016-10-13 DIAGNOSIS — R2689 Other abnormalities of gait and mobility: Secondary | ICD-10-CM | POA: Diagnosis not present

## 2016-10-13 DIAGNOSIS — M6281 Muscle weakness (generalized): Secondary | ICD-10-CM | POA: Diagnosis not present

## 2016-10-13 DIAGNOSIS — I10 Essential (primary) hypertension: Secondary | ICD-10-CM | POA: Diagnosis not present

## 2016-10-13 DIAGNOSIS — I480 Paroxysmal atrial fibrillation: Secondary | ICD-10-CM | POA: Diagnosis not present

## 2016-10-13 DIAGNOSIS — J439 Emphysema, unspecified: Secondary | ICD-10-CM | POA: Diagnosis not present

## 2016-10-16 ENCOUNTER — Encounter (HOSPITAL_COMMUNITY): Payer: Self-pay

## 2016-10-16 ENCOUNTER — Ambulatory Visit
Admission: RE | Admit: 2016-10-16 | Discharge: 2016-10-16 | Disposition: A | Discharge status: Home / Self Care | Payer: Medicare Other | Source: Ambulatory Visit | Attending: Radiation Oncology | Admitting: Radiation Oncology

## 2016-10-16 DIAGNOSIS — R918 Other nonspecific abnormal finding of lung field: Secondary | ICD-10-CM

## 2016-10-16 DIAGNOSIS — C3490 Malignant neoplasm of unspecified part of unspecified bronchus or lung: Secondary | ICD-10-CM | POA: Diagnosis not present

## 2016-10-16 MED ORDER — GADOBENATE DIMEGLUMINE 529 MG/ML IV SOLN
10.0000 mL | Freq: Once | INTRAVENOUS | Status: AC | PRN
  Administered 2016-10-16: 10 mL via INTRAVENOUS

## 2016-10-17 DIAGNOSIS — J45909 Unspecified asthma, uncomplicated: Secondary | ICD-10-CM | POA: Diagnosis not present

## 2016-10-17 DIAGNOSIS — J439 Emphysema, unspecified: Secondary | ICD-10-CM | POA: Diagnosis not present

## 2016-10-17 DIAGNOSIS — M6281 Muscle weakness (generalized): Secondary | ICD-10-CM | POA: Diagnosis not present

## 2016-10-17 DIAGNOSIS — R2689 Other abnormalities of gait and mobility: Secondary | ICD-10-CM | POA: Diagnosis not present

## 2016-10-17 DIAGNOSIS — I10 Essential (primary) hypertension: Secondary | ICD-10-CM | POA: Diagnosis not present

## 2016-10-17 DIAGNOSIS — I480 Paroxysmal atrial fibrillation: Secondary | ICD-10-CM | POA: Diagnosis not present

## 2016-10-18 ENCOUNTER — Ambulatory Visit
Admission: RE | Admit: 2016-10-18 | Discharge: 2016-10-18 | Disposition: A | Discharge status: Home / Self Care | Payer: Medicare Other | Source: Ambulatory Visit | Attending: Radiation Oncology | Admitting: Radiation Oncology

## 2016-10-18 DIAGNOSIS — Z510 Encounter for antineoplastic radiation therapy: Secondary | ICD-10-CM | POA: Insufficient documentation

## 2016-10-18 DIAGNOSIS — C3411 Malignant neoplasm of upper lobe, right bronchus or lung: Secondary | ICD-10-CM | POA: Diagnosis not present

## 2016-10-18 DIAGNOSIS — R918 Other nonspecific abnormal finding of lung field: Secondary | ICD-10-CM

## 2016-10-18 DIAGNOSIS — C3412 Malignant neoplasm of upper lobe, left bronchus or lung: Secondary | ICD-10-CM | POA: Diagnosis not present

## 2016-10-18 NOTE — Progress Notes (Signed)
  Radiation Oncology         [phone] ________________________________  Name: Jodi Burns MRN: [id number]  Date: 10/18/2016  DOB: August 04, 1926   STEREOTACTIC BODY RADIOTHERAPY SIMULATION AND TREATMENT PLANNING NOTE    DIAGNOSIS:  NSCLC, adenocarcinoma, of the right upper lung and PET positive left upper lung mass  NARRATIVE:  The patient was brought to the [facility].  Identity was confirmed.  All relevant records and images related to the planned course of therapy were reviewed.  The patient freely provided informed written consent to proceed with treatment after reviewing the details related to the planned course of therapy. The consent form was witnessed and verified by the simulation staff.  Then, the patient was set-up in a stable reproducible  supine position for radiation therapy.  A BodyFix immobilization pillow was fabricated for reproducible positioning.  Then I personally applied the abdominal compression paddle to limit respiratory excursion.  4D respiratoy motion management CT images were obtained.  Surface markings were placed.  The CT images were loaded into the planning software.  Then, using Cine, MIP, and standard views, the internal target volume (ITV) and planning target volumes (PTV) were delinieated, and avoidance structures were contoured.  Treatment planning then occurred.  The radiation prescription was entered and confirmed.  A total of two complex treatment devices were fabricated in the form of the BodyFix immobilization pillow and a neck accuform cushion.  I have requested : 3D Simulation  I have requested a DVH of the following structures: Heart, Lungs, Esophagus, Chest Wall, Brachial Plexus, Major Blood Vessels, and targets.  PLAN:  The patient will receive 50-60 Gy in 3-10 fractions directed at the right upper lung mass.  The PET positive lesion in the left upper lung will receive 54 gray in 3  fractions.  -----------------------------------  Blair Promise, PhD, MD  This document serves as a record of services personally performed by Gery Pray, MD. It was created on his behalf by Darcus Austin, a trained medical scribe. The creation of this record is based on the scribe's personal observations and the provider's statements to them. This document has been checked and approved by the attending provider.

## 2016-10-19 DIAGNOSIS — I480 Paroxysmal atrial fibrillation: Secondary | ICD-10-CM | POA: Diagnosis not present

## 2016-10-19 DIAGNOSIS — J439 Emphysema, unspecified: Secondary | ICD-10-CM | POA: Diagnosis not present

## 2016-10-19 DIAGNOSIS — J45909 Unspecified asthma, uncomplicated: Secondary | ICD-10-CM | POA: Diagnosis not present

## 2016-10-19 DIAGNOSIS — M6281 Muscle weakness (generalized): Secondary | ICD-10-CM | POA: Diagnosis not present

## 2016-10-19 DIAGNOSIS — R2689 Other abnormalities of gait and mobility: Secondary | ICD-10-CM | POA: Diagnosis not present

## 2016-10-19 DIAGNOSIS — I10 Essential (primary) hypertension: Secondary | ICD-10-CM | POA: Diagnosis not present

## 2016-10-20 DIAGNOSIS — M6281 Muscle weakness (generalized): Secondary | ICD-10-CM | POA: Diagnosis not present

## 2016-10-20 DIAGNOSIS — I480 Paroxysmal atrial fibrillation: Secondary | ICD-10-CM | POA: Diagnosis not present

## 2016-10-20 DIAGNOSIS — J439 Emphysema, unspecified: Secondary | ICD-10-CM | POA: Diagnosis not present

## 2016-10-20 DIAGNOSIS — I10 Essential (primary) hypertension: Secondary | ICD-10-CM | POA: Diagnosis not present

## 2016-10-20 DIAGNOSIS — J45909 Unspecified asthma, uncomplicated: Secondary | ICD-10-CM | POA: Diagnosis not present

## 2016-10-20 DIAGNOSIS — R2689 Other abnormalities of gait and mobility: Secondary | ICD-10-CM | POA: Diagnosis not present

## 2016-10-23 DIAGNOSIS — C3411 Malignant neoplasm of upper lobe, right bronchus or lung: Secondary | ICD-10-CM | POA: Insufficient documentation

## 2016-10-23 DIAGNOSIS — R918 Other nonspecific abnormal finding of lung field: Secondary | ICD-10-CM | POA: Insufficient documentation

## 2016-10-24 DIAGNOSIS — I480 Paroxysmal atrial fibrillation: Secondary | ICD-10-CM | POA: Diagnosis not present

## 2016-10-24 DIAGNOSIS — I10 Essential (primary) hypertension: Secondary | ICD-10-CM | POA: Diagnosis not present

## 2016-10-24 DIAGNOSIS — J45909 Unspecified asthma, uncomplicated: Secondary | ICD-10-CM | POA: Diagnosis not present

## 2016-10-24 DIAGNOSIS — M6281 Muscle weakness (generalized): Secondary | ICD-10-CM | POA: Diagnosis not present

## 2016-10-24 DIAGNOSIS — J439 Emphysema, unspecified: Secondary | ICD-10-CM | POA: Diagnosis not present

## 2016-10-24 DIAGNOSIS — R2689 Other abnormalities of gait and mobility: Secondary | ICD-10-CM | POA: Diagnosis not present

## 2016-10-25 DIAGNOSIS — R2689 Other abnormalities of gait and mobility: Secondary | ICD-10-CM | POA: Diagnosis not present

## 2016-10-25 DIAGNOSIS — I10 Essential (primary) hypertension: Secondary | ICD-10-CM | POA: Diagnosis not present

## 2016-10-25 DIAGNOSIS — J439 Emphysema, unspecified: Secondary | ICD-10-CM | POA: Diagnosis not present

## 2016-10-25 DIAGNOSIS — M6281 Muscle weakness (generalized): Secondary | ICD-10-CM | POA: Diagnosis not present

## 2016-10-25 DIAGNOSIS — I480 Paroxysmal atrial fibrillation: Secondary | ICD-10-CM | POA: Diagnosis not present

## 2016-10-25 DIAGNOSIS — J45909 Unspecified asthma, uncomplicated: Secondary | ICD-10-CM | POA: Diagnosis not present

## 2016-10-26 ENCOUNTER — Encounter (HOSPITAL_COMMUNITY): Payer: Self-pay

## 2016-10-27 DIAGNOSIS — R2689 Other abnormalities of gait and mobility: Secondary | ICD-10-CM | POA: Diagnosis not present

## 2016-10-27 DIAGNOSIS — I480 Paroxysmal atrial fibrillation: Secondary | ICD-10-CM | POA: Diagnosis not present

## 2016-10-27 DIAGNOSIS — J45909 Unspecified asthma, uncomplicated: Secondary | ICD-10-CM | POA: Diagnosis not present

## 2016-10-27 DIAGNOSIS — I10 Essential (primary) hypertension: Secondary | ICD-10-CM | POA: Diagnosis not present

## 2016-10-27 DIAGNOSIS — M6281 Muscle weakness (generalized): Secondary | ICD-10-CM | POA: Diagnosis not present

## 2016-10-27 DIAGNOSIS — J439 Emphysema, unspecified: Secondary | ICD-10-CM | POA: Diagnosis not present

## 2016-10-30 DIAGNOSIS — C3411 Malignant neoplasm of upper lobe, right bronchus or lung: Secondary | ICD-10-CM | POA: Diagnosis not present

## 2016-10-30 DIAGNOSIS — Z510 Encounter for antineoplastic radiation therapy: Secondary | ICD-10-CM | POA: Diagnosis not present

## 2016-10-30 DIAGNOSIS — C3412 Malignant neoplasm of upper lobe, left bronchus or lung: Secondary | ICD-10-CM | POA: Diagnosis not present

## 2016-10-31 ENCOUNTER — Encounter: Payer: Self-pay | Admitting: Radiation Oncology

## 2016-10-31 ENCOUNTER — Ambulatory Visit
Admission: RE | Admit: 2016-10-31 | Discharge: 2016-10-31 | Disposition: A | Discharge status: Home / Self Care | Payer: Medicare Other | Source: Ambulatory Visit | Attending: Radiation Oncology | Admitting: Radiation Oncology

## 2016-10-31 ENCOUNTER — Ambulatory Visit
Admission: RE | Admit: 2016-10-31 | Discharge: 2016-10-31 | Disposition: A | Discharge status: Home / Self Care | Payer: Self-pay | Source: Ambulatory Visit | Attending: Radiation Oncology | Admitting: Radiation Oncology

## 2016-10-31 VITALS — BP 142/69 | HR 77 | Temp 97.8°F | Resp 16 | Ht 62.0 in | Wt 124.4 lb

## 2016-10-31 DIAGNOSIS — C3412 Malignant neoplasm of upper lobe, left bronchus or lung: Secondary | ICD-10-CM | POA: Diagnosis not present

## 2016-10-31 DIAGNOSIS — C3411 Malignant neoplasm of upper lobe, right bronchus or lung: Secondary | ICD-10-CM | POA: Diagnosis not present

## 2016-10-31 DIAGNOSIS — Z510 Encounter for antineoplastic radiation therapy: Secondary | ICD-10-CM | POA: Diagnosis not present

## 2016-10-31 MED ORDER — RADIAPLEXRX EX GEL
Freq: Once | CUTANEOUS | Status: AC
  Administered 2016-10-31: 17:00:00 via TOPICAL

## 2016-10-31 NOTE — Progress Notes (Signed)
Mrs. Jodi Burns's weight and vital signs were stable. Pt here for patient teaching.  Pt given Radiation and You booklet, skin care instructions and Radiplex gel. Pt reports they have not watched the Radiation Therapy Education video gave the link on October 31, 2016.  Reviewed areas of pertinence such as fatigue, hair loss, skin changes, throat changes, cough and shortness of breath . Pt able to give teach back of to pat skin, use unscented/gentle soap and drink plenty of water,apply Radiaplex gel bid and avoid applying anything to skin within 4 hours of treatment. Pt demonstrated understanding of information given and will contact nursing with any questions or concerns.   No areas of concern today. .weight: Wt Readings from Last 3 Encounters:  10/31/16 124 lb 6.4 oz (56.4 kg)  10/31/16 124 lb 6.4 oz (56.4 kg)  09/28/16 119 lb 6.4 oz (54.2 kg)  BP (!) 142/69   Pulse 77   Temp 97.8 F (36.6 C) (Oral)   Resp 16   Ht '5\' 2"'$  (1.575 m)   Wt 124 lb 6.4 oz (56.4 kg)   LMP 08/29/1967   SpO2 100%   BMI 22.75 kg/m    [email or web address]

## 2016-10-31 NOTE — Progress Notes (Signed)
  Radiation Oncology         [phone] ________________________________  Name: Jodi Burns MRN: [id number]  Date: 10/31/2016  DOB: 1926-03-28  Weekly Radiation Therapy Management    ICD-9-CM ICD-10-CM   1. Primary cancer of right upper lobe of lung (HCC) 162.3 C34.11 hyaluronate sodium (RADIAPLEXRX) gel     Current Dose: 18 Gy LUL and 5 Gy RUL    Planned Dose:  54 Gy LUL and 50 Gy RUL  Narrative . . . . . . . . The patient presents for routine under treatment assessment.                                   The patient is without complaint at this time. The patient and her husband had questions regarding the process of radiation and possible side effects.                                  Set-up films were reviewed.                                 The chart was checked. Physical Findings. . .  height is '5\' 2"'$  (1.575 m) and weight is 124 lb 6.4 oz (56.4 kg). Her oral temperature is 97.8 F (36.6 C). Her blood pressure is 142/69 (abnormal) and her pulse is 77. Her respiration is 16 and oxygen saturation is 100%. . Weight essentially stable. Presents in a wheelchair. Lungs are clear to auscultation bilaterally. Heart has regular rate and rhythm. Impression . . . . . . . The patient is tolerating radiation. Plan . . . . . . . . . . . . Continue treatment as planned.  I do not expect much or if any skin reaction. I advised the patient that if the treatment areas become red, she is to apply radiaplex. ________________________________   Blair Promise, PhD, MD  This document serves as a record of services personally performed by Gery Pray, MD. It was created on his behalf by Darcus Austin, a trained medical scribe. The creation of this record is based on the scribe's personal observations and the provider's statements to them. This document has been checked and approved by the attending provider.

## 2016-10-31 NOTE — Progress Notes (Signed)
Mrs. Jodi Burns's weight and vital signs were stable. Pt here for patient teaching.  Pt given Radiation and You booklet, skin care instructions and Radiplex gel. Pt reports they have not watched the Radiation Therapy Education video gave the link on October 31, 2016.  Reviewed areas of pertinence such as fatigue, hair loss, skin changes, throat changes, cough and shortness of breath . Pt able to give teach back of to pat skin, use unscented/gentle soap and drink plenty of water,apply Radiaplex gel bid and avoid applying anything to skin within 4 hours of treatment. Pt demonstrated understanding of information given and will contact nursing with any questions or concerns.   No areas of concern today. .weight: Wt Readings from Last 3 Encounters:  10/31/16 124 lb 6.4 oz (56.4 kg)  09/28/16 119 lb 6.4 oz (54.2 kg)  09/28/16 122 lb (55.3 kg)  BP (!) 142/69   Pulse 77   Temp 97.8 F (36.6 C) (Oral)   Resp 16   Ht '5\' 2"'$  (1.575 m)   Wt 124 lb 6.4 oz (56.4 kg)   LMP 08/29/1967   SpO2 100%   BMI 22.75 kg/m    [email or web address]

## 2016-10-31 NOTE — Progress Notes (Signed)
  Radiation Oncology         (336) [phone] ________________________________  Name: Jodi Burns MRN: [id number]  Date: 10/31/2016  DOB: 1926/03/01  Stereotactic Body Radiotherapy Treatment Procedure Note  NARRATIVE:  Jodi Burns was brought to the stereotactic radiation treatment machine and placed supine on the CT couch. The patient was set up for stereotactic body radiotherapy on the body fix pillow.  3D TREATMENT PLANNING AND DOSIMETRY:  The patient's radiation plan was reviewed and approved prior to starting treatment.  It showed 3-dimensional radiation distributions overlaid onto the planning CT.  The [facility] for the target structures as well as the organs at risk were reviewed. The documentation of this is filed in the radiation oncology EMR.  SIMULATION VERIFICATION:  The patient underwent CT imaging on the treatment unit.  These were carefully aligned to document that the ablative radiation dose would cover the target volume and maximally spare the nearby organs at risk according to the planned distribution.  SPECIAL TREATMENT PROCEDURE: Jodi Burns received high dose ablative stereotactic body radiotherapy to the planned target volume without unforeseen complications. Treatment was delivered uneventfully. The high doses associated with stereotactic body radiotherapy and the significant potential risks require careful treatment set up and patient monitoring constituting a special treatment procedure   STEREOTACTIC TREATMENT MANAGEMENT:  Following delivery, the patient was evaluated clinically. The patient tolerated treatment without significant acute effects, and was discharged to home in stable condition.    PLAN: Continue treatment as planned.  ________________________________  Blair Promise, PhD, MD

## 2016-11-01 ENCOUNTER — Ambulatory Visit
Admission: RE | Admit: 2016-11-01 | Discharge: 2016-11-01 | Disposition: A | Discharge status: Home / Self Care | Payer: Medicare Other | Source: Ambulatory Visit | Attending: Radiation Oncology | Admitting: Radiation Oncology

## 2016-11-01 DIAGNOSIS — C3412 Malignant neoplasm of upper lobe, left bronchus or lung: Secondary | ICD-10-CM | POA: Diagnosis not present

## 2016-11-01 DIAGNOSIS — C3411 Malignant neoplasm of upper lobe, right bronchus or lung: Secondary | ICD-10-CM | POA: Diagnosis not present

## 2016-11-01 DIAGNOSIS — Z510 Encounter for antineoplastic radiation therapy: Secondary | ICD-10-CM | POA: Diagnosis not present

## 2016-11-02 ENCOUNTER — Ambulatory Visit
Admission: RE | Admit: 2016-11-02 | Discharge: 2016-11-02 | Disposition: A | Discharge status: Home / Self Care | Payer: Medicare Other | Source: Ambulatory Visit | Attending: Radiation Oncology | Admitting: Radiation Oncology

## 2016-11-02 DIAGNOSIS — C3411 Malignant neoplasm of upper lobe, right bronchus or lung: Secondary | ICD-10-CM | POA: Diagnosis not present

## 2016-11-02 DIAGNOSIS — C3412 Malignant neoplasm of upper lobe, left bronchus or lung: Secondary | ICD-10-CM | POA: Diagnosis not present

## 2016-11-02 DIAGNOSIS — Z510 Encounter for antineoplastic radiation therapy: Secondary | ICD-10-CM | POA: Diagnosis not present

## 2016-11-02 NOTE — Progress Notes (Signed)
  Radiation Oncology         (336) [phone] ________________________________  Name: Jodi Burns MRN: [id number]  Date: 11/02/2016  DOB: 1926-01-07  Stereotactic Body Radiotherapy Treatment Procedure Note  NARRATIVE:  Jodi Burns was brought to the stereotactic radiation treatment machine and placed supine on the CT couch. The patient was set up for stereotactic body radiotherapy on the body fix pillow.  3D TREATMENT PLANNING AND DOSIMETRY:  The patient's radiation plan was reviewed and approved prior to starting treatment.  It showed 3-dimensional radiation distributions overlaid onto the planning CT.  The [facility] for the target structures as well as the organs at risk were reviewed. The documentation of this is filed in the radiation oncology EMR.  SIMULATION VERIFICATION:  The patient underwent CT imaging on the treatment unit.  These were carefully aligned to document that the ablative radiation dose would cover the target volume and maximally spare the nearby organs at risk according to the planned distribution.  SPECIAL TREATMENT PROCEDURE: Jodi Burns received high dose ablative stereotactic body radiotherapy to the planned target volume without unforeseen complications. Treatment was delivered uneventfully. The high doses associated with stereotactic body radiotherapy and the significant potential risks require careful treatment set up and patient monitoring constituting a special treatment procedure   STEREOTACTIC TREATMENT MANAGEMENT:  Following delivery, the patient was evaluated clinically. The patient tolerated treatment without significant acute effects, and was discharged to home in stable condition.    PLAN: Continue treatment as planned.  ________________________________  Blair Promise, PhD, MD  This document serves as a record of services personally performed by Gery Pray, MD. It was created on his behalf by Bethann Humble, a trained medical scribe. The  creation of this record is based on the scribe's personal observations and the provider's statements to them. This document has been checked and approved by the attending provider.

## 2016-11-03 ENCOUNTER — Ambulatory Visit
Admission: RE | Admit: 2016-11-03 | Discharge: 2016-11-03 | Disposition: A | Discharge status: Home / Self Care | Payer: Medicare Other | Source: Ambulatory Visit | Attending: Radiation Oncology | Admitting: Radiation Oncology

## 2016-11-03 DIAGNOSIS — Z510 Encounter for antineoplastic radiation therapy: Secondary | ICD-10-CM | POA: Diagnosis not present

## 2016-11-03 DIAGNOSIS — C3412 Malignant neoplasm of upper lobe, left bronchus or lung: Secondary | ICD-10-CM | POA: Diagnosis not present

## 2016-11-03 DIAGNOSIS — C3411 Malignant neoplasm of upper lobe, right bronchus or lung: Secondary | ICD-10-CM | POA: Diagnosis not present

## 2016-11-06 ENCOUNTER — Ambulatory Visit
Admission: RE | Admit: 2016-11-06 | Discharge: 2016-11-06 | Disposition: A | Discharge status: Home / Self Care | Payer: Medicare Other | Source: Ambulatory Visit | Attending: Radiation Oncology | Admitting: Radiation Oncology

## 2016-11-06 DIAGNOSIS — Z510 Encounter for antineoplastic radiation therapy: Secondary | ICD-10-CM | POA: Diagnosis not present

## 2016-11-06 DIAGNOSIS — C3411 Malignant neoplasm of upper lobe, right bronchus or lung: Secondary | ICD-10-CM | POA: Diagnosis not present

## 2016-11-06 DIAGNOSIS — I480 Paroxysmal atrial fibrillation: Secondary | ICD-10-CM | POA: Diagnosis not present

## 2016-11-06 DIAGNOSIS — J45909 Unspecified asthma, uncomplicated: Secondary | ICD-10-CM | POA: Diagnosis not present

## 2016-11-06 DIAGNOSIS — R2689 Other abnormalities of gait and mobility: Secondary | ICD-10-CM | POA: Diagnosis not present

## 2016-11-06 DIAGNOSIS — C3412 Malignant neoplasm of upper lobe, left bronchus or lung: Secondary | ICD-10-CM | POA: Diagnosis not present

## 2016-11-06 DIAGNOSIS — I10 Essential (primary) hypertension: Secondary | ICD-10-CM | POA: Diagnosis not present

## 2016-11-06 DIAGNOSIS — J439 Emphysema, unspecified: Secondary | ICD-10-CM | POA: Diagnosis not present

## 2016-11-06 DIAGNOSIS — M6281 Muscle weakness (generalized): Secondary | ICD-10-CM | POA: Diagnosis not present

## 2016-11-06 NOTE — Progress Notes (Signed)
  Radiation Oncology         (336) [phone] ________________________________  Name: Jodi Burns MRN: [id number]  Date: 11/06/2016  DOB: 03/26/1926  Stereotactic Body Radiotherapy Treatment Procedure Note  NARRATIVE:  Jodi Burns was brought to the stereotactic radiation treatment machine and placed supine on the CT couch. The patient was set up for stereotactic body radiotherapy on the body fix pillow.  3D TREATMENT PLANNING AND DOSIMETRY:  The patient's radiation plan was reviewed and approved prior to starting treatment.  It showed 3-dimensional radiation distributions overlaid onto the planning CT.  The [facility] for the target structures as well as the organs at risk were reviewed. The documentation of this is filed in the radiation oncology EMR.  SIMULATION VERIFICATION:  The patient underwent CT imaging on the treatment unit.  These were carefully aligned to document that the ablative radiation dose would cover the target volume and maximally spare the nearby organs at risk according to the planned distribution.  SPECIAL TREATMENT PROCEDURE: Jodi Burns received high dose ablative stereotactic body radiotherapy to the planned target volume without unforeseen complications. Treatment was delivered uneventfully. The high doses associated with stereotactic body radiotherapy and the significant potential risks require careful treatment set up and patient monitoring constituting a special treatment procedure   STEREOTACTIC TREATMENT MANAGEMENT:  Following delivery, the patient was evaluated clinically. The patient tolerated treatment without significant acute effects, and was discharged to home in stable condition.    PLAN: The patient will continue to receive 50-60 Gy in 3-10 fractions directed at the right upper lung mass. The patient has completed 54 Gy in 3 fractions to the PET positive lesion in the left upper lung. ________________________________  Blair Promise, PhD,  MD  This document serves as a record of services personally performed by Gery Pray, MD. It was created on his behalf by Darcus Austin, a trained medical scribe. The creation of this record is based on the scribe's personal observations and the provider's statements to them. This document has been checked and approved by the attending provider.

## 2016-11-07 ENCOUNTER — Ambulatory Visit (HOSPITAL_BASED_OUTPATIENT_CLINIC_OR_DEPARTMENT_OTHER): Payer: Medicare Other | Admitting: Internal Medicine

## 2016-11-07 ENCOUNTER — Encounter: Payer: Self-pay | Admitting: Radiation Oncology

## 2016-11-07 ENCOUNTER — Ambulatory Visit
Admission: RE | Admit: 2016-11-07 | Discharge: 2016-11-07 | Disposition: A | Discharge status: Home / Self Care | Payer: Medicare Other | Source: Ambulatory Visit | Attending: Radiation Oncology | Admitting: Radiation Oncology

## 2016-11-07 ENCOUNTER — Other Ambulatory Visit: Payer: Medicare Other

## 2016-11-07 ENCOUNTER — Encounter: Payer: Self-pay | Admitting: Internal Medicine

## 2016-11-07 VITALS — BP 120/62 | HR 85 | Temp 98.2°F | Resp 17 | Ht 62.0 in | Wt 123.0 lb

## 2016-11-07 VITALS — BP 127/54 | HR 78 | Temp 97.5°F | Ht 62.0 in | Wt 123.0 lb

## 2016-11-07 DIAGNOSIS — Z7189 Other specified counseling: Secondary | ICD-10-CM | POA: Insufficient documentation

## 2016-11-07 DIAGNOSIS — C3412 Malignant neoplasm of upper lobe, left bronchus or lung: Secondary | ICD-10-CM | POA: Diagnosis not present

## 2016-11-07 DIAGNOSIS — C3411 Malignant neoplasm of upper lobe, right bronchus or lung: Secondary | ICD-10-CM | POA: Diagnosis not present

## 2016-11-07 DIAGNOSIS — R918 Other nonspecific abnormal finding of lung field: Secondary | ICD-10-CM

## 2016-11-07 DIAGNOSIS — Z510 Encounter for antineoplastic radiation therapy: Secondary | ICD-10-CM | POA: Diagnosis not present

## 2016-11-07 NOTE — Progress Notes (Signed)
Jodi Burns:(336) [phone]   Fax:(336) [phone]  CONSULT NOTE  REFERRING PHYSICIAN: Dr. Gery Pray  REASON FOR CONSULTATION:  83 years old white female recently diagnosed with lung cancer. HPI Jodi Burns is a 83 y.o. female with past medical history significant for atrial fibrillation, allergic rhinitis, osteopenia, vitamin D deficiency, history of pulmonary embolism as well as history of histoplasmosis with involvement of the eye. The patient mentions that in the middle of December 2017 she was complaining of 2 weeks duration of shortness of breath and upper respiratory infection. She was getting weaker and not feeling well. She presented to the emergency department complaining of shortness of breath. During her evaluation she had chest x-ray performed on 08/20/2016 and it showed right upper lobe mass measuring 2.2 x 4.2 x 4.4 cm concerning for malignancy. This was followed by CT angiogram of the chest on the same day and it showed right upper lobe lung mass measuring 3.4 x 4.0 x 3.9 cm. There was also 0.4 cm right lower lobe pulmonary nodule and a spiculated left upper lobe density measuring 1.2 x 0.8 cm as well as vague 0.5 cm left lower lobe pulmonary nodule. A PET scan was performed on 09/15/2016 and it showed hypermetabolic activity in the right upper lobe mass measuring 4.0 cm suspicious for primary bronchogenic carcinoma. The small nodule in the left upper lobe exhibit low-level malignant range FDG uptake and is worrisome for primary lung neoplasm. There was no evidence for mediastinal or hilar nodal metastasis or distant metastatic disease. On 10/03/2016 the patient underwent CT-guided core biopsy of the right upper lobe pulmonary mass by interventional radiology and this procedure was complicated with right pneumothorax requiring right-sided chest tube placement. The final pathology ([id number]) showed adenocarcinoma of lung primary. The  immunohistochemical stains were positive for TTF-1 and Napsin-A but negative for cytokeratin 5-6 and p63. Molecular studies were negative for EGFR mutation, ALK, ROS 1 and BRAF mutations. PDL1 expression was 10%. The patient was seen by Dr. Sondra Come and she started palliative radiotherapy to the bilateral pulmonary nodules. MRI of the brain was negative for metastatic disease to the brain. Dr. Sondra Come kindly referred the patient to me today for evaluation and recommendation regarding treatment of her condition. When seen today the patient complains of fatigue as well as shortness breath with exertion but no significant chest pain. She has mild cough with no hemoptysis. She denied having any fever or chills. She denied having any significant weight loss or night sweats. She has visual changes secondary to history of histoplasmosis. Family history significant for mother with osteopenia and died from old age. Father died from sepsis. The patient is married and has 2 children. She was accompanied today by her husband [name], retired pediatrician. She used to work as a Investment banker, operational She has remote history of smoking and quit in 1960. She drinks 1-2 glasses of wine every night. She has no history of drug abuse.  HPI  Past Medical History:  Diagnosis Date  . Allergic rhinitis   . Arthritis    "the normal for my age" (10/03/2016)  . Asthma    "very mild" (10/03/2016)  . Atrial fibrillation ([place])   . Chronic lower back pain   . Dyspnea on exertion   . History of blood transfusion    "I was young; don't remember why I needed it"  . 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  On home oxygen therapy    "available; not needed it yet" (10/03/2016)  . Osteopenia   . Other and unspecified hyperlipidemia   . Other retinal disorders(362.89)   . Personal history of other diseases of circulatory system   . Personal history of venous thrombosis and  embolism 4/94  . Reactive airway disease   . Subjective visual disturbance, unspecified   . Vitamin D deficiency     Past Surgical History:  Procedure Laterality Date  . APPENDECTOMY    . BREAST BIOPSY Bilateral   . CARDIOVASCULAR STRESS TEST  August 2013   CPET-MET: Submaximal effort, did not reach anaerobic threshold --> peak VO2 76% (not overly concerning for CAD)  . CARDIOVERSION N/A 08/29/2016   Procedure: CARDIOVERSION;  Surgeon: Sanda Klein, MD;  Location: [facility];  Service: Cardiovascular;  Laterality: N/A;  . CARPAL TUNNEL RELEASE Bilateral 2006  . GLAUCOMA SURGERY Bilateral   . PERINEOPLASTY     and rectocele  . Pulmonary Function Test  August 2013   CPET-PFTs: FVC 58%, FEV1 48%; breathing reserve less than 10%, normal DLCO; reduced vital capacity -- suggesting obstructive lung disease  . SHOULDER ARTHROSCOPY W/ ROTATOR CUFF REPAIR  7/04, 2003; ?date   right, "left side was done a 2nd time"  . TEE WITHOUT CARDIOVERSION N/A 08/29/2016   Procedure: TRANSESOPHAGEAL ECHOCARDIOGRAM (TEE);  Surgeon: Sanda Klein, MD;  Location: [facility] ENDOSCOPY;  Service: Cardiovascular;  Laterality: N/A;  . TONSILLECTOMY    . TOTAL ABDOMINAL HYSTERECTOMY    . TRANSTHORACIC ECHOCARDIOGRAM  February 2011   EF 55-60%. Grade 1 diastolic dysfunction/relaxation abnormality. Mild MR    Family History  Problem Relation Age of Onset  . Osteoporosis Mother   . Diabetes Paternal Aunt     Social History Social History  Substance Use Topics  . Smoking status: Former Smoker    Packs/day: 0.75    Years: 25.00    Types: Cigarettes    Quit date: 1969  . Smokeless tobacco: Never Used     Comment: 3/4 ppd 1946-1969 (17.5 pack years)  . Alcohol use 4.2 oz/week    3 Glasses of wine, 4 Shots of liquor per week     Comment: 10/03/2016 "high ball or glass of wine prior to dinner"    Allergies  Allergen Reactions  . Cefaclor     REACTION: gi upset  . Codeine Nausea And Vomiting    Current  Outpatient Prescriptions  Medication Sig Dispense Refill  . acetaminophen (TYLENOL) 325 MG tablet Take 650 mg by mouth every 6 (six) hours as needed for mild pain.     Marland Kitchen amiodarone (PACERONE) 200 MG tablet Take 1 tablet (200 mg total) by mouth daily. 30 tablet 0  . apixaban (ELIQUIS) 2.5 MG TABS tablet Take 1 tablet (2.5 mg total) by mouth 2 (two) times daily. 60 tablet 0  . budesonide-formoterol (SYMBICORT) 160-4.5 MCG/ACT inhaler Inhale 2 puffs into the lungs 2 (two) times daily. Rinse mouth (Patient taking differently: Inhale 1 puff into the lungs 2 (two) times daily. Rinse mouth) 10.2 g 11  . Cholecalciferol (VITAMIN D3) 5000 units TABS Take 2 tablets by mouth daily.    . Coenzyme Q10 (COQ10) 200 MG CAPS Take 1 capsule by mouth daily.     . cyanocobalamin 1000 MCG tablet Take 2,000 mcg by mouth daily.    Marland Kitchen diltiazem (CARDIZEM SR) 60 MG 12 hr capsule Take 60 mg by mouth daily.     . dorzolamide-timolol (COSOPT) 22.3-6.8 MG/ML ophthalmic solution Place 1  drop into both eyes 2 (two) times daily.      . fluocinonide (LIDEX) 0.05 % external solution Apply 1 application topically once a week. scalp  1  . ipratropium (ATROVENT) 0.03 % nasal spray 1-2 puffs each nostril every 8 hours if needed 30 mL 12  . loratadine (CLARITIN) 10 MG tablet Take 10 mg by mouth daily as needed for allergies.     . Multiple Vitamins-Minerals (PRESERVISION/LUTEIN) CAPS Take 1 capsule by mouth 2 (two) times daily.      . hyaluronate sodium (RADIAPLEXRX) GEL Apply 1 application topically 2 (two) times daily.    . OXYGEN 2LPM     . PROAIR HFA 108 (90 Base) MCG/ACT inhaler inhale 2 puffs by mouth every 6 hours if needed for wheezing or shortness of breath  0   No current facility-administered medications for this visit.     Review of Systems  Constitutional: positive for fatigue Eyes: negative Ears, nose, mouth, throat, and face: negative Respiratory: positive for cough and dyspnea on exertion Cardiovascular:  negative Gastrointestinal: negative Genitourinary:negative Integument/breast: negative Hematologic/lymphatic: negative Musculoskeletal:positive for muscle weakness Neurological: negative Behavioral/Psych: negative Endocrine: negative Allergic/Immunologic: negative  Physical Exam  EVO:JJKKX, healthy, no distress, well developed and malnourished SKIN: skin color, texture, turgor are normal, no rashes or significant lesions HEAD: Normocephalic, No masses, lesions, tenderness or abnormalities EYES: normal, PERRLA EARS: External ears normal OROPHARYNX:no exudate, no erythema and lips, buccal mucosa, and tongue normal  NECK: supple, no adenopathy, no JVD LYMPH:  no palpable lymphadenopathy, no hepatosplenomegaly BREAST:not examined LUNGS: clear to auscultation , and palpation HEART: regular rate & rhythm, no murmurs and no gallops ABDOMEN:abdomen soft, non-tender, normal bowel sounds and no masses or organomegaly BACK: Back symmetric, no curvature., No CVA tenderness EXTREMITIES:no joint deformities, effusion, or inflammation, no edema  NEURO: alert & oriented x 3 with fluent speech, no focal motor/sensory deficits  PERFORMANCE STATUS: ECOG 1-2  LABORATORY DATA: Lab Results  Component Value Date   WBC 7.4 10/04/2016   HGB 12.4 10/04/2016   HCT 39.6 10/04/2016   MCV 95.9 10/04/2016   PLT 228 10/04/2016      Chemistry      Component Value Date/Time   NA 138 10/04/2016 0432   K 4.4 10/04/2016 0432   CL 103 10/04/2016 0432   CO2 25 10/04/2016 0432   BUN 7 10/04/2016 0432   CREATININE 0.65 10/04/2016 0432      Component Value Date/Time   CALCIUM 8.7 (L) 10/04/2016 0432       RADIOGRAPHIC STUDIES: Mr Jeri Cos FG Contrast  Result Date: 10/16/2016 CLINICAL DATA:  New diagnosis of lung cancer. EXAM: MRI HEAD WITHOUT AND WITH CONTRAST TECHNIQUE: Multiplanar, multiecho pulse sequences of the brain and surrounding structures were obtained without and with intravenous  contrast. CONTRAST:  11m MULTIHANCE GADOBENATE DIMEGLUMINE 529 MG/ML IV SOLN COMPARISON:  CT head without contrast 07/29/2015 FINDINGS: Brain: A 1.6 x 1.5 x 1.1 cm dural-based mass lesion is seen along the right sided tentorium lateral to the cerebellum. No other enhancing mass lesion is present. Moderate atrophy and white matter disease is present bilaterally. No acute infarct or hemorrhage is present. The ventricles are proportionate to the degree of atrophy. No significant extra-axial fluid collection is present. Vascular: Flow is present in the major intracranial arteries. Skull and upper cervical spine: The skullbase is within normal limits. Craniocervical junction is normal. Asymmetric left-sided disc osteophyte complex is present at C3-4 with narrowing of the left side of the central canal.  Sinuses/Orbits: Minimal mucosal thickening is present in the anterior ethmoid air cells bilaterally. The remaining paranasal sinuses are clear. The left mastoid air cells are clear. A right mastoid effusion is present. No obstructing nasopharyngeal lesion is present. IMPRESSION: 1. No evidence for metastatic disease to the brain. 2. Dural-based mass lesion along the tentorium is compatible with a meningioma. Electronically Signed   By: San Morelle M.D.   On: 10/16/2016 21:38    ASSESSMENT: This is a very pleasant 83 years old white female with likely synchronous non-small cell lung cancer presenting with a stage IB (T2a, N0, M0) non-small cell lung cancer, adenocarcinoma in the right upper lobe in addition to suspicious stage IA in the left upper lobe diagnosed in February 2018. The molecular study showed no actionable mutations. PDL 1 expression is 10%.  PLAN: I had a lengthy discussion with the patient and her husband today about her current disease stage, prognosis and treatment options. I recommended for the patient to continue with the current treatment of curative radiotherapy to the bilateral  pulmonary mass in the right upper lobe and nodule in the left upper lobe. Unfortunately the patient has no actionable mutation and she is not a good candidate for targeted therapy at this point. Her PDL 1 expression is 10% and the patient is not a good candidate for immunotherapy as frontline treatment at this point. She is not also a good candidate for systemic chemotherapy because of her age and comorbidities. I recommended for her to continue on observation after completion of the curative radiotherapy. She will follow-up with Dr. Sondra Come after her radiation therapy and if the patient develop any disease progression in the future, I would be happy to discuss with her consideration of treatment with immunotherapy if approved by her insurance. The patient and her husband agreed to the current plan. She was advised to call immediately if she has any concerning symptoms in the interval.  The patient voices understanding of current disease status and treatment options and is in agreement with the current care plan.  All questions were answered. The patient knows to call the clinic with any problems, questions or concerns. We can certainly see the patient much sooner if necessary.  Thank you so much for allowing me to participate in the care of Jodi Burns. I will continue to follow up the patient with you and assist in her care.  I spent 40 minutes counseling the patient face to face. The total time spent in the appointment was 60 minutes.  Disclaimer: This note was dictated with voice recognition software. Similar sounding words can inadvertently be transcribed and may not be corrected upon review.   , K. November 07, 2016, 3:22 PM

## 2016-11-07 NOTE — Progress Notes (Signed)
  Radiation Oncology         (336) [phone] ________________________________  Name: Jodi Burns MRN: [id number]  Date: 11/07/2016  DOB: Jun 09, 1926  Weekly Radiation Therapy Management    ICD-9-CM ICD-10-CM   1. Primary cancer of right upper lobe of lung (HCC) 162.3 C34.11      Current Dose: Left:54 Gy     Planned Dose:  Left:54 Gy   Right:30 Gy     Right:50 Gy  Narrative . . . . . . . . The patient presents for routine under treatment assessment.     The patient has completed 6 fractions to the left  lung. She has completed SBRT to the left lung. She is accompanied by her husband today. She denies pain at this time. The patient reports feeling weak, though she thinks she is somewhat stronger that when treatment began. She reports occasional shortness of breath, and attributes this to panic attacks. She reports occasional dry cough and will try Robitussin DM today. The patient denies any skin irritation.                                     The patient is without complaint.                                 Set-up films were reviewed.                                 The chart was checked. Physical Findings. . .  height is '5\' 2"'$  (1.575 m) and weight is 123 lb (55.8 kg). Her oral temperature is 97.5 F (36.4 C). Her blood pressure is 127/54 (abnormal) and her pulse is 78. Her oxygen saturation is 98%.  Weight essentially stable.  No significant changes. Patient presents in a wheelchair.  Impression . . . . . . . The patient is tolerating radiation. Plan . . . . . . . . . . . . Continue treatment as planned.  ________________________________   Blair Promise, PhD, MD  This document serves as a record of services personally performed by Gery Pray, MD. It was created on his behalf by Maryla Morrow, a trained medical scribe. The creation of this record is based on the scribe's personal observations and the provider's statements to them. This document has been checked and approved  by the attending provider.

## 2016-11-07 NOTE — Progress Notes (Signed)
[place] has completed 6 fractions to her left and right lung.  She denies having pain.  She reports that she is feeling weak but thinks she is a little stronger than when she started treatment.  She reports having occasional shortness of breath and thinks it may be due to panic attacks.  She reports having an occasional dry cough and is going to try robitussin DM today.  She denies having any skin irritation.  BP (!) 127/54 (BP Location: Right Arm, Patient Position: Sitting)   Pulse 78   Temp 97.5 F (36.4 C) (Oral)   Ht '5\' 2"'$  (1.575 m)   Wt 123 lb (55.8 kg)   LMP 08/29/1967   SpO2 98%   BMI 22.50 kg/m    Wt Readings from Last 3 Encounters:  11/07/16 123 lb (55.8 kg)  10/31/16 124 lb 6.4 oz (56.4 kg)  10/31/16 124 lb 6.4 oz (56.4 kg)

## 2016-11-08 ENCOUNTER — Ambulatory Visit: Payer: Medicare Other | Admitting: Internal Medicine

## 2016-11-08 ENCOUNTER — Ambulatory Visit
Admission: RE | Admit: 2016-11-08 | Discharge: 2016-11-08 | Disposition: A | Discharge status: Home / Self Care | Payer: Medicare Other | Source: Ambulatory Visit | Attending: Radiation Oncology | Admitting: Radiation Oncology

## 2016-11-08 DIAGNOSIS — Z510 Encounter for antineoplastic radiation therapy: Secondary | ICD-10-CM | POA: Diagnosis not present

## 2016-11-08 DIAGNOSIS — I480 Paroxysmal atrial fibrillation: Secondary | ICD-10-CM | POA: Diagnosis not present

## 2016-11-08 DIAGNOSIS — J45909 Unspecified asthma, uncomplicated: Secondary | ICD-10-CM | POA: Diagnosis not present

## 2016-11-08 DIAGNOSIS — I10 Essential (primary) hypertension: Secondary | ICD-10-CM | POA: Diagnosis not present

## 2016-11-08 DIAGNOSIS — C3411 Malignant neoplasm of upper lobe, right bronchus or lung: Secondary | ICD-10-CM

## 2016-11-08 DIAGNOSIS — C3412 Malignant neoplasm of upper lobe, left bronchus or lung: Secondary | ICD-10-CM | POA: Diagnosis not present

## 2016-11-08 DIAGNOSIS — J439 Emphysema, unspecified: Secondary | ICD-10-CM | POA: Diagnosis not present

## 2016-11-08 DIAGNOSIS — M6281 Muscle weakness (generalized): Secondary | ICD-10-CM | POA: Diagnosis not present

## 2016-11-08 DIAGNOSIS — R2689 Other abnormalities of gait and mobility: Secondary | ICD-10-CM | POA: Diagnosis not present

## 2016-11-09 ENCOUNTER — Ambulatory Visit
Admission: RE | Admit: 2016-11-09 | Discharge: 2016-11-09 | Disposition: A | Discharge status: Home / Self Care | Payer: Medicare Other | Source: Ambulatory Visit | Attending: Radiation Oncology | Admitting: Radiation Oncology

## 2016-11-09 ENCOUNTER — Encounter: Payer: Self-pay | Admitting: *Deleted

## 2016-11-09 DIAGNOSIS — I10 Essential (primary) hypertension: Secondary | ICD-10-CM | POA: Diagnosis not present

## 2016-11-09 DIAGNOSIS — I480 Paroxysmal atrial fibrillation: Secondary | ICD-10-CM | POA: Diagnosis not present

## 2016-11-09 DIAGNOSIS — M6281 Muscle weakness (generalized): Secondary | ICD-10-CM | POA: Diagnosis not present

## 2016-11-09 DIAGNOSIS — C3411 Malignant neoplasm of upper lobe, right bronchus or lung: Secondary | ICD-10-CM

## 2016-11-09 DIAGNOSIS — J439 Emphysema, unspecified: Secondary | ICD-10-CM | POA: Diagnosis not present

## 2016-11-09 DIAGNOSIS — R2689 Other abnormalities of gait and mobility: Secondary | ICD-10-CM | POA: Diagnosis not present

## 2016-11-09 DIAGNOSIS — Z510 Encounter for antineoplastic radiation therapy: Secondary | ICD-10-CM | POA: Diagnosis not present

## 2016-11-09 DIAGNOSIS — J45909 Unspecified asthma, uncomplicated: Secondary | ICD-10-CM | POA: Diagnosis not present

## 2016-11-09 DIAGNOSIS — C3412 Malignant neoplasm of upper lobe, left bronchus or lung: Secondary | ICD-10-CM | POA: Diagnosis not present

## 2016-11-09 NOTE — Progress Notes (Signed)
Oncology Nurse Navigator Documentation  Oncology Nurse Navigator Flowsheets 11/09/2016  Navigator Location [facility]  Navigator Encounter Type Clinic/MDC/I spoke with Dr and Jodi Burns on 11/07/16 after their visit with Dr. Julien Nordmann.  No barriers identified at this time.   Patient Visit Type MedOnc  Treatment Phase Treatment  Barriers/Navigation Needs No barriers at this time  Interventions None required  Acuity Level 1  Acuity Level 1 Minimal follow up required  Time Spent with Patient 15

## 2016-11-10 ENCOUNTER — Ambulatory Visit
Admission: RE | Admit: 2016-11-10 | Discharge: 2016-11-10 | Disposition: A | Discharge status: Home / Self Care | Payer: Medicare Other | Source: Ambulatory Visit | Attending: Radiation Oncology | Admitting: Radiation Oncology

## 2016-11-10 DIAGNOSIS — C3411 Malignant neoplasm of upper lobe, right bronchus or lung: Secondary | ICD-10-CM | POA: Diagnosis not present

## 2016-11-10 DIAGNOSIS — Z510 Encounter for antineoplastic radiation therapy: Secondary | ICD-10-CM | POA: Diagnosis not present

## 2016-11-10 DIAGNOSIS — C3412 Malignant neoplasm of upper lobe, left bronchus or lung: Secondary | ICD-10-CM | POA: Diagnosis not present

## 2016-11-13 ENCOUNTER — Telehealth: Payer: Self-pay | Admitting: Oncology

## 2016-11-13 ENCOUNTER — Ambulatory Visit
Admission: RE | Admit: 2016-11-13 | Discharge: 2016-11-13 | Disposition: A | Discharge status: Home / Self Care | Payer: Medicare Other | Source: Ambulatory Visit | Attending: Radiation Oncology | Admitting: Radiation Oncology

## 2016-11-13 NOTE — Telephone Encounter (Signed)
Called [name] regarding Jaley canceling treatment today.  He said Jalonda is feeling very weak today and does not think she could make it for treatment.  He said her vital signs are all normal and she is not short of breath or coughing.  He said she will be in for treatment tomorrow.

## 2016-11-14 ENCOUNTER — Encounter: Payer: Self-pay | Admitting: Radiation Oncology

## 2016-11-14 ENCOUNTER — Inpatient Hospital Stay
Admission: RE | Admit: 2016-11-14 | Discharge: 2016-11-14 | Disposition: A | Discharge status: Home / Self Care | Payer: Self-pay | Source: Ambulatory Visit | Attending: Radiation Oncology | Admitting: Radiation Oncology

## 2016-11-14 ENCOUNTER — Ambulatory Visit: Payer: Medicare Other | Admitting: Radiation Oncology

## 2016-11-14 ENCOUNTER — Ambulatory Visit
Admission: RE | Admit: 2016-11-14 | Discharge: 2016-11-14 | Disposition: A | Discharge status: Home / Self Care | Payer: Medicare Other | Source: Ambulatory Visit | Attending: Radiation Oncology | Admitting: Radiation Oncology

## 2016-11-14 VITALS — BP 128/84 | HR 78 | Temp 97.9°F | Ht 62.0 in | Wt 124.0 lb

## 2016-11-14 DIAGNOSIS — C3411 Malignant neoplasm of upper lobe, right bronchus or lung: Secondary | ICD-10-CM | POA: Diagnosis not present

## 2016-11-14 DIAGNOSIS — Z510 Encounter for antineoplastic radiation therapy: Secondary | ICD-10-CM | POA: Diagnosis not present

## 2016-11-14 DIAGNOSIS — C3412 Malignant neoplasm of upper lobe, left bronchus or lung: Secondary | ICD-10-CM | POA: Diagnosis not present

## 2016-11-14 NOTE — Progress Notes (Signed)
  Radiation Oncology         [phone] ________________________________  Name: Jodi Burns MRN: [id number]  Date: 11/14/2016  DOB: 1925/11/16  End of Treatment Note  Diagnosis:      Stage IB (T2a, N0, M0) non-small cell lung cancer, adenocarcinoma in the right upper lobe in addition to suspicious stage IA in the left upper lobe diagnosed in February 2018.  Indication for treatment:  Curative       Radiation treatment dates:   10/31/2016 - 11/13/2016  Site/dose:   Left upper lung treated to 54 Gy at 3 fx                      Right upper lung to 50 Gy at 10 fx  Beams/energy:    Left upper lung SBRT/SRT-VMAT // 6FFF  Right upper lung SBRT/SRT-VMAT // 6FFF   Narrative: The patient tolerated radiation treatment relatively well.   Pt denied having any pain but did endorse weakness. This was cited as reason for not coming to treatment one day. Pt reported occasional dry cough. She also endorsed SOB from panic attacks. Pt denied any skin irritation.  Plan: The patient has completed radiation treatment. The patient will return to radiation oncology clinic for routine followup in one month. I advised them to call or return sooner if they have any questions or concerns related to their recovery or treatment.  -----------------------------------  Blair Promise, PhD, MD  This document serves as a record of services personally performed by Gery Pray, MD. It was created on his behalf by Maryla Morrow, a trained medical scribe. The creation of this record is based on the scribe's personal observations and the provider's statements to them. This document has been checked and approved by the attending provider.

## 2016-11-14 NOTE — Progress Notes (Signed)
Jodi Burns has completed treatment with 3 fractions to her left lung and 10 fractions to her right lung.  She denies having any pain.  She is feeling weaker and was not able to come for treatment yesterday.  She reports having an occasionaly dry cough.  She continues to report having shortness of breath from panic attacks.  She denies having any skin irritation.  BP 128/84 (BP Location: Right Arm, Patient Position: Sitting)   Pulse 78   Temp 97.9 F (36.6 C) (Oral)   Ht '5\' 2"'$  (1.575 m)   Wt 124 lb (56.2 kg)   LMP 08/29/1967   SpO2 99%   BMI 22.68 kg/m    Wt Readings from Last 3 Encounters:  11/14/16 124 lb (56.2 kg)  11/07/16 123 lb (55.8 kg)  11/07/16 123 lb (55.8 kg)

## 2016-11-14 NOTE — Progress Notes (Signed)
  Radiation Oncology         [phone] ________________________________  Name: Jodi Burns MRN: [id number]  Date: 11/14/2016  DOB: 1925/12/27  Weekly Radiation Therapy Management    ICD-9-CM ICD-10-CM   1. Primary cancer of right upper lobe of lung (HCC) 162.3 C34.11      Current Dose: Left:54 Gy     Planned Dose:  Left:54 Gy   Right:50 Gy     Right:50 Gy  Narrative . . . . . . . . The patient presents for routine under treatment assessment.     Jodi Burns has completed treatment with 3 fractions to her left lung and 10 fractions to her right lung.  She denies having any pain.  She is feeling weaker and was not able to come for treatment yesterday.  She reports having an occasionaly dry cough.  She continues to report having shortness of breath from panic attacks.  She denies having any skin irritation.                                     The patient is without complaint.                                 Set-up films were reviewed.                                 The chart was checked. Physical Findings. . .  height is '5\' 2"'$  (1.575 m) and weight is 124 lb (56.2 kg). Her oral temperature is 97.9 F (36.6 C). Her blood pressure is 128/84 and her pulse is 78. Her oxygen saturation is 99%.  Weight essentially stable.  No significant changes. Patient presents in a wheelchair. The lungs are clear. The heart has a regular rhythm and rate. Impression . . . . . . . The patient has tolerated radiation. Plan . . . . . . . . . . . . Patient has completed radiation. She will follow up in 1 month. The patient will contact us in the interim with any questions that may arise.  ________________________________   Blair Promise, PhD, MD  This document serves as a record of services personally performed by Gery Pray, MD. It was created on his behalf by Maryla Morrow, a trained medical scribe. The creation of this record is based on the scribe's personal observations and the provider's  statements to them. This document has been checked and approved by the attending provider.

## 2016-11-15 ENCOUNTER — Ambulatory Visit: Payer: Medicare Other | Admitting: Radiation Oncology

## 2016-11-15 ENCOUNTER — Telehealth: Payer: Self-pay | Admitting: Oncology

## 2016-11-15 DIAGNOSIS — I480 Paroxysmal atrial fibrillation: Secondary | ICD-10-CM | POA: Diagnosis not present

## 2016-11-15 DIAGNOSIS — I10 Essential (primary) hypertension: Secondary | ICD-10-CM | POA: Diagnosis not present

## 2016-11-15 DIAGNOSIS — R2689 Other abnormalities of gait and mobility: Secondary | ICD-10-CM | POA: Diagnosis not present

## 2016-11-15 DIAGNOSIS — J439 Emphysema, unspecified: Secondary | ICD-10-CM | POA: Diagnosis not present

## 2016-11-15 DIAGNOSIS — M6281 Muscle weakness (generalized): Secondary | ICD-10-CM | POA: Diagnosis not present

## 2016-11-15 DIAGNOSIS — J45909 Unspecified asthma, uncomplicated: Secondary | ICD-10-CM | POA: Diagnosis not present

## 2016-11-16 ENCOUNTER — Ambulatory Visit: Payer: Medicare Other | Admitting: Radiation Oncology

## 2016-11-17 ENCOUNTER — Ambulatory Visit: Payer: Medicare Other | Admitting: Radiation Oncology

## 2016-11-17 DIAGNOSIS — M6281 Muscle weakness (generalized): Secondary | ICD-10-CM | POA: Diagnosis not present

## 2016-11-17 DIAGNOSIS — R2689 Other abnormalities of gait and mobility: Secondary | ICD-10-CM | POA: Diagnosis not present

## 2016-11-17 DIAGNOSIS — J45909 Unspecified asthma, uncomplicated: Secondary | ICD-10-CM | POA: Diagnosis not present

## 2016-11-17 DIAGNOSIS — J439 Emphysema, unspecified: Secondary | ICD-10-CM | POA: Diagnosis not present

## 2016-11-17 DIAGNOSIS — I10 Essential (primary) hypertension: Secondary | ICD-10-CM | POA: Diagnosis not present

## 2016-11-17 DIAGNOSIS — I480 Paroxysmal atrial fibrillation: Secondary | ICD-10-CM | POA: Diagnosis not present

## 2016-11-21 DIAGNOSIS — R2689 Other abnormalities of gait and mobility: Secondary | ICD-10-CM | POA: Diagnosis not present

## 2016-11-21 DIAGNOSIS — J45909 Unspecified asthma, uncomplicated: Secondary | ICD-10-CM | POA: Diagnosis not present

## 2016-11-21 DIAGNOSIS — I480 Paroxysmal atrial fibrillation: Secondary | ICD-10-CM | POA: Diagnosis not present

## 2016-11-21 DIAGNOSIS — M6281 Muscle weakness (generalized): Secondary | ICD-10-CM | POA: Diagnosis not present

## 2016-11-21 DIAGNOSIS — I10 Essential (primary) hypertension: Secondary | ICD-10-CM | POA: Diagnosis not present

## 2016-11-21 DIAGNOSIS — J439 Emphysema, unspecified: Secondary | ICD-10-CM | POA: Diagnosis not present

## 2016-11-23 DIAGNOSIS — I10 Essential (primary) hypertension: Secondary | ICD-10-CM | POA: Diagnosis not present

## 2016-11-23 DIAGNOSIS — R2689 Other abnormalities of gait and mobility: Secondary | ICD-10-CM | POA: Diagnosis not present

## 2016-11-23 DIAGNOSIS — I480 Paroxysmal atrial fibrillation: Secondary | ICD-10-CM | POA: Diagnosis not present

## 2016-11-23 DIAGNOSIS — J45909 Unspecified asthma, uncomplicated: Secondary | ICD-10-CM | POA: Diagnosis not present

## 2016-11-23 DIAGNOSIS — J439 Emphysema, unspecified: Secondary | ICD-10-CM | POA: Diagnosis not present

## 2016-11-23 DIAGNOSIS — M6281 Muscle weakness (generalized): Secondary | ICD-10-CM | POA: Diagnosis not present

## 2016-11-28 DIAGNOSIS — J439 Emphysema, unspecified: Secondary | ICD-10-CM | POA: Diagnosis not present

## 2016-11-28 DIAGNOSIS — R2689 Other abnormalities of gait and mobility: Secondary | ICD-10-CM | POA: Diagnosis not present

## 2016-11-28 DIAGNOSIS — I480 Paroxysmal atrial fibrillation: Secondary | ICD-10-CM | POA: Diagnosis not present

## 2016-11-28 DIAGNOSIS — M6281 Muscle weakness (generalized): Secondary | ICD-10-CM | POA: Diagnosis not present

## 2016-11-28 DIAGNOSIS — I10 Essential (primary) hypertension: Secondary | ICD-10-CM | POA: Diagnosis not present

## 2016-11-28 DIAGNOSIS — J45909 Unspecified asthma, uncomplicated: Secondary | ICD-10-CM | POA: Diagnosis not present

## 2016-11-29 DIAGNOSIS — Z111 Encounter for screening for respiratory tuberculosis: Secondary | ICD-10-CM | POA: Diagnosis not present

## 2016-11-30 DIAGNOSIS — J439 Emphysema, unspecified: Secondary | ICD-10-CM | POA: Diagnosis not present

## 2016-11-30 DIAGNOSIS — J45909 Unspecified asthma, uncomplicated: Secondary | ICD-10-CM | POA: Diagnosis not present

## 2016-11-30 DIAGNOSIS — R2689 Other abnormalities of gait and mobility: Secondary | ICD-10-CM | POA: Diagnosis not present

## 2016-11-30 DIAGNOSIS — I10 Essential (primary) hypertension: Secondary | ICD-10-CM | POA: Diagnosis not present

## 2016-11-30 DIAGNOSIS — M6281 Muscle weakness (generalized): Secondary | ICD-10-CM | POA: Diagnosis not present

## 2016-11-30 DIAGNOSIS — I480 Paroxysmal atrial fibrillation: Secondary | ICD-10-CM | POA: Diagnosis not present

## 2016-12-05 DIAGNOSIS — I480 Paroxysmal atrial fibrillation: Secondary | ICD-10-CM | POA: Diagnosis not present

## 2016-12-05 DIAGNOSIS — R2689 Other abnormalities of gait and mobility: Secondary | ICD-10-CM | POA: Diagnosis not present

## 2016-12-05 DIAGNOSIS — M6281 Muscle weakness (generalized): Secondary | ICD-10-CM | POA: Diagnosis not present

## 2016-12-05 DIAGNOSIS — J45909 Unspecified asthma, uncomplicated: Secondary | ICD-10-CM | POA: Diagnosis not present

## 2016-12-05 DIAGNOSIS — I10 Essential (primary) hypertension: Secondary | ICD-10-CM | POA: Diagnosis not present

## 2016-12-05 DIAGNOSIS — J439 Emphysema, unspecified: Secondary | ICD-10-CM | POA: Diagnosis not present

## 2016-12-06 ENCOUNTER — Ambulatory Visit (INDEPENDENT_AMBULATORY_CARE_PROVIDER_SITE_OTHER): Payer: Medicare Other | Admitting: Podiatry

## 2016-12-06 ENCOUNTER — Encounter: Payer: Self-pay | Admitting: Podiatry

## 2016-12-06 VITALS — Resp 16 | Ht 62.75 in | Wt 118.0 lb

## 2016-12-06 DIAGNOSIS — B351 Tinea unguium: Secondary | ICD-10-CM

## 2016-12-06 DIAGNOSIS — M79604 Pain in right leg: Secondary | ICD-10-CM

## 2016-12-06 DIAGNOSIS — M79605 Pain in left leg: Secondary | ICD-10-CM

## 2016-12-06 NOTE — Progress Notes (Signed)
   Subjective:    Patient ID: Jodi Burns, female    DOB: 1925/12/14, 83 y.o.   MRN: [id number]  HPI  Chief Complaint  Patient presents with  . Debridement    Bilateral nail trim     Review of Systems  All other systems reviewed and are negative.      Objective:   Physical Exam        Assessment & Plan:

## 2016-12-07 DIAGNOSIS — M6281 Muscle weakness (generalized): Secondary | ICD-10-CM | POA: Diagnosis not present

## 2016-12-07 DIAGNOSIS — J439 Emphysema, unspecified: Secondary | ICD-10-CM | POA: Diagnosis not present

## 2016-12-07 DIAGNOSIS — I10 Essential (primary) hypertension: Secondary | ICD-10-CM | POA: Diagnosis not present

## 2016-12-07 DIAGNOSIS — J45909 Unspecified asthma, uncomplicated: Secondary | ICD-10-CM | POA: Diagnosis not present

## 2016-12-07 DIAGNOSIS — R2689 Other abnormalities of gait and mobility: Secondary | ICD-10-CM | POA: Diagnosis not present

## 2016-12-07 DIAGNOSIS — I480 Paroxysmal atrial fibrillation: Secondary | ICD-10-CM | POA: Diagnosis not present

## 2016-12-08 NOTE — Progress Notes (Signed)
Subjective:     Patient ID: Jodi Burns, female   DOB: 13-Nov-1925, 83 y.o.   MRN: [id number]  HPI patient presents with caregiver stating that she has very painful nailbeds 1-5 both feet that they're not able to cut and they're causing irritation with shoe gear and walking   Review of Systems  All other systems reviewed and are negative.      Objective:   Physical Exam  Constitutional: She is oriented to person, place, and time.  Cardiovascular: Intact distal pulses.   Musculoskeletal: Normal range of motion.  Neurological: She is oriented to person, place, and time.  Skin: Skin is warm and dry.  Nursing note and vitals reviewed.  neurovascular status was found to be diminished but intact with diminished range of motion subtalar midtarsal joint and equinus condition bilateral. Patient's found to have significant nail thickness 1-5 both feet that are dystrophic painful and impossible for patient to wear shoe gear with due to length and thickness     Assessment:     Chronic mycotic nail infection 1-5 both feet with pain    Plan:     H&P conditions reviewed debridement accomplished today with no iatrogenic bleeding and reappoint for recheck

## 2016-12-15 ENCOUNTER — Encounter: Payer: Self-pay | Admitting: Oncology

## 2016-12-18 ENCOUNTER — Ambulatory Visit: Admission: RE | Admit: 2016-12-18 | Payer: Medicare Other | Source: Ambulatory Visit | Admitting: Radiation Oncology

## 2016-12-25 ENCOUNTER — Encounter: Payer: Self-pay | Admitting: Radiation Oncology

## 2016-12-25 ENCOUNTER — Ambulatory Visit
Admission: RE | Admit: 2016-12-25 | Discharge: 2016-12-25 | Disposition: A | Discharge status: Home / Self Care | Payer: Medicare Other | Source: Ambulatory Visit | Attending: Radiation Oncology | Admitting: Radiation Oncology

## 2016-12-25 VITALS — BP 142/63 | HR 69 | Temp 98.3°F | Ht 62.75 in | Wt 124.2 lb

## 2016-12-25 DIAGNOSIS — Z7901 Long term (current) use of anticoagulants: Secondary | ICD-10-CM | POA: Insufficient documentation

## 2016-12-25 DIAGNOSIS — Z79899 Other long term (current) drug therapy: Secondary | ICD-10-CM | POA: Diagnosis not present

## 2016-12-25 DIAGNOSIS — C3411 Malignant neoplasm of upper lobe, right bronchus or lung: Secondary | ICD-10-CM

## 2016-12-25 DIAGNOSIS — R918 Other nonspecific abnormal finding of lung field: Secondary | ICD-10-CM | POA: Insufficient documentation

## 2016-12-25 DIAGNOSIS — R5383 Other fatigue: Secondary | ICD-10-CM | POA: Insufficient documentation

## 2016-12-25 DIAGNOSIS — R0600 Dyspnea, unspecified: Secondary | ICD-10-CM | POA: Insufficient documentation

## 2016-12-25 DIAGNOSIS — Z885 Allergy status to narcotic agent status: Secondary | ICD-10-CM | POA: Diagnosis not present

## 2016-12-25 NOTE — Progress Notes (Signed)
Radiation Oncology         [phone]) [phone] ________________________________  Name: Jodi Burns MRN: [id number]  Date: 12/25/2016  DOB: June 13, 1926  Follow-Up Visit Note  CC: Jerlyn Ly, MD  Melrose Nakayama, *    ICD-9-CM ICD-10-CM   1. Primary cancer of right upper lobe of lung (HCC) 162.3 C34.11 CT Chest Wo Contrast  2. Mass of upper lobe of left lung 786.6 R91.8     Diagnosis:   Stage IB (T2a, N0, M0) non-small cell lung cancer, adenocarcinoma in the right upper lobe in addition to suspicious stage IA lesion in the left upper lobe diagnosed in February 2018  Interval Since Last Radiation:  5 weeks  10/31/2016 - 11/13/2016 Site/dose:   Left upper lung treated to 54 Gy at 3 fx                      Right upper lung to 50 Gy at 10 fx  Narrative:  The patient returns today for routine follow-up. The patient denies chest pain. She has occasional SOB and says her O2 saturation does not change when it happens. She also has an occasional dry cough and gets fatigued easily. She has to use a walker in order to ambulate. She has oxygen at home that she uses on occasion.  ALLERGIES:  is allergic to cefaclor and codeine.  Meds: Current Outpatient Prescriptions  Medication Sig Dispense Refill  . acetaminophen (TYLENOL) 325 MG tablet Take 650 mg by mouth every 6 (six) hours as needed for mild pain.     Marland Kitchen amiodarone (PACERONE) 200 MG tablet Take 1 tablet (200 mg total) by mouth daily. 30 tablet 0  . apixaban (ELIQUIS) 2.5 MG TABS tablet Take 1 tablet (2.5 mg total) by mouth 2 (two) times daily. 60 tablet 0  . budesonide-formoterol (SYMBICORT) 160-4.5 MCG/ACT inhaler Inhale 2 puffs into the lungs 2 (two) times daily. Rinse mouth (Patient taking differently: Inhale 1 puff into the lungs 2 (two) times daily. Rinse mouth) 10.2 g 11  . Cholecalciferol (VITAMIN D3) 5000 units TABS Take 2 tablets by mouth daily.    . Coenzyme Q10 (COQ10) 200 MG CAPS Take 1 capsule by mouth daily.     .  cyanocobalamin 1000 MCG tablet Take 2,000 mcg by mouth daily.    Marland Kitchen diltiazem (CARDIZEM SR) 60 MG 12 hr capsule Take 60 mg by mouth daily.     . dorzolamide-timolol (COSOPT) 22.3-6.8 MG/ML ophthalmic solution Place 1 drop into both eyes 2 (two) times daily.      . fluocinonide (LIDEX) 0.05 % external solution Apply 1 application topically once a week. scalp  1  . ipratropium (ATROVENT) 0.03 % nasal spray 1-2 puffs each nostril every 8 hours if needed 30 mL 12  . loratadine (CLARITIN) 10 MG tablet Take 10 mg by mouth daily as needed for allergies.     . Multiple Vitamins-Minerals (PRESERVISION/LUTEIN) CAPS Take 1 capsule by mouth 2 (two) times daily.      Marland Kitchen PROAIR HFA 108 (90 Base) MCG/ACT inhaler inhale 2 puffs by mouth every 6 hours if needed for wheezing or shortness of breath  0  . hyaluronate sodium (RADIAPLEXRX) GEL Apply 1 application topically 2 (two) times daily.    . OXYGEN 2LPM      No current facility-administered medications for this encounter.     Physical Findings: The patient is in no acute distress. Patient is alert and oriented.  height is 5' 2.75" (1.594 m)  and weight is 124 lb 3.2 oz (56.3 kg). Her oral temperature is 98.3 F (36.8 C). Her blood pressure is 142/63 (abnormal) and her pulse is 69. Her oxygen saturation is 99%.   Ambulatory with a walker. In no respiratory distress. Lungs are clear to auscultation bilaterally. Heart has regular rate and rhythm. No palpable cervical, supraclavicular, or axillary adenopathy  Lab Findings: Lab Results  Component Value Date   WBC 7.4 10/04/2016   HGB 12.4 10/04/2016   HCT 39.6 10/04/2016   MCV 95.9 10/04/2016   PLT 228 10/04/2016    Radiographic Findings: No results found.  Impression:  Stage IB (T2a, N0, M0) non-small cell lung cancer, adenocarcinoma in the right upper lobe in addition to suspicious stage IA in the left upper lobe diagnosed in February 2018  The patient is continuing to recover from radiation. Her major  complaints are occasional dyspnea and continuing fatigue.  Plan: The patient will be scheduled for a CT scan of the chest in June and we will call her of the results. The patient will follow up in 3 months.  ____________________________________ -----------------------------------  Blair Promise, PhD, MD  This document serves as a record of services personally performed by Gery Pray, MD. It was created on his behalf by Darcus Austin, a trained medical scribe. The creation of this record is based on the scribe's personal observations and the provider's statements to them. This document has been checked and approved by the attending provider.

## 2016-12-25 NOTE — Progress Notes (Signed)
Jodi Burns is here for follow up.  She denies having pain.  She reports having occasional shortness of breath and said her O2 saturation does not change when it happens.  She having an occasional dry cough.  She reports getting tired very easily.  BP (!) 142/63 (BP Location: Left Arm, Patient Position: Sitting)   Pulse 69   Temp 98.3 F (36.8 C) (Oral)   Ht 5' 2.75" (1.594 m)   Wt 124 lb 3.2 oz (56.3 kg)   LMP 08/29/1967   SpO2 99%   BMI 22.18 kg/m    Wt Readings from Last 3 Encounters:  12/25/16 124 lb 3.2 oz (56.3 kg)  12/06/16 118 lb (53.5 kg)  11/14/16 124 lb (56.2 kg)

## 2016-12-29 DIAGNOSIS — H353134 Nonexudative age-related macular degeneration, bilateral, advanced atrophic with subfoveal involvement: Secondary | ICD-10-CM | POA: Diagnosis not present

## 2016-12-29 DIAGNOSIS — H31003 Unspecified chorioretinal scars, bilateral: Secondary | ICD-10-CM | POA: Diagnosis not present

## 2016-12-29 DIAGNOSIS — H401132 Primary open-angle glaucoma, bilateral, moderate stage: Secondary | ICD-10-CM | POA: Diagnosis not present

## 2016-12-29 DIAGNOSIS — Z961 Presence of intraocular lens: Secondary | ICD-10-CM | POA: Diagnosis not present

## 2017-01-11 DIAGNOSIS — I480 Paroxysmal atrial fibrillation: Secondary | ICD-10-CM | POA: Diagnosis not present

## 2017-01-11 DIAGNOSIS — D519 Vitamin B12 deficiency anemia, unspecified: Secondary | ICD-10-CM | POA: Diagnosis not present

## 2017-01-11 DIAGNOSIS — E559 Vitamin D deficiency, unspecified: Secondary | ICD-10-CM | POA: Diagnosis not present

## 2017-01-11 DIAGNOSIS — R8299 Other abnormal findings in urine: Secondary | ICD-10-CM | POA: Diagnosis not present

## 2017-01-11 DIAGNOSIS — N390 Urinary tract infection, site not specified: Secondary | ICD-10-CM | POA: Diagnosis not present

## 2017-01-11 DIAGNOSIS — D518 Other vitamin B12 deficiency anemias: Secondary | ICD-10-CM | POA: Diagnosis not present

## 2017-01-11 DIAGNOSIS — E784 Other hyperlipidemia: Secondary | ICD-10-CM | POA: Diagnosis not present

## 2017-01-15 ENCOUNTER — Telehealth: Payer: Self-pay | Admitting: *Deleted

## 2017-01-15 NOTE — Telephone Encounter (Signed)
CALLED PATIENT TO INFORM OF CT FOR 02-05-17, - ARRIVAL TIME - 10:45 AM @ [facility], NO RESTRICTIONS TO TEST DUE TO ORER WITHOUT CONTRAST, SPOKE WITH PATIENT'S HUSBAND CARL AND HE IS AWARE OF THIS SCAN

## 2017-01-18 DIAGNOSIS — I700 Atherosclerosis of aorta: Secondary | ICD-10-CM | POA: Diagnosis not present

## 2017-01-18 DIAGNOSIS — C3490 Malignant neoplasm of unspecified part of unspecified bronchus or lung: Secondary | ICD-10-CM | POA: Diagnosis not present

## 2017-01-18 DIAGNOSIS — D518 Other vitamin B12 deficiency anemias: Secondary | ICD-10-CM | POA: Diagnosis not present

## 2017-01-18 DIAGNOSIS — H9193 Unspecified hearing loss, bilateral: Secondary | ICD-10-CM | POA: Diagnosis not present

## 2017-01-18 DIAGNOSIS — R531 Weakness: Secondary | ICD-10-CM | POA: Diagnosis not present

## 2017-01-18 DIAGNOSIS — J45998 Other asthma: Secondary | ICD-10-CM | POA: Diagnosis not present

## 2017-01-18 DIAGNOSIS — E784 Other hyperlipidemia: Secondary | ICD-10-CM | POA: Diagnosis not present

## 2017-01-18 DIAGNOSIS — B351 Tinea unguium: Secondary | ICD-10-CM | POA: Diagnosis not present

## 2017-01-18 DIAGNOSIS — R2689 Other abnormalities of gait and mobility: Secondary | ICD-10-CM | POA: Diagnosis not present

## 2017-01-18 DIAGNOSIS — I480 Paroxysmal atrial fibrillation: Secondary | ICD-10-CM | POA: Diagnosis not present

## 2017-01-18 DIAGNOSIS — Z0000 Encounter for general adult medical examination without abnormal findings: Secondary | ICD-10-CM | POA: Diagnosis not present

## 2017-01-18 DIAGNOSIS — Z6822 Body mass index (BMI) 22.0-22.9, adult: Secondary | ICD-10-CM | POA: Diagnosis not present

## 2017-01-24 ENCOUNTER — Ambulatory Visit (HOSPITAL_COMMUNITY)
Admission: RE | Admit: 2017-01-24 | Discharge: 2017-01-24 | Disposition: A | Discharge status: Home / Self Care | Payer: Medicare Other | Source: Ambulatory Visit | Attending: Nurse Practitioner | Admitting: Nurse Practitioner

## 2017-01-24 ENCOUNTER — Encounter (HOSPITAL_COMMUNITY): Payer: Self-pay | Admitting: Nurse Practitioner

## 2017-01-24 VITALS — BP 146/70 | HR 76 | Ht 62.75 in | Wt 126.0 lb

## 2017-01-24 DIAGNOSIS — M545 Low back pain: Secondary | ICD-10-CM | POA: Diagnosis not present

## 2017-01-24 DIAGNOSIS — Z87891 Personal history of nicotine dependence: Secondary | ICD-10-CM | POA: Diagnosis not present

## 2017-01-24 DIAGNOSIS — I444 Left anterior fascicular block: Secondary | ICD-10-CM | POA: Insufficient documentation

## 2017-01-24 DIAGNOSIS — E559 Vitamin D deficiency, unspecified: Secondary | ICD-10-CM | POA: Diagnosis not present

## 2017-01-24 DIAGNOSIS — M8580 Other specified disorders of bone density and structure, unspecified site: Secondary | ICD-10-CM | POA: Diagnosis not present

## 2017-01-24 DIAGNOSIS — I4510 Unspecified right bundle-branch block: Secondary | ICD-10-CM | POA: Diagnosis not present

## 2017-01-24 DIAGNOSIS — C3490 Malignant neoplasm of unspecified part of unspecified bronchus or lung: Secondary | ICD-10-CM | POA: Diagnosis not present

## 2017-01-24 DIAGNOSIS — G8929 Other chronic pain: Secondary | ICD-10-CM | POA: Diagnosis not present

## 2017-01-24 DIAGNOSIS — I959 Hypotension, unspecified: Secondary | ICD-10-CM | POA: Diagnosis not present

## 2017-01-24 DIAGNOSIS — I4891 Unspecified atrial fibrillation: Secondary | ICD-10-CM | POA: Insufficient documentation

## 2017-01-24 DIAGNOSIS — E785 Hyperlipidemia, unspecified: Secondary | ICD-10-CM | POA: Diagnosis not present

## 2017-01-24 DIAGNOSIS — I517 Cardiomegaly: Secondary | ICD-10-CM | POA: Diagnosis not present

## 2017-01-24 DIAGNOSIS — Z86718 Personal history of other venous thrombosis and embolism: Secondary | ICD-10-CM | POA: Insufficient documentation

## 2017-01-24 DIAGNOSIS — Z9981 Dependence on supplemental oxygen: Secondary | ICD-10-CM | POA: Insufficient documentation

## 2017-01-24 DIAGNOSIS — J45909 Unspecified asthma, uncomplicated: Secondary | ICD-10-CM | POA: Insufficient documentation

## 2017-01-24 NOTE — Progress Notes (Signed)
Primary Care Physician: Crist Infante, MD Referring Physician: Dr. Marvetta Gibbons Jodi Burns is a 83 y.o. female with a h/o hospitalization in January in the setting of a URI and afib with RVR. She was very unstable in AFib with hypotension which made it hard to rate control her and with associated acute on chronic heart failure. She had urgent cardioversion and then was loaded on amiodarone. At that time pt was also found to have lung cancer for which she has undergone radiation. She is in the afib clinic for evaluation on ongoing amiodarone use. Pt has been staying in SR and does not appear to have any side effects from amiodarone so far. She is pending further evaluation of effect of radiation on her lung mass. Most of the history is from her husband who is a retired Lexicographer from this area.  Today, she denies symptoms of palpitations, chest pain, shortness of breath, orthopnea, PND, lower extremity edema, dizziness, presyncope, syncope, or neurologic sequela.  Positive for fatigue.The patient is tolerating medications without difficulties and is otherwise without complaint today.   Past Medical History:  Diagnosis Date  . Allergic rhinitis   . Arthritis    "the normal for my age" (10/03/2016)  . Asthma    "very mild" (10/03/2016)  . Atrial fibrillation ([place])   . Chronic lower back pain   . Dyspnea on exertion   . History of blood transfusion    "I was young; don't remember why I needed it"  . History of external beam radiation therapy 10/31/16-11/13/16   left upper lung treated to 54 Gy in 3 fractions, right upper lung to 50 Gy in 10 fractions  . 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 On home oxygen therapy    "available; not needed it yet" (10/03/2016)  . Osteopenia   . Other and unspecified hyperlipidemia   . Other retinal disorders(362.89)   . Personal history of other diseases of circulatory system   . Personal history  of venous thrombosis and embolism 4/94  . Reactive airway disease   . Subjective visual disturbance, unspecified   . Vitamin D deficiency    Past Surgical History:  Procedure Laterality Date  . APPENDECTOMY    . BREAST BIOPSY Bilateral   . CARDIOVASCULAR STRESS TEST  August 2013   CPET-MET: Submaximal effort, did not reach anaerobic threshold --> peak VO2 76% (not overly concerning for CAD)  . CARDIOVERSION N/A 08/29/2016   Procedure: CARDIOVERSION;  Surgeon: Sanda Klein, MD;  Location: [place];  Service: Cardiovascular;  Laterality: N/A;  . CARPAL TUNNEL RELEASE Bilateral 2006  . GLAUCOMA SURGERY Bilateral   . PERINEOPLASTY     and rectocele  . Pulmonary Function Test  August 2013   CPET-PFTs: FVC 58%, FEV1 48%; breathing reserve less than 10%, normal DLCO; reduced vital capacity -- suggesting obstructive lung disease  . SHOULDER ARTHROSCOPY W/ ROTATOR CUFF REPAIR  7/04, 2003; ?date   right, "left side was done a 2nd time"  . TEE WITHOUT CARDIOVERSION N/A 08/29/2016   Procedure: TRANSESOPHAGEAL ECHOCARDIOGRAM (TEE);  Surgeon: Sanda Klein, MD;  Location: [facility];  Service: Cardiovascular;  Laterality: N/A;  . TONSILLECTOMY    . TOTAL ABDOMINAL HYSTERECTOMY    . TRANSTHORACIC ECHOCARDIOGRAM  February 2011   EF 55-60%. Grade 1 diastolic dysfunction/relaxation abnormality. Mild MR    Current Outpatient Prescriptions  Medication Sig Dispense Refill  . acetaminophen (TYLENOL) 325 MG tablet  Take 650 mg by mouth every 6 (six) hours as needed for mild pain.     Marland Kitchen amiodarone (PACERONE) 200 MG tablet Take 1 tablet (200 mg total) by mouth daily. 30 tablet 0  . apixaban (ELIQUIS) 2.5 MG TABS tablet Take 1 tablet (2.5 mg total) by mouth 2 (two) times daily. 60 tablet 0  . budesonide-formoterol (SYMBICORT) 160-4.5 MCG/ACT inhaler Inhale 2 puffs into the lungs 2 (two) times daily. Rinse mouth (Patient taking differently: Inhale 1 puff into the lungs 2 (two) times daily. Rinse mouth)  10.2 g 11  . Cholecalciferol (VITAMIN D3) 5000 units TABS Take 2 tablets by mouth daily.    . Coenzyme Q10 (COQ10) 200 MG CAPS Take 1 capsule by mouth daily.     . cyanocobalamin 1000 MCG tablet Take 2,000 mcg by mouth daily.    Marland Kitchen diltiazem (CARDIZEM SR) 60 MG 12 hr capsule Take 60 mg by mouth daily.     . dorzolamide-timolol (COSOPT) 22.3-6.8 MG/ML ophthalmic solution Place 1 drop into both eyes 2 (two) times daily.      . fluocinonide (LIDEX) 0.05 % external solution Apply 1 application topically once a week. scalp  1  . ipratropium (ATROVENT) 0.03 % nasal spray 1-2 puffs each nostril every 8 hours if needed 30 mL 12  . loratadine (CLARITIN) 10 MG tablet Take 10 mg by mouth daily as needed for allergies.     . Multiple Vitamins-Minerals (PRESERVISION/LUTEIN) CAPS Take 1 capsule by mouth 2 (two) times daily.      Marland Kitchen PROAIR HFA 108 (90 Base) MCG/ACT inhaler inhale 2 puffs by mouth every 6 hours if needed for wheezing or shortness of breath  0  . hyaluronate sodium (RADIAPLEXRX) GEL Apply 1 application topically 2 (two) times daily.    . OXYGEN 2LPM      No current facility-administered medications for this encounter.     Allergies  Allergen Reactions  . Cefaclor     REACTION: gi upset  . Codeine Nausea And Vomiting    Social History   Social History  . Marital status: Married    Spouse name: N/A  . Number of children: N/A  . Years of education: N/A   Occupational History  . pediatrician     retired   Social History Main Topics  . Smoking status: Former Smoker    Packs/day: 0.75    Years: 25.00    Types: Cigarettes    Quit date: 1969  . Smokeless tobacco: Never Used     Comment: 3/4 ppd 1946-1969 (17.5 pack years)  . Alcohol use 4.2 oz/week    3 Glasses of wine, 4 Shots of liquor per week     Comment: 10/03/2016 "high ball or glass of wine prior to dinner"  . Drug use: No  . Sexual activity: No   Other Topics Concern  . Not on file   Social History Narrative   She is  here with her husband - a retired Lexicographer.    They have 2 children, 3 grandchildren.    She does not smoke (never smoked), and does not drink.    She does not really get a lot of exercise besides what I mentioned. She does walk with   her husband but no additional exercise beyond that, but which is still relatively adequate.     Family History  Problem Relation Age of Onset  . Osteoporosis Mother   . Diabetes Paternal Aunt     ROS- All systems are reviewed  and negative except as per the HPI above  Physical Exam: Vitals:   01/24/17 1135  BP: (!) 146/70  Pulse: 76  Weight: 126 lb (57.2 kg)  Height: 5' 2.75" (1.594 m)   Wt Readings from Last 3 Encounters:  01/24/17 126 lb (57.2 kg)  12/25/16 124 lb 3.2 oz (56.3 kg)  12/06/16 118 lb (53.5 kg)    Labs: Lab Results  Component Value Date   NA 138 10/04/2016   K 4.4 10/04/2016   CL 103 10/04/2016   CO2 25 10/04/2016   GLUCOSE 92 10/04/2016   BUN 7 10/04/2016   CREATININE 0.65 10/04/2016   CALCIUM 8.7 (L) 10/04/2016   PHOS 2.8 08/20/2016   MG 1.9 08/23/2016   Lab Results  Component Value Date   INR 1.02 10/03/2016   No results found for: CHOL, HDL, LDLCALC, TRIG   GEN- The patient is well appearing, alert and oriented x 3 today.   Head- normocephalic, atraumatic Eyes-  Sclera clear, conjunctiva pink Ears- hearing intact Oropharynx- clear Neck- supple, no JVP Lymph- no cervical lymphadenopathy Lungs- Clear to ausculation bilaterally, normal work of breathing Heart- Regular rate and rhythm, no murmurs, rubs or gallops, PMI not laterally displaced GI- soft, NT, ND, + BS Extremities- no clubbing, cyanosis, or edema MS- no significant deformity or atrophy Skin- no rash or lesion Psych- euthymic mood, full affect Neuro- strength and sensation are intact  EKG- NSR at 76 bpm, IRBBB pr int 174 ms, qrs int 92 ms, qtc 492 ms Epic records reviewed from hospitalization 07/2016 to 08/2016 [facility] records  reviewed  Labs reviewed-  01/11/17- cmet with normal liver enzymes, tsh wnl cbc      Assessment and Plan: 1. Afib On amiodarone sine January in the setting of URI and newly dx lung CA Very hard to control v rate at the time complicated by hypotension, HF symptoms and placed on amiodarone with successful cardioversion Today, husband asked if dose could be reduced So far, pt is tolerating well with no significant side effects I am afraid if dose is reduced to 100 mg a day, she may go back into afib and quickly decompensate For now, I would continue dose at 200 mg a day but over the next 3-6 months if lung status is stable and no afib, then consideration to lower the dose to 100 mg daily  F/u in 3 months and if lung status is stable consideration for PFT's to follow amiodarone   Jodi Burns, [facility] [address] [place],  [address] [phone]

## 2017-01-26 NOTE — Anesthesia Postprocedure Evaluation (Signed)
Anesthesia Post Note  Patient: Jodi Burns  Procedure(s) Performed: Procedure(s) (LRB): TRANSESOPHAGEAL ECHOCARDIOGRAM (TEE) (N/A) CARDIOVERSION (N/A)     Anesthesia Post Evaluation  Last Vitals:  Vitals:   08/31/16 1201 08/31/16 1254  BP: 136/70 (!) 139/56  Pulse:  90  Resp:    Temp:  36.9 C    Last Pain:  Vitals:   08/31/16 1254  TempSrc: Oral  PainSc:                  Effie Berkshire

## 2017-01-26 NOTE — Addendum Note (Signed)
Addendum  created 01/26/17 0981 by Effie Berkshire, MD   Sign clinical note

## 2017-02-05 ENCOUNTER — Encounter (HOSPITAL_COMMUNITY): Payer: Self-pay

## 2017-02-05 ENCOUNTER — Ambulatory Visit (HOSPITAL_COMMUNITY)
Admission: RE | Admit: 2017-02-05 | Discharge: 2017-02-05 | Disposition: A | Discharge status: Home / Self Care | Payer: Medicare Other | Source: Ambulatory Visit | Attending: Radiation Oncology | Admitting: Radiation Oncology

## 2017-02-05 DIAGNOSIS — J90 Pleural effusion, not elsewhere classified: Secondary | ICD-10-CM | POA: Insufficient documentation

## 2017-02-05 DIAGNOSIS — R918 Other nonspecific abnormal finding of lung field: Secondary | ICD-10-CM | POA: Insufficient documentation

## 2017-02-05 DIAGNOSIS — J432 Centrilobular emphysema: Secondary | ICD-10-CM | POA: Diagnosis not present

## 2017-02-05 DIAGNOSIS — C3411 Malignant neoplasm of upper lobe, right bronchus or lung: Secondary | ICD-10-CM | POA: Insufficient documentation

## 2017-02-05 DIAGNOSIS — I2510 Atherosclerotic heart disease of native coronary artery without angina pectoris: Secondary | ICD-10-CM | POA: Diagnosis not present

## 2017-02-05 DIAGNOSIS — I700 Atherosclerosis of aorta: Secondary | ICD-10-CM | POA: Insufficient documentation

## 2017-02-05 DIAGNOSIS — R911 Solitary pulmonary nodule: Secondary | ICD-10-CM | POA: Diagnosis not present

## 2017-02-08 ENCOUNTER — Other Ambulatory Visit: Payer: Self-pay | Admitting: Radiation Oncology

## 2017-02-08 DIAGNOSIS — C3411 Malignant neoplasm of upper lobe, right bronchus or lung: Secondary | ICD-10-CM

## 2017-03-02 DIAGNOSIS — I480 Paroxysmal atrial fibrillation: Secondary | ICD-10-CM | POA: Diagnosis not present

## 2017-03-02 DIAGNOSIS — R0602 Shortness of breath: Secondary | ICD-10-CM | POA: Diagnosis not present

## 2017-03-02 DIAGNOSIS — Z6823 Body mass index (BMI) 23.0-23.9, adult: Secondary | ICD-10-CM | POA: Diagnosis not present

## 2017-03-02 DIAGNOSIS — Z1389 Encounter for screening for other disorder: Secondary | ICD-10-CM | POA: Diagnosis not present

## 2017-03-02 DIAGNOSIS — C3490 Malignant neoplasm of unspecified part of unspecified bronchus or lung: Secondary | ICD-10-CM | POA: Diagnosis not present

## 2017-03-05 DIAGNOSIS — Z6823 Body mass index (BMI) 23.0-23.9, adult: Secondary | ICD-10-CM | POA: Diagnosis not present

## 2017-03-05 DIAGNOSIS — R0609 Other forms of dyspnea: Secondary | ICD-10-CM | POA: Diagnosis not present

## 2017-03-05 DIAGNOSIS — J189 Pneumonia, unspecified organism: Secondary | ICD-10-CM | POA: Diagnosis not present

## 2017-03-05 DIAGNOSIS — R0602 Shortness of breath: Secondary | ICD-10-CM | POA: Diagnosis not present

## 2017-03-05 DIAGNOSIS — C3490 Malignant neoplasm of unspecified part of unspecified bronchus or lung: Secondary | ICD-10-CM | POA: Diagnosis not present

## 2017-03-13 ENCOUNTER — Other Ambulatory Visit: Payer: Self-pay | Admitting: Internal Medicine

## 2017-03-13 DIAGNOSIS — M4854XA Collapsed vertebra, not elsewhere classified, thoracic region, initial encounter for fracture: Secondary | ICD-10-CM

## 2017-03-13 DIAGNOSIS — W010XXA Fall on same level from slipping, tripping and stumbling without subsequent striking against object, initial encounter: Secondary | ICD-10-CM | POA: Diagnosis not present

## 2017-03-13 DIAGNOSIS — M8580 Other specified disorders of bone density and structure, unspecified site: Secondary | ICD-10-CM | POA: Diagnosis not present

## 2017-03-13 DIAGNOSIS — M4857XA Collapsed vertebra, not elsewhere classified, lumbosacral region, initial encounter for fracture: Secondary | ICD-10-CM | POA: Diagnosis not present

## 2017-03-13 DIAGNOSIS — M545 Low back pain: Secondary | ICD-10-CM | POA: Diagnosis not present

## 2017-03-22 ENCOUNTER — Other Ambulatory Visit: Payer: Medicare Other

## 2017-03-28 ENCOUNTER — Encounter (HOSPITAL_COMMUNITY): Payer: Self-pay | Admitting: Nurse Practitioner

## 2017-03-28 ENCOUNTER — Ambulatory Visit (HOSPITAL_COMMUNITY)
Admission: RE | Admit: 2017-03-28 | Discharge: 2017-03-28 | Disposition: A | Discharge status: Home / Self Care | Payer: Medicare Other | Source: Ambulatory Visit | Attending: Nurse Practitioner | Admitting: Nurse Practitioner

## 2017-03-28 VITALS — BP 144/86 | HR 88 | Ht 62.75 in

## 2017-03-28 DIAGNOSIS — Z87891 Personal history of nicotine dependence: Secondary | ICD-10-CM | POA: Diagnosis not present

## 2017-03-28 DIAGNOSIS — E785 Hyperlipidemia, unspecified: Secondary | ICD-10-CM | POA: Diagnosis not present

## 2017-03-28 DIAGNOSIS — Z885 Allergy status to narcotic agent status: Secondary | ICD-10-CM | POA: Insufficient documentation

## 2017-03-28 DIAGNOSIS — H548 Legal blindness, as defined in USA: Secondary | ICD-10-CM | POA: Insufficient documentation

## 2017-03-28 DIAGNOSIS — Z8262 Family history of osteoporosis: Secondary | ICD-10-CM | POA: Diagnosis not present

## 2017-03-28 DIAGNOSIS — M545 Low back pain: Secondary | ICD-10-CM | POA: Diagnosis not present

## 2017-03-28 DIAGNOSIS — I4891 Unspecified atrial fibrillation: Secondary | ICD-10-CM | POA: Diagnosis not present

## 2017-03-28 DIAGNOSIS — I959 Hypotension, unspecified: Secondary | ICD-10-CM | POA: Insufficient documentation

## 2017-03-28 DIAGNOSIS — Z86718 Personal history of other venous thrombosis and embolism: Secondary | ICD-10-CM | POA: Diagnosis not present

## 2017-03-28 DIAGNOSIS — Z881 Allergy status to other antibiotic agents status: Secondary | ICD-10-CM | POA: Diagnosis not present

## 2017-03-28 DIAGNOSIS — Z7901 Long term (current) use of anticoagulants: Secondary | ICD-10-CM | POA: Insufficient documentation

## 2017-03-28 DIAGNOSIS — G8929 Other chronic pain: Secondary | ICD-10-CM | POA: Insufficient documentation

## 2017-03-28 DIAGNOSIS — C3490 Malignant neoplasm of unspecified part of unspecified bronchus or lung: Secondary | ICD-10-CM | POA: Insufficient documentation

## 2017-03-28 DIAGNOSIS — M8580 Other specified disorders of bone density and structure, unspecified site: Secondary | ICD-10-CM | POA: Insufficient documentation

## 2017-03-28 DIAGNOSIS — Z7951 Long term (current) use of inhaled steroids: Secondary | ICD-10-CM | POA: Insufficient documentation

## 2017-03-28 DIAGNOSIS — E559 Vitamin D deficiency, unspecified: Secondary | ICD-10-CM | POA: Insufficient documentation

## 2017-03-28 DIAGNOSIS — J45909 Unspecified asthma, uncomplicated: Secondary | ICD-10-CM | POA: Diagnosis not present

## 2017-03-28 DIAGNOSIS — Z923 Personal history of irradiation: Secondary | ICD-10-CM | POA: Diagnosis not present

## 2017-03-28 DIAGNOSIS — Z79899 Other long term (current) drug therapy: Secondary | ICD-10-CM | POA: Diagnosis not present

## 2017-03-28 MED ORDER — AMIODARONE HCL 200 MG PO TABS: 100.0000 mg | ORAL_TABLET | Freq: Every day | ORAL | 6 refills | Status: DC

## 2017-03-28 NOTE — Patient Instructions (Signed)
Your physician has recommended you make the following change in your medication:  1)Decrease Amiodarone to 100mg  a day ( 1/2 tablet of the 200mg  tablet)  Follow up with Dr. Curt Bears in 1 month -- scheduler will be in touch with you

## 2017-03-28 NOTE — Progress Notes (Signed)
 Primary Care Physician: Perini, Mark, MD Referring Physician: Dr. Perini EP: Dr. Camnitz    Jodi Burns is a 83 y.o. female with a h/o hospitalization in January in the setting of a URI and afib with RVR. She was very unstable in AFib with hypotension which made it hard to rate control her and with associated acute on chronic heart failure. She had urgent cardioversion and then was loaded on amiodarone. At that time pt was also found to have lung cancer for which she has undergone radiation. She is in the afib clinic for evaluation on ongoing amiodarone use. Pt has been staying in SR and does not appear to have any side effects from amiodarone so far. She is pending further evaluation of effect of radiation on her lung mass. Most of the history is from her husband who is a retired pediatrician from this area.  F/u in afib clinic 8/1. On last visit, pt's husband brought up the possibility to decrease amiodarone to 100 mg a day to offset potential side effects. A that time, I suggested a little more time on 200 mg daily. In the office today, they are unaware of any irregular heart beat so could try this now. He reports that she fell a few weeks ago and had xray of chest with PCP that showed a T12 compression fracture.  A MRI is pending for next week but husband asked us to move up to this Sunday. She has c/o  of more discomfort in the left posterior chest area. She is tender on palpation everywhere on her back that is palpated.Continues on apixaban 2.5 mg bid  Today, she denies symptoms of palpitations, chest pain, shortness of breath, orthopnea, PND, lower extremity edema, dizziness, presyncope, syncope, or neurologic sequela.  Positive for fatigue.The patient is tolerating medications without difficulties and is otherwise without complaint today.   Past Medical History:  Diagnosis Date  . Allergic rhinitis   . Arthritis    "the normal for my age" (10/03/2016)  . Asthma    "very mild"  (10/03/2016)  . Atrial fibrillation (HCC)   . Chronic lower back pain   . Dyspnea on exertion   . History of blood transfusion    "I was young; don't remember why I needed it"  . History of external beam radiation therapy 10/31/16-11/13/16   left upper lung treated to 54 Gy in 3 fractions, right upper lung to 50 Gy in 10 fractions  . 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 On home oxygen therapy    "available; not needed it yet" (10/03/2016)  . Osteopenia   . Other and unspecified hyperlipidemia   . Other retinal disorders(362.89)   . Personal history of other diseases of circulatory system   . Personal history of venous thrombosis and embolism 4/94  . Reactive airway disease   . Subjective visual disturbance, unspecified   . Vitamin D deficiency    Past Surgical History:  Procedure Laterality Date  . APPENDECTOMY    . BREAST BIOPSY Bilateral   . CARDIOVASCULAR STRESS TEST  August 2013   CPET-MET: Submaximal effort, did not reach anaerobic threshold --> peak VO2 76% (not overly concerning for CAD)  . CARDIOVERSION N/A 08/29/2016   Procedure: CARDIOVERSION;  Surgeon: Mihai Croitoru, MD;  Location: [facility];  Service: Cardiovascular;  Laterality: N/A;  . CARPAL TUNNEL RELEASE Bilateral 2006  . GLAUCOMA SURGERY Bilateral   . PERINEOPLASTY       and rectocele  . Pulmonary Function Test  August 2013   CPET-PFTs: FVC 58%, FEV1 48%; breathing reserve less than 10%, normal DLCO; reduced vital capacity -- suggesting obstructive lung disease  . SHOULDER ARTHROSCOPY W/ ROTATOR CUFF REPAIR  7/04, 2003; ?date   right, "left side was done a 2nd time"  . TEE WITHOUT CARDIOVERSION N/A 08/29/2016   Procedure: TRANSESOPHAGEAL ECHOCARDIOGRAM (TEE);  Surgeon: Mihai Croitoru, MD;  Location: [facility];  Service: Cardiovascular;  Laterality: N/A;  . TONSILLECTOMY    . TOTAL ABDOMINAL HYSTERECTOMY    . TRANSTHORACIC ECHOCARDIOGRAM  February 2011     EF 55-60%. Grade 1 diastolic dysfunction/relaxation abnormality. Mild MR    Current Outpatient Prescriptions  Medication Sig Dispense Refill  . acetaminophen (TYLENOL) 325 MG tablet Take 650 mg by mouth every 6 (six) hours as needed for mild pain.     . amiodarone (PACERONE) 200 MG tablet Take 0.5 tablets (100 mg total) by mouth daily. 15 tablet 6  . apixaban (ELIQUIS) 2.5 MG TABS tablet Take 1 tablet (2.5 mg total) by mouth 2 (two) times daily. 60 tablet 0  . budesonide-formoterol (SYMBICORT) 160-4.5 MCG/ACT inhaler Inhale 2 puffs into the lungs 2 (two) times daily. Rinse mouth (Patient taking differently: Inhale 1 puff into the lungs 2 (two) times daily. Rinse mouth) 10.2 g 11  . Cholecalciferol (VITAMIN D3) 5000 units TABS Take 2 tablets by mouth daily.    . Coenzyme Q10 (COQ10) 200 MG CAPS Take 1 capsule by mouth daily.     . cyanocobalamin 1000 MCG tablet Take 2,000 mcg by mouth daily.    . diltiazem (CARDIZEM SR) 60 MG 12 hr capsule Take 60 mg by mouth daily.     . dorzolamide-timolol (COSOPT) 22.3-6.8 MG/ML ophthalmic solution Place 1 drop into both eyes 2 (two) times daily.      . fluocinonide (LIDEX) 0.05 % external solution Apply 1 application topically once a week. scalp  1  . ipratropium (ATROVENT) 0.03 % nasal spray 1-2 puffs each nostril every 8 hours if needed 30 mL 12  . loratadine (CLARITIN) 10 MG tablet Take 10 mg by mouth daily as needed for allergies.     . Multiple Vitamins-Minerals (PRESERVISION/LUTEIN) CAPS Take 1 capsule by mouth 2 (two) times daily.      . OXYGEN 2LPM     . PROAIR HFA 108 (90 Base) MCG/ACT inhaler inhale 2 puffs by mouth every 6 hours if needed for wheezing or shortness of breath  0  . hyaluronate sodium (RADIAPLEXRX) GEL Apply 1 application topically 2 (two) times daily.     No current facility-administered medications for this encounter.     Allergies  Allergen Reactions  . Cefaclor     REACTION: gi upset  . Codeine Nausea And Vomiting     Social History   Social History  . Marital status: Married    Spouse name: N/A  . Number of children: N/A  . Years of education: N/A   Occupational History  . pediatrician     retired   Social History Main Topics  . Smoking status: Former Smoker    Packs/day: 0.75    Years: 25.00    Types: Cigarettes    Quit date: 1969  . Smokeless tobacco: Never Used     Comment: 3/4 ppd 1946-1969 (17.5 pack years)  . Alcohol use 4.2 oz/week    3 Glasses of wine, 4 Shots of liquor per week     Comment: 10/03/2016 "high ball or glass   of wine prior to dinner"  . Drug use: No  . Sexual activity: No   Other Topics Concern  . Not on file   Social History Narrative   She is here with her husband - a retired pediatrician.    They have 2 children, 3 grandchildren.    She does not smoke (never smoked), and does not drink.    She does not really get a lot of exercise besides what I mentioned. She does walk with   her husband but no additional exercise beyond that, but which is still relatively adequate.     Family History  Problem Relation Age of Onset  . Osteoporosis Mother   . Diabetes Paternal Aunt     ROS- All systems are reviewed and negative except as per the HPI above  Physical Exam: Vitals:   03/28/17 1106  BP: (!) 144/86  Pulse: 88  Height: 5' 2.75" (1.594 m)   Wt Readings from Last 3 Encounters:  01/24/17 126 lb (57.2 kg)  12/25/16 124 lb 3.2 oz (56.3 kg)  12/06/16 118 lb (53.5 kg)    Labs: Lab Results  Component Value Date   NA 138 10/04/2016   K 4.4 10/04/2016   CL 103 10/04/2016   CO2 25 10/04/2016   GLUCOSE 92 10/04/2016   BUN 7 10/04/2016   CREATININE 0.65 10/04/2016   CALCIUM 8.7 (L) 10/04/2016   PHOS 2.8 08/20/2016   MG 1.9 08/23/2016   Lab Results  Component Value Date   INR 1.02 10/03/2016   No results found for: CHOL, HDL, LDLCALC, TRIG   GEN- The patient is well appearing, alert and oriented x 3 today.   Head- normocephalic,  atraumatic Eyes-  Sclera clear, conjunctiva pink Ears- hearing intact Oropharynx- clear Neck- supple, no JVP Lymph- no cervical lymphadenopathy Lungs- Clear to ausculation bilaterally, normal work of breathing Heart- Regular rate and rhythm, no murmurs, rubs or gallops, PMI not laterally displaced GI- soft, NT, ND, + BS Extremities- no clubbing, cyanosis, or edema MS- no significant deformity or atrophy Skin- no rash or lesion Psych- euthymic mood, full affect Neuro- strength and sensation are intact  EKG- NSR at 88 bpm, LAD, IRBBB, Pr int 178 ms, qrs int 94 ms, qtc 488 ms Epic records reviewed from hospitalization 07/2016 to 08/2016 [facility] medical records reviewed  Labs reviewed-  01/11/17- cmet with normal liver enzymes, tsh wnl cbc      Assessment and Plan: 1. Afib On amiodarone sine January in the setting of URI and newly dx lung CA Very hard to control v rate at the time complicated by hypotension, HF symptoms and placed on amiodarone with successful cardioversion Will decrease amiodarone to 100 mg a day with no afib appreciated and to off set potential side effects So far, pt is tolerating well with no significant side effects Mentioned PFT's but with back pain,  may not be able to give good effort Consider on f/u visit, as well as cmet, tsh if not drawn with PCP MRI pending for recent T12 fx this Sunday ordered by PCP  F/u with Dr. Camnitz in one month      Donna C. Carroll, ANP-C [facility] [address] [place], [place] [address] [phone]  

## 2017-04-01 ENCOUNTER — Ambulatory Visit
Admission: RE | Admit: 2017-04-01 | Discharge: 2017-04-01 | Disposition: A | Discharge status: Home / Self Care | Payer: Medicare Other | Source: Ambulatory Visit | Attending: Internal Medicine | Admitting: Internal Medicine

## 2017-04-01 DIAGNOSIS — M4854XA Collapsed vertebra, not elsewhere classified, thoracic region, initial encounter for fracture: Secondary | ICD-10-CM

## 2017-04-05 ENCOUNTER — Other Ambulatory Visit: Payer: Medicare Other

## 2017-04-11 ENCOUNTER — Encounter: Payer: Self-pay | Admitting: Cardiology

## 2017-04-17 DIAGNOSIS — M6281 Muscle weakness (generalized): Secondary | ICD-10-CM | POA: Diagnosis not present

## 2017-04-17 DIAGNOSIS — R2681 Unsteadiness on feet: Secondary | ICD-10-CM | POA: Diagnosis not present

## 2017-04-17 DIAGNOSIS — R278 Other lack of coordination: Secondary | ICD-10-CM | POA: Diagnosis not present

## 2017-04-20 DIAGNOSIS — R278 Other lack of coordination: Secondary | ICD-10-CM | POA: Diagnosis not present

## 2017-04-20 DIAGNOSIS — M6281 Muscle weakness (generalized): Secondary | ICD-10-CM | POA: Diagnosis not present

## 2017-04-20 DIAGNOSIS — R2681 Unsteadiness on feet: Secondary | ICD-10-CM | POA: Diagnosis not present

## 2017-04-23 DIAGNOSIS — R2681 Unsteadiness on feet: Secondary | ICD-10-CM | POA: Diagnosis not present

## 2017-04-23 DIAGNOSIS — M6281 Muscle weakness (generalized): Secondary | ICD-10-CM | POA: Diagnosis not present

## 2017-04-23 DIAGNOSIS — R278 Other lack of coordination: Secondary | ICD-10-CM | POA: Diagnosis not present

## 2017-04-25 DIAGNOSIS — M6281 Muscle weakness (generalized): Secondary | ICD-10-CM | POA: Diagnosis not present

## 2017-04-25 DIAGNOSIS — R2681 Unsteadiness on feet: Secondary | ICD-10-CM | POA: Diagnosis not present

## 2017-04-25 DIAGNOSIS — R278 Other lack of coordination: Secondary | ICD-10-CM | POA: Diagnosis not present

## 2017-05-02 ENCOUNTER — Encounter: Payer: Self-pay | Admitting: Cardiology

## 2017-05-02 ENCOUNTER — Institutional Professional Consult (permissible substitution): Payer: Medicare Other | Admitting: Internal Medicine

## 2017-05-02 ENCOUNTER — Telehealth: Payer: Self-pay | Admitting: *Deleted

## 2017-05-02 ENCOUNTER — Ambulatory Visit (INDEPENDENT_AMBULATORY_CARE_PROVIDER_SITE_OTHER): Payer: Medicare Other | Admitting: Cardiology

## 2017-05-02 VITALS — BP 146/72 | HR 88 | Ht 62.5 in | Wt 130.0 lb

## 2017-05-02 DIAGNOSIS — I481 Persistent atrial fibrillation: Secondary | ICD-10-CM

## 2017-05-02 DIAGNOSIS — E785 Hyperlipidemia, unspecified: Secondary | ICD-10-CM

## 2017-05-02 DIAGNOSIS — I4819 Other persistent atrial fibrillation: Secondary | ICD-10-CM

## 2017-05-02 DIAGNOSIS — I10 Essential (primary) hypertension: Secondary | ICD-10-CM | POA: Diagnosis not present

## 2017-05-02 NOTE — Progress Notes (Signed)
Electrophysiology Office Note   Date:  05/02/2017   ID:  Jodi Burns, DOB 08-02-1926, MRN [id number]  PCP:  Crist Infante, MD  Cardiologist:  Primary Electrophysiologist:  Mikalah Skyles Meredith Leeds, MD    Chief Complaint  Patient presents with  . Advice Only    PAF/discuss Afib ablation     History of Present Illness: Jodi Burns is a 83 y.o. female who is being seen today for the evaluation of atrial fibrillation at the request of Crist Infante, MD. Presenting today for electrophysiology evaluation. She was hospitalized in January with atrial fibrillation and a URI. Her atrial fibrillation was complicated by hypotension this making rate control lites difficult. She had an urgent cardioversion and was loaded on amiodarone. She was found to have lung cancer and has since undergone radiation therapy.    Today, she denies symptoms of palpitations, chest pain, shortness of breath, orthopnea, PND, lower extremity edema, claudication, dizziness, presyncope, syncope, bleeding, or neurologic sequela. The patient is tolerating medications without difficulties. Her main complaint today is of fatigue. She has been working with PT, but is unable to do all of the daily activities that she would like. She is currently awaiting a PET scan for her lung adenocarcinoma. He finished radiation therapy a few months ago.   Past Medical History:  Diagnosis Date  . Allergic rhinitis   . Arthritis    "the normal for my age" (10/03/2016)  . Asthma    "very mild" (10/03/2016)  . Atrial fibrillation ([place])   . Chronic lower back pain   . Dyspnea on exertion   . History of blood transfusion    "I was young; don't remember why I needed it"  . History of external beam radiation therapy 10/31/16-11/13/16   left upper lung treated to 54 Gy in 3 fractions, right upper lung to 50 Gy in 10 fractions  . 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 On home  oxygen therapy    "available; not needed it yet" (10/03/2016)  . Osteopenia   . Other and unspecified hyperlipidemia   . Other retinal disorders(362.89)   . Personal history of other diseases of circulatory system   . Personal history of venous thrombosis and embolism 4/94  . Reactive airway disease   . Subjective visual disturbance, unspecified   . Vitamin D deficiency    Past Surgical History:  Procedure Laterality Date  . APPENDECTOMY    . BREAST BIOPSY Bilateral   . CARDIOVASCULAR STRESS TEST  August 2013   CPET-MET: Submaximal effort, did not reach anaerobic threshold --> peak VO2 76% (not overly concerning for CAD)  . CARDIOVERSION N/A 08/29/2016   Procedure: CARDIOVERSION;  Surgeon: Sanda Klein, MD;  Location: [facility];  Service: Cardiovascular;  Laterality: N/A;  . CARPAL TUNNEL RELEASE Bilateral 2006  . GLAUCOMA SURGERY Bilateral   . PERINEOPLASTY     and rectocele  . Pulmonary Function Test  August 2013   CPET-PFTs: FVC 58%, FEV1 48%; breathing reserve less than 10%, normal DLCO; reduced vital capacity -- suggesting obstructive lung disease  . SHOULDER ARTHROSCOPY W/ ROTATOR CUFF REPAIR  7/04, 2003; ?date   right, "left side was done a 2nd time"  . TEE WITHOUT CARDIOVERSION N/A 08/29/2016   Procedure: TRANSESOPHAGEAL ECHOCARDIOGRAM (TEE);  Surgeon: Sanda Klein, MD;  Location: [facility];  Service: Cardiovascular;  Laterality: N/A;  . TONSILLECTOMY    . TOTAL ABDOMINAL HYSTERECTOMY    .  TRANSTHORACIC ECHOCARDIOGRAM  February 2011   EF 55-60%. Grade 1 diastolic dysfunction/relaxation abnormality. Mild MR     Current Outpatient Prescriptions  Medication Sig Dispense Refill  . acetaminophen (TYLENOL) 325 MG tablet Take 650 mg by mouth every 6 (six) hours as needed for mild pain.     Marland Kitchen amiodarone (PACERONE) 200 MG tablet Take 0.5 tablets (100 mg total) by mouth daily. 15 tablet 6  . apixaban (ELIQUIS) 2.5 MG TABS tablet Take 1 tablet (2.5 mg total) by mouth 2 (two)  times daily. 60 tablet 0  . budesonide-formoterol (SYMBICORT) 160-4.5 MCG/ACT inhaler Inhale 2 puffs into the lungs 2 (two) times daily. Rinse mouth (Patient taking differently: Inhale 1 puff into the lungs 2 (two) times daily. Rinse mouth) 10.2 g 11  . Cholecalciferol (VITAMIN D3) 5000 units TABS Take 2 tablets by mouth daily.    . Coenzyme Q10 (COQ10) 200 MG CAPS Take 1 capsule by mouth daily.     . cyanocobalamin 1000 MCG tablet Take 2,000 mcg by mouth daily.    Marland Kitchen diltiazem (CARDIZEM SR) 60 MG 12 hr capsule Take 60 mg by mouth daily.     . dorzolamide-timolol (COSOPT) 22.3-6.8 MG/ML ophthalmic solution Place 1 drop into both eyes 2 (two) times daily.      . fluocinonide (LIDEX) 0.05 % external solution Apply 1 application topically once a week. scalp  1  . hyaluronate sodium (RADIAPLEXRX) GEL Apply 1 application topically 2 (two) times daily.    Marland Kitchen ipratropium (ATROVENT) 0.03 % nasal spray 1-2 puffs each nostril every 8 hours if needed 30 mL 12  . loratadine (CLARITIN) 10 MG tablet Take 10 mg by mouth daily as needed for allergies.     . Multiple Vitamins-Minerals (PRESERVISION/LUTEIN) CAPS Take 1 capsule by mouth 2 (two) times daily.      . OXYGEN 2LPM     . PROAIR HFA 108 (90 Base) MCG/ACT inhaler inhale 2 puffs by mouth every 6 hours if needed for wheezing or shortness of breath  0   No current facility-administered medications for this visit.     Allergies:   Cefaclor and Codeine   Social History:  The patient  reports that she quit smoking about 49 years ago. Her smoking use included Cigarettes. She has a 18.75 pack-year smoking history. She has never used smokeless tobacco. She reports that she drinks about 4.2 oz of alcohol per week . She reports that she does not use drugs.   Family History:  The patient's family history includes Diabetes in her paternal aunt; Osteoporosis in her mother.    ROS:  Please see the history of present illness.   Otherwise, review of systems is positive  for none.   All other systems are reviewed and negative.    PHYSICAL EXAM: VS:  BP (!) 146/72   Pulse 88   Ht 5' 2.5" (1.588 m)   Wt 130 lb (59 kg)   LMP 08/29/1967   SpO2 96%   BMI 23.40 kg/m  , BMI Body mass index is 23.4 kg/m. GEN: Well nourished, well developed, in no acute distress  HEENT: normal  Neck: no JVD, carotid bruits, or masses Cardiac: RRR; no murmurs, rubs, or gallops,no edema  Respiratory:  clear to auscultation bilaterally, normal work of breathing GI: soft, nontender, nondistended, + BS MS: no deformity or atrophy  Skin: warm and dry Neuro:  Burns and sensation are intact Psych: euthymic mood, full affect  EKG:  EKG is not ordered today. Personal review  of the ekg ordered 03/28/17 shows SR, iRBBB, LAFB, rate 88  Recent Labs: 08/20/2016: B Natriuretic Peptide 201.5; TSH 1.970 08/23/2016: Magnesium 1.9 10/04/2016: BUN 7; Creatinine, Ser 0.65; Hemoglobin 12.4; Platelets 228; Potassium 4.4; Sodium 138    Lipid Panel  No results found for: CHOL, TRIG, HDL, CHOLHDL, VLDL, LDLCALC, LDLDIRECT   Wt Readings from Last 3 Encounters:  05/02/17 130 lb (59 kg)  01/24/17 126 lb (57.2 kg)  12/25/16 124 lb 3.2 oz (56.3 kg)      Other studies Reviewed: Additional studies/ records that were reviewed today include: TEE 08/29/16  Review of the above records today demonstrates:  - Left ventricle: The cavity size was normal. Wall thickness was   normal. Systolic function was normal. The estimated ejection   fraction was in the range of 60% to 65%. Wall motion was normal;   there were no regional wall motion abnormalities. No evidence of   thrombus. - Aortic valve: No evidence of vegetation. No evidence of   vegetation. - Mitral valve: No evidence of vegetation. - Left atrium: No evidence of thrombus in the atrial cavity or   appendage. - Right atrium: No evidence of thrombus in the atrial cavity or   appendage. - Atrial septum: No defect or patent foramen ovale was  identified. - Tricuspid valve: Mild prolapse. No evidence of vegetation. There   was mild-moderate regurgitation directed centrally. - Pulmonic valve: No evidence of vegetation.   ASSESSMENT AND PLAN:  1.  Persistent atrial fibrillation: on eliquis and amiodarone. Amiodarone decreased to 100 mg at most recent AF clinic visit.  Remains in sinus rhythm today. No changes at this time.  This patients CHA2DS2-VASc Score and unadjusted Ischemic Stroke Rate (% per year) is equal to 4.8 % stroke rate/year from a score of 4  Above score calculated as 1 point each if present [CHF, HTN, DM, Vascular=MI/PAD/Aortic Plaque, Age if 65-74, or Female] Above score calculated as 2 points each if present [Age > 75, or Stroke/TIA/TE]   2. Hyperlipidemia: Has recently been taken off the simvastatin by her primary physician  3. Hypertension: Currently not on medications. Blood pressure is high normal. No changes.  Current medicines are reviewed at length with the patient today.   The patient does not have concerns regarding her medicines.  The following changes were made today:  none  Labs/ tests ordered today include:  No orders of the defined types were placed in this encounter.    Disposition:   FU with Ida Uppal 6 months  Signed, Deasha Clendenin Meredith Leeds, MD  05/02/2017 11:42 AM     [facility] [address] [place] [address] [phone] (office) [phone] (fax)

## 2017-05-02 NOTE — Telephone Encounter (Signed)
Called patient to inform of Ct on 05-11-17, arrival time - 1:45 pm @ [facility], no restrictions to test, lvm for a return call

## 2017-05-03 DIAGNOSIS — R2681 Unsteadiness on feet: Secondary | ICD-10-CM | POA: Diagnosis not present

## 2017-05-03 DIAGNOSIS — H542X22 Low vision right eye category 2, low vision left eye category 2: Secondary | ICD-10-CM | POA: Diagnosis not present

## 2017-05-03 DIAGNOSIS — R278 Other lack of coordination: Secondary | ICD-10-CM | POA: Diagnosis not present

## 2017-05-03 DIAGNOSIS — M6281 Muscle weakness (generalized): Secondary | ICD-10-CM | POA: Diagnosis not present

## 2017-05-04 DIAGNOSIS — H542X22 Low vision right eye category 2, low vision left eye category 2: Secondary | ICD-10-CM | POA: Diagnosis not present

## 2017-05-04 DIAGNOSIS — M6281 Muscle weakness (generalized): Secondary | ICD-10-CM | POA: Diagnosis not present

## 2017-05-04 DIAGNOSIS — R2681 Unsteadiness on feet: Secondary | ICD-10-CM | POA: Diagnosis not present

## 2017-05-04 DIAGNOSIS — R278 Other lack of coordination: Secondary | ICD-10-CM | POA: Diagnosis not present

## 2017-05-07 DIAGNOSIS — R278 Other lack of coordination: Secondary | ICD-10-CM | POA: Diagnosis not present

## 2017-05-07 DIAGNOSIS — M6281 Muscle weakness (generalized): Secondary | ICD-10-CM | POA: Diagnosis not present

## 2017-05-07 DIAGNOSIS — R2681 Unsteadiness on feet: Secondary | ICD-10-CM | POA: Diagnosis not present

## 2017-05-07 DIAGNOSIS — H542X22 Low vision right eye category 2, low vision left eye category 2: Secondary | ICD-10-CM | POA: Diagnosis not present

## 2017-05-08 ENCOUNTER — Telehealth: Payer: Self-pay | Admitting: Oncology

## 2017-05-08 ENCOUNTER — Telehealth: Payer: Self-pay | Admitting: *Deleted

## 2017-05-08 DIAGNOSIS — R2681 Unsteadiness on feet: Secondary | ICD-10-CM | POA: Diagnosis not present

## 2017-05-08 DIAGNOSIS — R278 Other lack of coordination: Secondary | ICD-10-CM | POA: Diagnosis not present

## 2017-05-08 DIAGNOSIS — M6281 Muscle weakness (generalized): Secondary | ICD-10-CM | POA: Diagnosis not present

## 2017-05-08 DIAGNOSIS — H542X22 Low vision right eye category 2, low vision left eye category 2: Secondary | ICD-10-CM | POA: Diagnosis not present
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
## 2017-05-09 ENCOUNTER — Other Ambulatory Visit: Payer: Self-pay | Admitting: Oncology

## 2017-05-09 ENCOUNTER — Telehealth: Payer: Self-pay | Admitting: *Deleted

## 2017-05-09 DIAGNOSIS — C3411 Malignant neoplasm of upper lobe, right bronchus or lung: Secondary | ICD-10-CM
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
## 2017-05-10 DIAGNOSIS — R278 Other lack of coordination: Secondary | ICD-10-CM | POA: Diagnosis not present

## 2017-05-10 DIAGNOSIS — H542X22 Low vision right eye category 2, low vision left eye category 2: Secondary | ICD-10-CM | POA: Diagnosis not present

## 2017-05-10 DIAGNOSIS — R2681 Unsteadiness on feet: Secondary | ICD-10-CM | POA: Diagnosis not present

## 2017-05-10 DIAGNOSIS — M6281 Muscle weakness (generalized): Secondary | ICD-10-CM | POA: Diagnosis not present

## 2017-05-11 ENCOUNTER — Ambulatory Visit (HOSPITAL_COMMUNITY): Payer: Medicare Other

## 2017-05-14 ENCOUNTER — Ambulatory Visit: Payer: PRIVATE HEALTH INSURANCE | Admitting: Radiation Oncology

## 2017-05-14 DIAGNOSIS — M6281 Muscle weakness (generalized): Secondary | ICD-10-CM | POA: Diagnosis not present

## 2017-05-14 DIAGNOSIS — R278 Other lack of coordination: Secondary | ICD-10-CM | POA: Diagnosis not present

## 2017-05-14 DIAGNOSIS — R2681 Unsteadiness on feet: Secondary | ICD-10-CM | POA: Diagnosis not present

## 2017-05-14 DIAGNOSIS — H542X22 Low vision right eye category 2, low vision left eye category 2: Secondary | ICD-10-CM | POA: Diagnosis not present

## 2017-05-15 DIAGNOSIS — H542X22 Low vision right eye category 2, low vision left eye category 2: Secondary | ICD-10-CM | POA: Diagnosis not present

## 2017-05-15 DIAGNOSIS — R278 Other lack of coordination: Secondary | ICD-10-CM | POA: Diagnosis not present

## 2017-05-15 DIAGNOSIS — R2681 Unsteadiness on feet: Secondary | ICD-10-CM | POA: Diagnosis not present

## 2017-05-15 DIAGNOSIS — M6281 Muscle weakness (generalized): Secondary | ICD-10-CM | POA: Diagnosis not present

## 2017-05-16 ENCOUNTER — Ambulatory Visit (HOSPITAL_COMMUNITY)
Admission: RE | Admit: 2017-05-16 | Discharge: 2017-05-16 | Disposition: A | Discharge status: Home / Self Care | Payer: Medicare Other | Source: Ambulatory Visit | Attending: Internal Medicine | Admitting: Internal Medicine

## 2017-05-16 ENCOUNTER — Other Ambulatory Visit: Payer: Self-pay | Admitting: Internal Medicine

## 2017-05-16 ENCOUNTER — Ambulatory Visit (HOSPITAL_COMMUNITY)
Admission: RE | Admit: 2017-05-16 | Discharge: 2017-05-16 | Disposition: A | Discharge status: Home / Self Care | Payer: Medicare Other | Source: Ambulatory Visit | Attending: Radiation Oncology | Admitting: Radiation Oncology

## 2017-05-16 DIAGNOSIS — M4854XA Collapsed vertebra, not elsewhere classified, thoracic region, initial encounter for fracture: Secondary | ICD-10-CM | POA: Insufficient documentation

## 2017-05-16 DIAGNOSIS — C3411 Malignant neoplasm of upper lobe, right bronchus or lung: Secondary | ICD-10-CM | POA: Diagnosis not present

## 2017-05-16 DIAGNOSIS — C3490 Malignant neoplasm of unspecified part of unspecified bronchus or lung: Secondary | ICD-10-CM | POA: Diagnosis not present

## 2017-05-16 DIAGNOSIS — J90 Pleural effusion, not elsewhere classified: Secondary | ICD-10-CM | POA: Insufficient documentation

## 2017-05-17 ENCOUNTER — Encounter: Payer: Self-pay | Admitting: Radiation Oncology

## 2017-05-17 ENCOUNTER — Ambulatory Visit
Admission: RE | Admit: 2017-05-17 | Discharge: 2017-05-17 | Disposition: A | Discharge status: Home / Self Care | Payer: Medicare Other | Source: Ambulatory Visit | Attending: Radiation Oncology | Admitting: Radiation Oncology

## 2017-05-17 VITALS — BP 152/77 | HR 82 | Temp 97.8°F | Ht 62.5 in | Wt 131.2 lb

## 2017-05-17 DIAGNOSIS — Z923 Personal history of irradiation: Secondary | ICD-10-CM | POA: Diagnosis not present

## 2017-05-17 DIAGNOSIS — I4891 Unspecified atrial fibrillation: Secondary | ICD-10-CM

## 2017-05-17 DIAGNOSIS — R531 Weakness: Secondary | ICD-10-CM | POA: Diagnosis not present

## 2017-05-17 DIAGNOSIS — Z08 Encounter for follow-up examination after completed treatment for malignant neoplasm: Secondary | ICD-10-CM | POA: Diagnosis not present

## 2017-05-17 DIAGNOSIS — R918 Other nonspecific abnormal finding of lung field: Secondary | ICD-10-CM | POA: Diagnosis not present

## 2017-05-17 DIAGNOSIS — C3411 Malignant neoplasm of upper lobe, right bronchus or lung: Secondary | ICD-10-CM | POA: Insufficient documentation

## 2017-05-17 NOTE — Progress Notes (Signed)
Radiation Oncology         [phone] ________________________________  Name: Jodi Burns MRN: [id number]  Date: 05/17/2017  DOB: October 09, 1925  Follow-Up Visit Note  CC: Jodi Infante, MD  Jodi Burns, *    ICD-10-CM   1. Mass of upper lobe of left lung R91.8   2. Primary cancer of right upper lobe of lung (HCC) C34.11     Diagnosis:   83 y.o. woman with Stage IB (T2a, N0, M0) non-small cell lung cancer, adenocarcinoma in the right upper lobe in addition to suspicious stage IA lesion in the left upper lobe diagnosed in February 2018.  Interval Since Last Radiation:  6 months  10/31/2016 - 11/13/2016 Site/dose:     Left upper lung treated to 54 Gy at 3 fractions.   Right upper lung to 50 Gy at 10 fractions.   Narrative:  Jodi Burns is here for follow up.  She reports having a headache today and also feels lightheaded. She reported having headaches when she is hungry or when she bends down.  She also feels very weak.  She is using a wheelchair today and said she uses a walker at home.  She reports having shortness of breath with activity.  She has oxygen at home and had to use it once a week ago.  She denies having a cough. Her husband had questions regarding what to look forward with his wife's tumor and possible procedures or side effects that may occur. Family was informed that the tumor has decreased in size and imaging was shown to the family, of before the treatment scan and after treatment.   ALLERGIES:  is allergic to cefaclor and codeine.  Meds: Current Outpatient Prescriptions  Medication Sig Dispense Refill  . acetaminophen (TYLENOL) 325 MG tablet Take 650 mg by mouth every 6 (six) hours as needed for mild pain.     Marland Kitchen amiodarone (PACERONE) 200 MG tablet Take 0.5 tablets (100 mg total) by mouth daily. 15 tablet 6  . apixaban (ELIQUIS) 2.5 MG TABS tablet Take 1 tablet (2.5 mg total) by mouth 2 (two) times daily. 60 tablet 0  . Cholecalciferol (VITAMIN  D3) 5000 units TABS Take 2 tablets by mouth daily.    . Coenzyme Q10 (COQ10) 200 MG CAPS Take 1 capsule by mouth daily.     . cyanocobalamin 1000 MCG tablet Take 2,000 mcg by mouth daily.    Marland Kitchen diltiazem (CARDIZEM SR) 60 MG 12 hr capsule Take 60 mg by mouth daily.     . dorzolamide-timolol (COSOPT) 22.3-6.8 MG/ML ophthalmic solution Place 1 drop into both eyes 2 (two) times daily.      . fluocinonide (LIDEX) 0.05 % external solution Apply 1 application topically once a week. scalp  1  . ipratropium (ATROVENT) 0.03 % nasal spray 1-2 puffs each nostril every 8 hours if needed 30 mL 12  . loratadine (CLARITIN) 10 MG tablet Take 10 mg by mouth daily as needed for allergies.     . Multiple Vitamins-Minerals (PRESERVISION/LUTEIN) CAPS Take 1 capsule by mouth 2 (two) times daily.      . OXYGEN 2LPM     . PROAIR HFA 108 (90 Base) MCG/ACT inhaler inhale 2 puffs by mouth every 6 hours if needed for wheezing or shortness of breath  0  . SYMBICORT 160-4.5 MCG/ACT inhaler inhale 2 puffs by mouth twice a day Rinse mouth after use 10.2 g 0   No current facility-administered medications for this encounter.  Physical Findings: The patient is in no acute distress. Patient is alert and oriented.  height is 5' 2.5" (1.588 m) and weight is 131 lb 3.2 oz (59.5 kg). Her oral temperature is 97.8 F (36.6 C). Her blood pressure is 152/77 (abnormal) and her pulse is 82. Her oxygen saturation is 99%. .  Orthostatic blood pressure measurements taken showing no significant change  Wt Readings from Last 3 Encounters:  05/17/17 131 lb 3.2 oz (59.5 kg)  05/02/17 130 lb (59 kg)  01/24/17 126 lb (57.2 kg)     Lungs are clear to auscultation bilaterally. Heart has regular rate and rhythm. No palpable cervical, supraclavicular, or axillary adenopathy.   Lab Findings: Lab Results  Component Value Date   WBC 7.4 10/04/2016   HGB 12.4 10/04/2016   HCT 39.6 10/04/2016   MCV 95.9 10/04/2016   PLT 228 10/04/2016     Radiographic Findings: Ct Chest Wo Contrast  Result Date: 05/16/2017 CLINICAL DATA:  Right lung cancer.  Status post XRT. EXAM: CT CHEST WITHOUT CONTRAST TECHNIQUE: Multidetector CT imaging of the chest was performed following the standard protocol without IV contrast. COMPARISON:  02/05/2017 FINDINGS: Cardiovascular: Normal heart size. No pericardial effusion. Aortic atherosclerosis. Calcification within the RCA and LAD coronary artery. Mediastinum/Nodes: The trachea appears patent and is midline. Normal appearance of the esophagus. No enlarged mediastinal or hilar lymph nodes. No axillary or supraclavicular adenopathy. Lungs/Pleura: Persistent small to moderate right pleural effusion. Pleural-parenchymal scarring identified within the lateral right lung base. Index lesion within the right upper lobe measures 2.1 by 2.4 cm, image 35 of series 5. Previously 2.3 x 2.9 cm. The index lesion within the left upper lobe Measures 1.2 by 1.2 cm, image 28 of series 5. Previously 1.7 x 0.9 cm. New masslike areas of architectural distortion within the anterior left upper lobe are identified and are favored to represent sequelae of external beam radiation. There is a small nodule within the right lower lobe measuring 4 mm. Unchanged from previous exam. Upper Abdomen: Liver and spleen calcifications noted. No acute abnormality. Musculoskeletal: There are no aggressive bone lesions. There is a compression deformity involving the superior endplate T11 vertebra which is new from 02/05/2017. Mild loss of the vertebral body height noted on the order of 15%. IMPRESSION: 1. The dominant lung mass within the right upper lobe demonstrates mild decrease in size in the interval. 2. No significant change and left upper lobe index lesion although there has been an increase in surrounding masslike architectural distortion which is favored to represent sequelae of external beam radiation. 3. Persistent right pleural effusion 4. New  mild superior endplate compression deformity involving the T11 vertebra. Electronically Signed   By: Kerby Moors M.D.   On: 05/16/2017 15:38    Impression: Recent chest CT scan shows favorable shrinkage of both lesions. No new problems, patient continues to be in general weak but will be proceeding with physical therapy to help with this issue. She does not have worsening of her breathing or chest pain or persistent headaches or hemoptysis. She denies any back pain.  Plan:   Patient will be scheduled for a  CT scan of chest  in three months, and a follow up afterwards.   ____________________________________  Blair Promise, PhD, MD  This document serves as a record of services personally performed by Gery Pray MD. It was created on his behalf by Delton Coombes, a trained medical scribe. The creation of this record is based on the scribe's personal  observations and the provider's statements to them. This document has been checked and approved by the attending provider.

## 2017-05-17 NOTE — Progress Notes (Signed)
Jodi Burns is here for follow up.  She reports having a headache today and also feels lightheaded.  She also feels very weak.  She is using a wheelchair today and said she uses a walker at home.  She reports having shortness of breath with activity.  She has oxygen at home and had to use it once a week ago.  She denies having a cough.    BP 138/73 (BP Location: Left Arm, Patient Position: Sitting)   Pulse 77   Temp 97.8 F (36.6 C) (Oral)   Ht 5' 2.5" (1.588 m)   Wt 131 lb 3.2 oz (59.5 kg)   LMP 08/29/1967   SpO2 99%   BMI 23.61 kg/m    Wt Readings from Last 3 Encounters:  05/17/17 131 lb 3.2 oz (59.5 kg)  05/02/17 130 lb (59 kg)  01/24/17 126 lb (57.2 kg)

## 2017-05-18 DIAGNOSIS — R278 Other lack of coordination: Secondary | ICD-10-CM | POA: Diagnosis not present

## 2017-05-18 DIAGNOSIS — M6281 Muscle weakness (generalized): Secondary | ICD-10-CM | POA: Diagnosis not present

## 2017-05-18 DIAGNOSIS — H542X22 Low vision right eye category 2, low vision left eye category 2: Secondary | ICD-10-CM | POA: Diagnosis not present

## 2017-05-18 DIAGNOSIS — R2681 Unsteadiness on feet: Secondary | ICD-10-CM | POA: Diagnosis not present

## 2017-05-21 DIAGNOSIS — H542X22 Low vision right eye category 2, low vision left eye category 2: Secondary | ICD-10-CM | POA: Diagnosis not present

## 2017-05-21 DIAGNOSIS — R2681 Unsteadiness on feet: Secondary | ICD-10-CM | POA: Diagnosis not present

## 2017-05-21 DIAGNOSIS — M6281 Muscle weakness (generalized): Secondary | ICD-10-CM | POA: Diagnosis not present

## 2017-05-21 DIAGNOSIS — R278 Other lack of coordination: Secondary | ICD-10-CM | POA: Diagnosis not present

## 2017-05-24 DIAGNOSIS — M6281 Muscle weakness (generalized): Secondary | ICD-10-CM | POA: Diagnosis not present

## 2017-05-24 DIAGNOSIS — H542X22 Low vision right eye category 2, low vision left eye category 2: Secondary | ICD-10-CM | POA: Diagnosis not present

## 2017-05-24 DIAGNOSIS — R278 Other lack of coordination: Secondary | ICD-10-CM | POA: Diagnosis not present

## 2017-05-24 DIAGNOSIS — R2681 Unsteadiness on feet: Secondary | ICD-10-CM | POA: Diagnosis not present

## 2017-05-25 DIAGNOSIS — H542X22 Low vision right eye category 2, low vision left eye category 2: Secondary | ICD-10-CM | POA: Diagnosis not present

## 2017-05-25 DIAGNOSIS — R2681 Unsteadiness on feet: Secondary | ICD-10-CM | POA: Diagnosis not present

## 2017-05-25 DIAGNOSIS — R278 Other lack of coordination: Secondary | ICD-10-CM | POA: Diagnosis not present

## 2017-05-25 DIAGNOSIS — M6281 Muscle weakness (generalized): Secondary | ICD-10-CM | POA: Diagnosis not present

## 2017-05-29 DIAGNOSIS — H542X22 Low vision right eye category 2, low vision left eye category 2: Secondary | ICD-10-CM | POA: Diagnosis not present

## 2017-05-29 DIAGNOSIS — R278 Other lack of coordination: Secondary | ICD-10-CM | POA: Diagnosis not present

## 2017-05-29 DIAGNOSIS — M6281 Muscle weakness (generalized): Secondary | ICD-10-CM | POA: Diagnosis not present

## 2017-05-29 DIAGNOSIS — M25512 Pain in left shoulder: Secondary | ICD-10-CM | POA: Diagnosis not present

## 2017-05-29 DIAGNOSIS — R2681 Unsteadiness on feet: Secondary | ICD-10-CM | POA: Diagnosis not present

## 2017-05-29 DIAGNOSIS — M25522 Pain in left elbow: Secondary | ICD-10-CM | POA: Diagnosis not present

## 2017-05-31 DIAGNOSIS — R278 Other lack of coordination: Secondary | ICD-10-CM | POA: Diagnosis not present

## 2017-05-31 DIAGNOSIS — M25522 Pain in left elbow: Secondary | ICD-10-CM | POA: Diagnosis not present

## 2017-05-31 DIAGNOSIS — H542X22 Low vision right eye category 2, low vision left eye category 2: Secondary | ICD-10-CM | POA: Diagnosis not present

## 2017-05-31 DIAGNOSIS — M25512 Pain in left shoulder: Secondary | ICD-10-CM | POA: Diagnosis not present

## 2017-05-31 DIAGNOSIS — M6281 Muscle weakness (generalized): Secondary | ICD-10-CM | POA: Diagnosis not present

## 2017-05-31 DIAGNOSIS — R2681 Unsteadiness on feet: Secondary | ICD-10-CM | POA: Diagnosis not present

## 2017-06-04 ENCOUNTER — Ambulatory Visit (HOSPITAL_COMMUNITY): Admission: RE | Admit: 2017-06-04 | Payer: Medicare Other | Source: Ambulatory Visit

## 2017-06-04 DIAGNOSIS — M25512 Pain in left shoulder: Secondary | ICD-10-CM | POA: Diagnosis not present

## 2017-06-04 DIAGNOSIS — M6281 Muscle weakness (generalized): Secondary | ICD-10-CM | POA: Diagnosis not present

## 2017-06-04 DIAGNOSIS — R278 Other lack of coordination: Secondary | ICD-10-CM | POA: Diagnosis not present

## 2017-06-04 DIAGNOSIS — M25522 Pain in left elbow: Secondary | ICD-10-CM | POA: Diagnosis not present

## 2017-06-04 DIAGNOSIS — R2681 Unsteadiness on feet: Secondary | ICD-10-CM | POA: Diagnosis not present

## 2017-06-04 DIAGNOSIS — H542X22 Low vision right eye category 2, low vision left eye category 2: Secondary | ICD-10-CM | POA: Diagnosis not present

## 2017-06-05 DIAGNOSIS — M25522 Pain in left elbow: Secondary | ICD-10-CM | POA: Diagnosis not present

## 2017-06-05 DIAGNOSIS — M6281 Muscle weakness (generalized): Secondary | ICD-10-CM | POA: Diagnosis not present

## 2017-06-05 DIAGNOSIS — M25512 Pain in left shoulder: Secondary | ICD-10-CM | POA: Diagnosis not present

## 2017-06-05 DIAGNOSIS — R278 Other lack of coordination: Secondary | ICD-10-CM | POA: Diagnosis not present

## 2017-06-05 DIAGNOSIS — R2681 Unsteadiness on feet: Secondary | ICD-10-CM | POA: Diagnosis not present

## 2017-06-05 DIAGNOSIS — H542X22 Low vision right eye category 2, low vision left eye category 2: Secondary | ICD-10-CM | POA: Diagnosis not present

## 2017-06-07 DIAGNOSIS — M6281 Muscle weakness (generalized): Secondary | ICD-10-CM | POA: Diagnosis not present

## 2017-06-07 DIAGNOSIS — M25522 Pain in left elbow: Secondary | ICD-10-CM | POA: Diagnosis not present

## 2017-06-07 DIAGNOSIS — H542X22 Low vision right eye category 2, low vision left eye category 2: Secondary | ICD-10-CM | POA: Diagnosis not present

## 2017-06-07 DIAGNOSIS — R2681 Unsteadiness on feet: Secondary | ICD-10-CM | POA: Diagnosis not present

## 2017-06-07 DIAGNOSIS — M25512 Pain in left shoulder: Secondary | ICD-10-CM | POA: Diagnosis not present

## 2017-06-07 DIAGNOSIS — R278 Other lack of coordination: Secondary | ICD-10-CM | POA: Diagnosis not present

## 2017-06-08 IMAGING — CT CT IMAGE GUIDED DRAINAGE BY PERCUTANEOUS CATHETER
3 of 7 series · 12 of 32 positions shown, 17 images · non-contrast
Comparison: PET-CT - 09/15/2016;

INDICATION: Indeterminate hypermetabolic right upper lobe pulmonary mass. Please
perform CT-guided biopsy for tissue diagnostic purposes.

EXAM:
1. CT-GUIDED RIGHT UPPER LOBE PULMONARY MASS BIOPSY
2. CT-GUIDED RIGHT-SIDED CHEST TUBE PLACEMENT

[Series 2: i-spiral 5.0 b40f · axial · 0.48mm/px · z∈[+1312,+1386]mm · 4 of 36 slices shown (1 of 3)]
[im 8/36  soft-tissue]
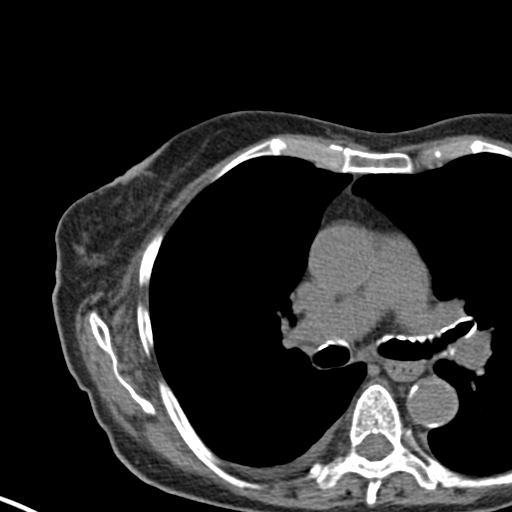
[im 15/36  soft-tissue]
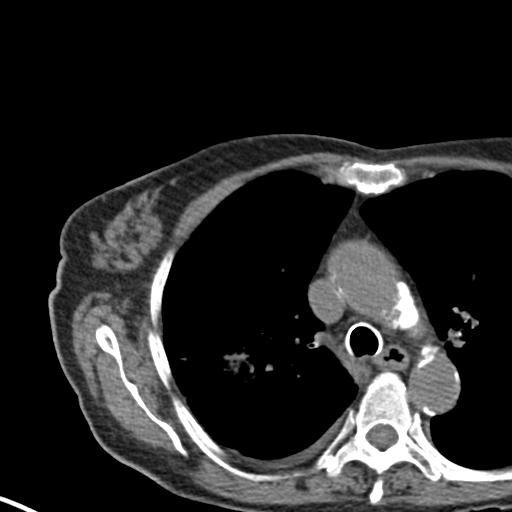
[im 22/36  soft-tissue]
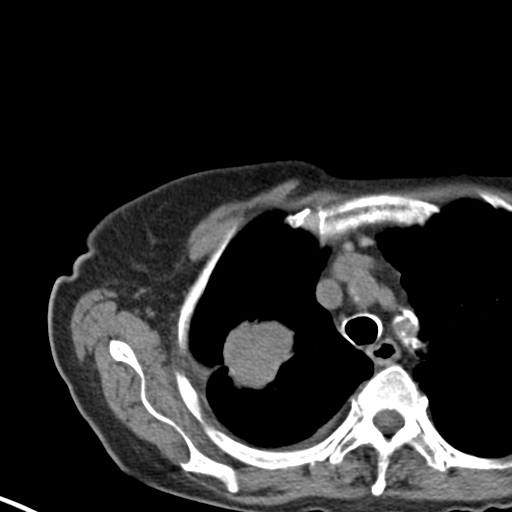
[im 29/36  soft-tissue]
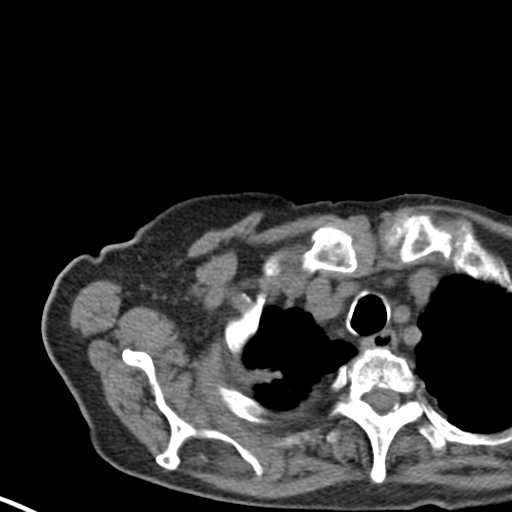

[Series 6: i-spiral 5.0 b40f · axial · 0.49mm/px · z∈[+1316,+1340]mm · 2 of 37 slices shown (2 of 3)]
[im 8/37  soft-tissue]
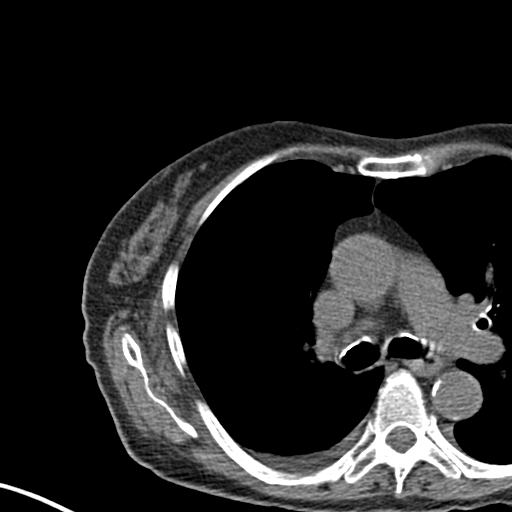
[im 15/37  soft-tissue]
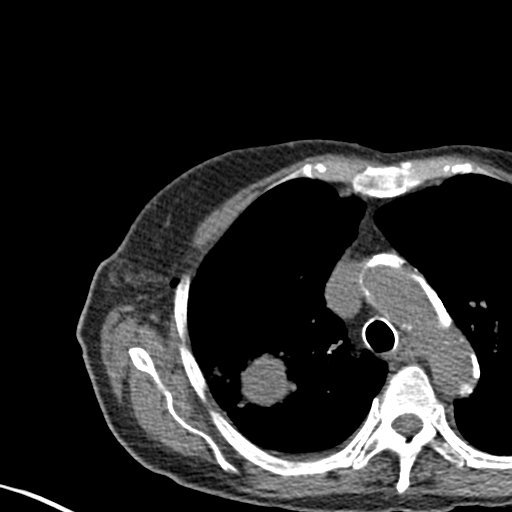

[Series 10: i-spiral 5.0 b40f · axial · 0.71mm/px · z∈[+1268,+1391]mm · 6 of 49 slices shown, 11 images (3 of 3)]
[im 7/49  soft-tissue]
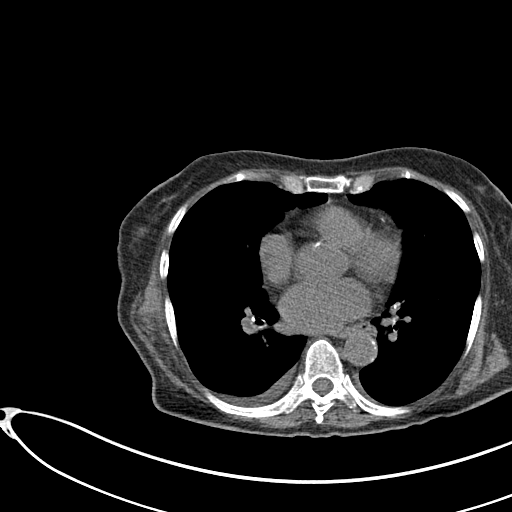
[im 7/49  bone]
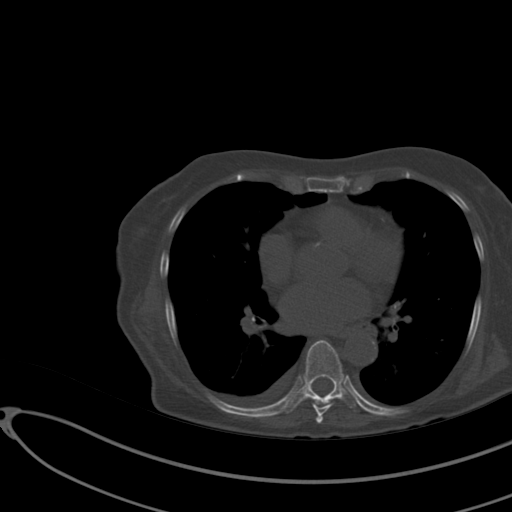
[im 14/49  soft-tissue]
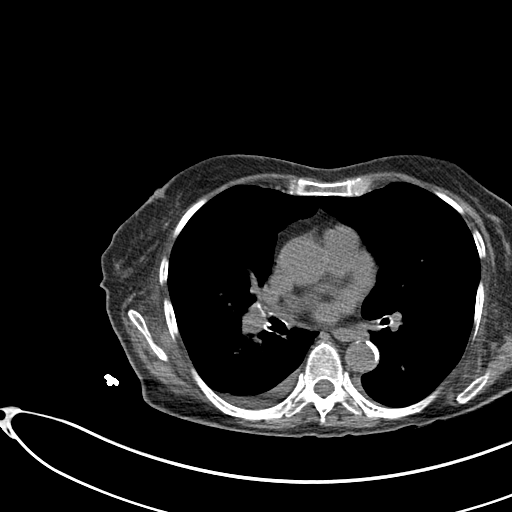
[im 21/49  soft-tissue]
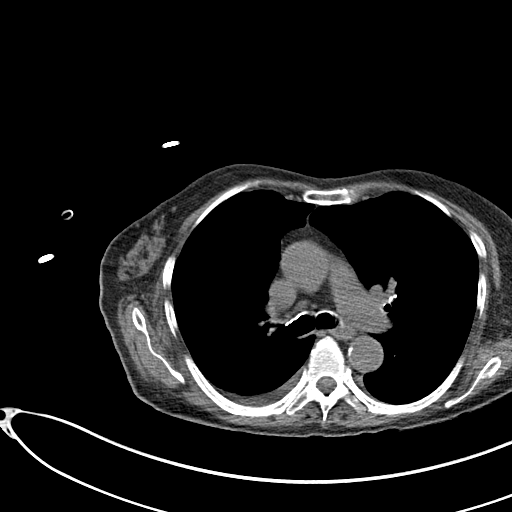
[im 21/49  lung]
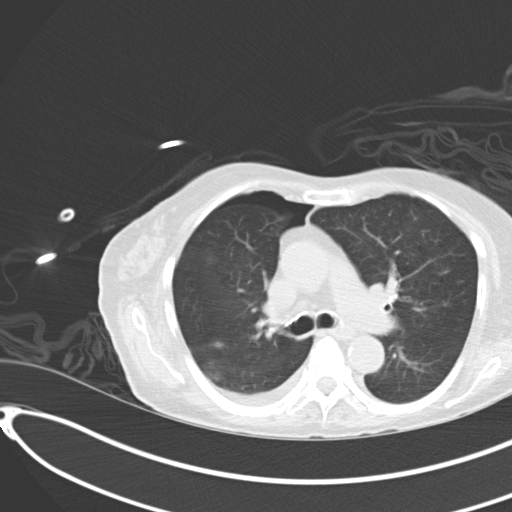
[im 28/49  soft-tissue]
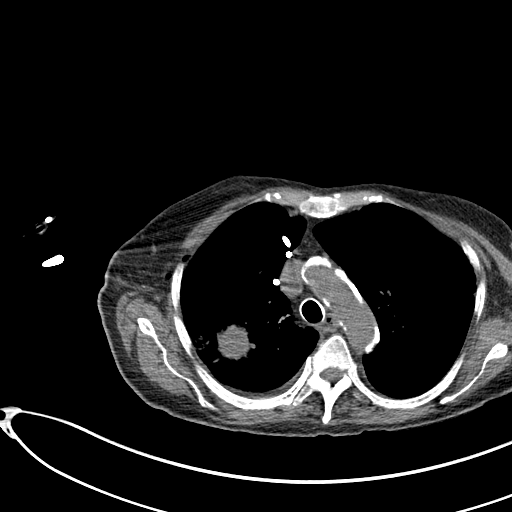
[im 28/49  lung]
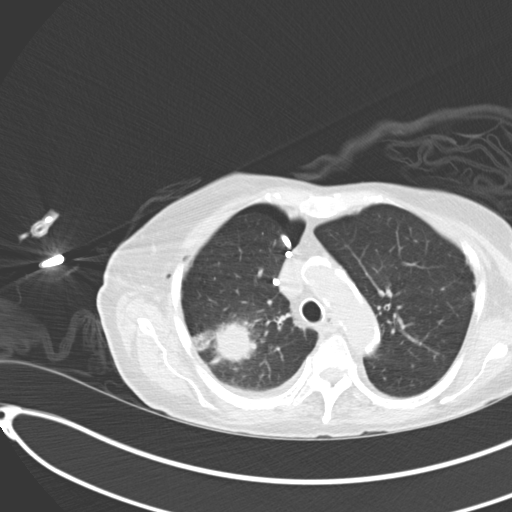
[im 35/49  soft-tissue]
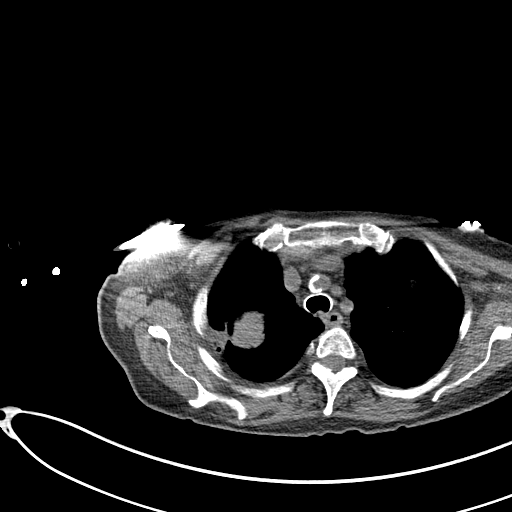
[im 35/49  lung]
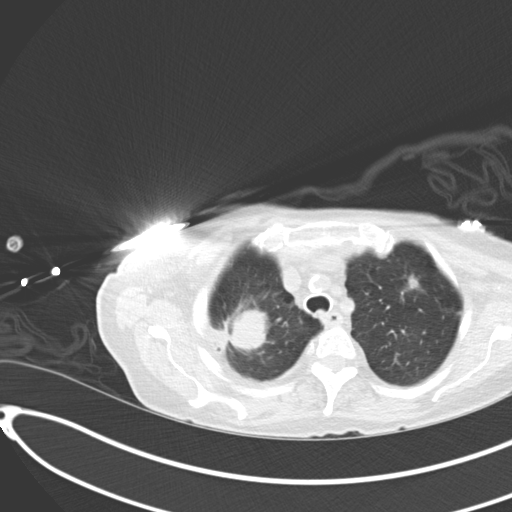
[im 42/49  soft-tissue]
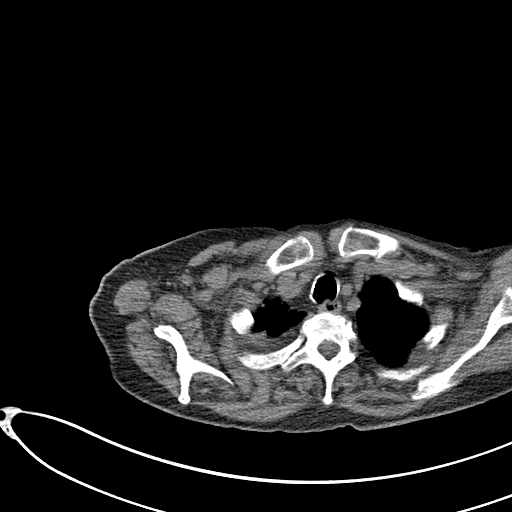
[im 42/49  lung]
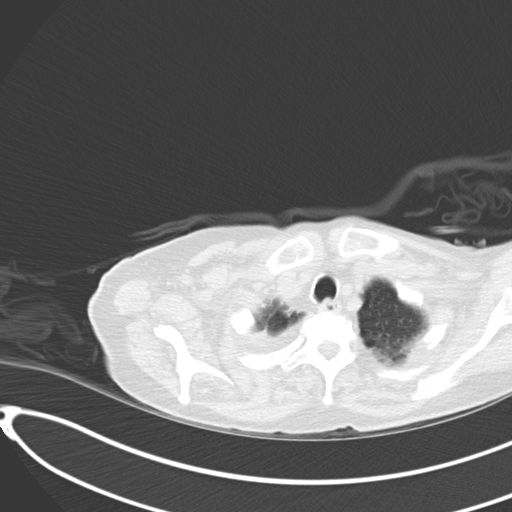

[12 of 32 positions shown; findings below may reference images not displayed]

chest CT - 08/20/2016

MEDICATIONS:
None.

ANESTHESIA/SEDATION:
Fentanyl 2 mcg IV; Versed 75 mg IV

Sedation time: 44 minutes; The patient was continuously monitored
during the procedure by the interventional radiology nurse under my
direct supervision.

CONTRAST:  None

COMPLICATIONS:
SIR LEVEL C - Requires therapy, minor hospitalization (<48 hrs).

Procedure complicated by development of a asymptomatic though slowly
enlarging right-sided pneumothorax requiring placement of a
CT-guided chest tube.

PROCEDURE:
Informed consent was obtained from the patient following an
explanation of the procedure, risks, benefits and alternatives. The
patient understands,agrees and consents for the procedure. All
questions were addressed. A time out was performed prior to the
initiation of the procedure.

The patient was positioned supine on the CT table and a limited
chest CT was performed for procedural planning demonstrating
unchanged size and appearance of the known macrolobulated
approximately 4.3 x 3.4 cm mass within the right upper lobe (image
17, series 2). The operative site was prepped and draped in the
usual sterile fashion. Under sterile conditions and local
anesthesia, a 17 gauge coaxial needle was advanced into the
peripheral aspect of the nodule. Positioning was confirmed with
intermittent CT fluoroscopy and followed by the acquisition of 4
core needle biopsy samples with an 18 gauge core needle biopsy
device. The coaxial needle was removed and superficial hemostasis
was achieved with manual compression.

Postprocedural imaging demonstrated development of a small
postprocedural pneumothorax. While the patient remained
asymptomatic, 15 minute delayed imaging demonstrated interval
enlargement of the right-sided pneumothorax and the decision was
made to place a CT-guided chest tube given pneumothorax enlargement.

The skin overlying the right anterior upper chest was prepped and
draped in usual sterile fashion. A 22 gauge needle was utilized for
trajectory planning purposes after the overlying soft tissues were
anesthetized with 1% lidocaine. An 18 gauge trocar needle was
advanced into the right pleural space and Amplatz wire was coiled
within the medial aspect of the right hemithorax. Appropriate
positioning was confirmed. The track was dilated allowing placement
of 10 French percutaneous drainage catheter. The drainage catheter
was secured at the skin entrance site within interrupted suture and
connected to a Pleur-Evac device.

Postprocedural imaging was obtained demonstrating near complete
evacuation of the right-sided pneumothorax.

Dressings were placed. The patient otherwise tolerated the procedure
well immediate postprocedural complication.
IMPRESSION: 1. Technically successful CT guided core needle core biopsy of right
upper lobe pulmonary mass biopsy.
2. Procedure complicated by development of a slowly enlarging though
asymptomatic pneumothorax necessitating placement of a right-sided
chest tube.

PLAN:
Patient will be admitted for continued observation and chest tube
management.

## 2017-06-08 IMAGING — DX DG CHEST 1V PORT
1 series · 1 of 1 positions shown · non-contrast
Comparison: PET-CT dated 09/15/2016

CLINICAL DATA: Chest tube in place

EXAM:
PORTABLE CHEST 1 VIEW

[chest ap]
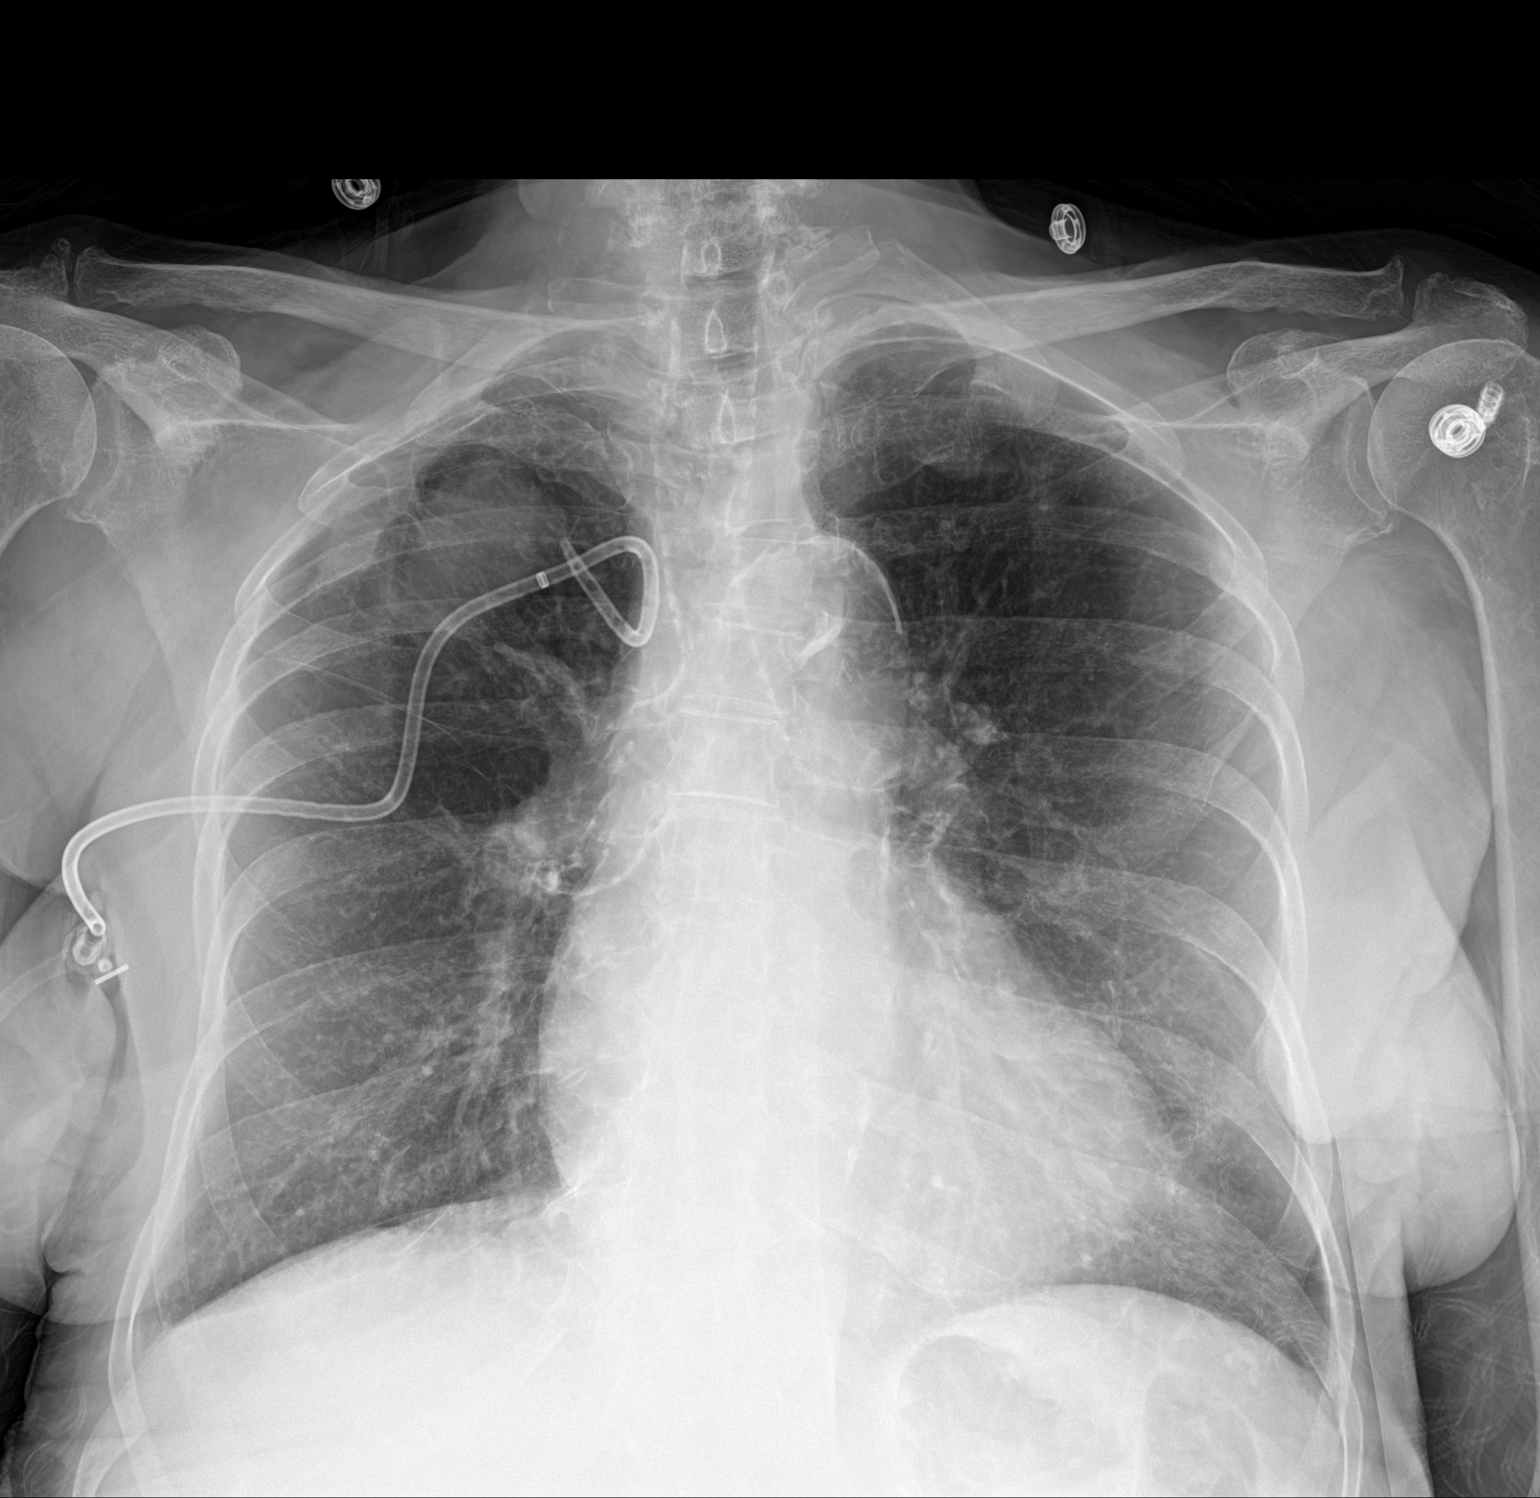

[1 of 1 positions shown; findings below may reference images not displayed]

FINDINGS: Right upper lobe mass.

Small bore chest drain overlying the medial right upper lung. No
pneumothorax is seen.

Left lung is clear.

The heart is normal in size.

Thoracic aortic atherosclerosis.
IMPRESSION: Small bore chest drain overlying the medial right upper lung. No
pneumothorax is seen.

Right upper lobe mass.

Thoracic aortic atherosclerosis.

## 2017-06-11 DIAGNOSIS — R278 Other lack of coordination: Secondary | ICD-10-CM | POA: Diagnosis not present

## 2017-06-11 DIAGNOSIS — H542X22 Low vision right eye category 2, low vision left eye category 2: Secondary | ICD-10-CM | POA: Diagnosis not present

## 2017-06-11 DIAGNOSIS — R2681 Unsteadiness on feet: Secondary | ICD-10-CM | POA: Diagnosis not present

## 2017-06-11 DIAGNOSIS — M25512 Pain in left shoulder: Secondary | ICD-10-CM | POA: Diagnosis not present

## 2017-06-11 DIAGNOSIS — M25522 Pain in left elbow: Secondary | ICD-10-CM | POA: Diagnosis not present

## 2017-06-11 DIAGNOSIS — M6281 Muscle weakness (generalized): Secondary | ICD-10-CM | POA: Diagnosis not present

## 2017-06-12 DIAGNOSIS — M6281 Muscle weakness (generalized): Secondary | ICD-10-CM | POA: Diagnosis not present

## 2017-06-12 DIAGNOSIS — R278 Other lack of coordination: Secondary | ICD-10-CM | POA: Diagnosis not present

## 2017-06-12 DIAGNOSIS — H542X22 Low vision right eye category 2, low vision left eye category 2: Secondary | ICD-10-CM | POA: Diagnosis not present

## 2017-06-12 DIAGNOSIS — M25512 Pain in left shoulder: Secondary | ICD-10-CM | POA: Diagnosis not present

## 2017-06-12 DIAGNOSIS — R2681 Unsteadiness on feet: Secondary | ICD-10-CM | POA: Diagnosis not present

## 2017-06-12 DIAGNOSIS — M25522 Pain in left elbow: Secondary | ICD-10-CM | POA: Diagnosis not present

## 2017-06-14 ENCOUNTER — Encounter (HOSPITAL_COMMUNITY): Payer: Self-pay

## 2017-06-14 ENCOUNTER — Ambulatory Visit (HOSPITAL_COMMUNITY)
Admission: RE | Admit: 2017-06-14 | Discharge: 2017-06-14 | Disposition: A | Discharge status: Home / Self Care | Payer: Medicare Other | Source: Ambulatory Visit | Attending: Internal Medicine | Admitting: Internal Medicine

## 2017-06-14 DIAGNOSIS — M810 Age-related osteoporosis without current pathological fracture: Secondary | ICD-10-CM | POA: Diagnosis not present

## 2017-06-14 DIAGNOSIS — R278 Other lack of coordination: Secondary | ICD-10-CM | POA: Diagnosis not present

## 2017-06-14 DIAGNOSIS — H542X22 Low vision right eye category 2, low vision left eye category 2: Secondary | ICD-10-CM | POA: Diagnosis not present

## 2017-06-14 DIAGNOSIS — R2681 Unsteadiness on feet: Secondary | ICD-10-CM | POA: Diagnosis not present

## 2017-06-14 DIAGNOSIS — M25512 Pain in left shoulder: Secondary | ICD-10-CM | POA: Diagnosis not present

## 2017-06-14 DIAGNOSIS — M6281 Muscle weakness (generalized): Secondary | ICD-10-CM | POA: Diagnosis not present

## 2017-06-14 DIAGNOSIS — M25522 Pain in left elbow: Secondary | ICD-10-CM | POA: Diagnosis not present

## 2017-06-14 MED ORDER — DENOSUMAB 60 MG/ML ~~LOC~~ SOLN
60.0000 mg | Freq: Once | SUBCUTANEOUS | Status: AC
  Administered 2017-06-14: 60 mg via SUBCUTANEOUS
  Filled 2017-06-14: qty 1

## 2017-06-14 NOTE — Progress Notes (Signed)
Pt given Prolia 60 mg SQ today in rt upper arm.  Pt husband with her, gave him the d/c instructions on prolia, as he says the pt's sight is not good.  Inforfmed pt and her husband to contact the office regarding the next appointment date/time and location.  Informed pt and her spouse the next injection is due April 2019. They voiced understanding.

## 2017-06-14 NOTE — Discharge Instructions (Signed)
Contact you MD about next appointment date/location  Denosumab injection What is this medicine? DENOSUMAB (den oh sue mab) slows bone breakdown. Prolia is used to treat osteoporosis in women after menopause and in men. Delton See is used to treat a high calcium level due to cancer and to prevent bone fractures and other bone problems caused by multiple myeloma or cancer bone metastases. Delton See is also used to treat giant cell tumor of the bone. This medicine may be used for other purposes; ask your health care provider or pharmacist if you have questions. COMMON BRAND NAME(S): Prolia, XGEVA What should I tell my health care provider before I take this medicine? They need to know if you have any of these conditions: -dental disease -having surgery or tooth extraction -infection -kidney disease -low levels of calcium or Vitamin D in the blood -malnutrition -on hemodialysis -skin conditions or sensitivity -thyroid or parathyroid disease -an unusual reaction to denosumab, other medicines, foods, dyes, or preservatives -pregnant or trying to get pregnant -breast-feeding How should I use this medicine? This medicine is for injection under the skin. It is given by a health care professional in a hospital or clinic setting. If you are getting Prolia, a special MedGuide will be given to you by the pharmacist with each prescription and refill. Be sure to read this information carefully each time. For Prolia, talk to your pediatrician regarding the use of this medicine in children. Special care may be needed. For Delton See, talk to your pediatrician regarding the use of this medicine in children. While this drug may be prescribed for children as young as 13 years for selected conditions, precautions do apply. Overdosage: If you think you have taken too much of this medicine contact a poison control center or emergency room at once. NOTE: This medicine is only for you. Do not share this medicine with  others. What if I miss a dose? It is important not to miss your dose. Call your doctor or health care professional if you are unable to keep an appointment. What may interact with this medicine? Do not take this medicine with any of the following medications: -other medicines containing denosumab This medicine may also interact with the following medications: -medicines that lower your chance of fighting infection -steroid medicines like prednisone or cortisone This list may not describe all possible interactions. Give your health care provider a list of all the medicines, herbs, non-prescription drugs, or dietary supplements you use. Also tell them if you smoke, drink alcohol, or use illegal drugs. Some items may interact with your medicine. What should I watch for while using this medicine? Visit your doctor or health care professional for regular checks on your progress. Your doctor or health care professional may order blood tests and other tests to see how you are doing. Call your doctor or health care professional for advice if you get a fever, chills or sore throat, or other symptoms of a cold or flu. Do not treat yourself. This drug may decrease your body's ability to fight infection. Try to avoid being around people who are sick. You should make sure you get enough calcium and vitamin D while you are taking this medicine, unless your doctor tells you not to. Discuss the foods you eat and the vitamins you take with your health care professional. See your dentist regularly. Brush and floss your teeth as directed. Before you have any dental work done, tell your dentist you are receiving this medicine. Do not become pregnant while taking  this medicine or for 5 months after stopping it. Talk with your doctor or health care professional about your birth control options while taking this medicine. Women should inform their doctor if they wish to become pregnant or think they might be pregnant. There  is a potential for serious side effects to an unborn child. Talk to your health care professional or pharmacist for more information. What side effects may I notice from receiving this medicine? Side effects that you should report to your doctor or health care professional as soon as possible: -allergic reactions like skin rash, itching or hives, swelling of the face, lips, or tongue -bone pain -breathing problems -dizziness -jaw pain, especially after dental work -redness, blistering, peeling of the skin -signs and symptoms of infection like fever or chills; cough; sore throat; pain or trouble passing urine -signs of low calcium like fast heartbeat, muscle cramps or muscle pain; pain, tingling, numbness in the hands or feet; seizures -unusual bleeding or bruising -unusually weak or tired Side effects that usually do not require medical attention (report to your doctor or health care professional if they continue or are bothersome): -constipation -diarrhea -headache -joint pain -loss of appetite -muscle pain -runny nose -tiredness -upset stomach This list may not describe all possible side effects. Call your doctor for medical advice about side effects. You may report side effects to FDA at 1-800-FDA-1088. Where should I keep my medicine? This medicine is only given in a clinic, doctor's office, or other health care setting and will not be stored at home. NOTE: This sheet is a summary. It may not cover all possible information. If you have questions about this medicine, talk to your doctor, pharmacist, or health care provider.  2018 Elsevier/Gold Standard (2016-09-05 19:17:21)

## 2017-06-18 DIAGNOSIS — R2681 Unsteadiness on feet: Secondary | ICD-10-CM | POA: Diagnosis not present

## 2017-06-18 DIAGNOSIS — R278 Other lack of coordination: Secondary | ICD-10-CM | POA: Diagnosis not present

## 2017-06-18 DIAGNOSIS — H542X22 Low vision right eye category 2, low vision left eye category 2: Secondary | ICD-10-CM | POA: Diagnosis not present

## 2017-06-18 DIAGNOSIS — M6281 Muscle weakness (generalized): Secondary | ICD-10-CM | POA: Diagnosis not present

## 2017-06-18 DIAGNOSIS — M25512 Pain in left shoulder: Secondary | ICD-10-CM | POA: Diagnosis not present

## 2017-06-18 DIAGNOSIS — M25522 Pain in left elbow: Secondary | ICD-10-CM | POA: Diagnosis not present

## 2017-06-21 DIAGNOSIS — M25522 Pain in left elbow: Secondary | ICD-10-CM | POA: Diagnosis not present

## 2017-06-21 DIAGNOSIS — R278 Other lack of coordination: Secondary | ICD-10-CM | POA: Diagnosis not present

## 2017-06-21 DIAGNOSIS — M25512 Pain in left shoulder: Secondary | ICD-10-CM | POA: Diagnosis not present

## 2017-06-21 DIAGNOSIS — R2681 Unsteadiness on feet: Secondary | ICD-10-CM | POA: Diagnosis not present

## 2017-06-21 DIAGNOSIS — M6281 Muscle weakness (generalized): Secondary | ICD-10-CM | POA: Diagnosis not present

## 2017-06-21 DIAGNOSIS — H542X22 Low vision right eye category 2, low vision left eye category 2: Secondary | ICD-10-CM | POA: Diagnosis not present

## 2017-06-25 DIAGNOSIS — H542X22 Low vision right eye category 2, low vision left eye category 2: Secondary | ICD-10-CM | POA: Diagnosis not present

## 2017-06-25 DIAGNOSIS — M25512 Pain in left shoulder: Secondary | ICD-10-CM | POA: Diagnosis not present

## 2017-06-25 DIAGNOSIS — M25522 Pain in left elbow: Secondary | ICD-10-CM | POA: Diagnosis not present

## 2017-06-25 DIAGNOSIS — M6281 Muscle weakness (generalized): Secondary | ICD-10-CM | POA: Diagnosis not present

## 2017-06-25 DIAGNOSIS — R278 Other lack of coordination: Secondary | ICD-10-CM | POA: Diagnosis not present

## 2017-06-25 DIAGNOSIS — R2681 Unsteadiness on feet: Secondary | ICD-10-CM | POA: Diagnosis not present

## 2017-06-27 DIAGNOSIS — Z6822 Body mass index (BMI) 22.0-22.9, adult: Secondary | ICD-10-CM | POA: Diagnosis not present

## 2017-06-27 DIAGNOSIS — R531 Weakness: Secondary | ICD-10-CM | POA: Diagnosis not present

## 2017-06-27 DIAGNOSIS — R2689 Other abnormalities of gait and mobility: Secondary | ICD-10-CM | POA: Diagnosis not present

## 2017-06-27 DIAGNOSIS — M25512 Pain in left shoulder: Secondary | ICD-10-CM | POA: Diagnosis not present

## 2017-06-27 DIAGNOSIS — M25522 Pain in left elbow: Secondary | ICD-10-CM | POA: Diagnosis not present

## 2017-06-28 DIAGNOSIS — H353134 Nonexudative age-related macular degeneration, bilateral, advanced atrophic with subfoveal involvement: Secondary | ICD-10-CM | POA: Diagnosis not present

## 2017-06-28 DIAGNOSIS — H401134 Primary open-angle glaucoma, bilateral, indeterminate stage: Secondary | ICD-10-CM | POA: Diagnosis not present

## 2017-06-28 DIAGNOSIS — Z961 Presence of intraocular lens: Secondary | ICD-10-CM | POA: Diagnosis not present

## 2017-07-06 DIAGNOSIS — M778 Other enthesopathies, not elsewhere classified: Secondary | ICD-10-CM | POA: Diagnosis not present

## 2017-07-17 DIAGNOSIS — M25522 Pain in left elbow: Secondary | ICD-10-CM | POA: Diagnosis not present

## 2017-07-17 DIAGNOSIS — Z23 Encounter for immunization: Secondary | ICD-10-CM | POA: Diagnosis not present

## 2017-07-17 DIAGNOSIS — Z6823 Body mass index (BMI) 23.0-23.9, adult: Secondary | ICD-10-CM | POA: Diagnosis not present

## 2017-07-17 DIAGNOSIS — I480 Paroxysmal atrial fibrillation: Secondary | ICD-10-CM | POA: Diagnosis not present

## 2017-07-17 DIAGNOSIS — C3490 Malignant neoplasm of unspecified part of unspecified bronchus or lung: Secondary | ICD-10-CM | POA: Diagnosis not present

## 2017-07-26 DIAGNOSIS — M6281 Muscle weakness (generalized): Secondary | ICD-10-CM | POA: Diagnosis not present

## 2017-07-26 DIAGNOSIS — H542X22 Low vision right eye category 2, low vision left eye category 2: Secondary | ICD-10-CM | POA: Diagnosis not present

## 2017-07-26 DIAGNOSIS — M25522 Pain in left elbow: Secondary | ICD-10-CM | POA: Diagnosis not present

## 2017-07-26 DIAGNOSIS — M25512 Pain in left shoulder: Secondary | ICD-10-CM | POA: Diagnosis not present

## 2017-08-03 ENCOUNTER — Ambulatory Visit (INDEPENDENT_AMBULATORY_CARE_PROVIDER_SITE_OTHER): Payer: Medicare Other | Admitting: Podiatry

## 2017-08-03 ENCOUNTER — Encounter: Payer: Self-pay | Admitting: Podiatry

## 2017-08-03 ENCOUNTER — Telehealth: Payer: Self-pay | Admitting: *Deleted

## 2017-08-03 DIAGNOSIS — B351 Tinea unguium: Secondary | ICD-10-CM | POA: Diagnosis not present

## 2017-08-03 DIAGNOSIS — M79674 Pain in right toe(s): Secondary | ICD-10-CM | POA: Diagnosis not present

## 2017-08-03 DIAGNOSIS — M79675 Pain in left toe(s): Secondary | ICD-10-CM

## 2017-08-03 DIAGNOSIS — Q828 Other specified congenital malformations of skin: Secondary | ICD-10-CM

## 2017-08-03 DIAGNOSIS — D689 Coagulation defect, unspecified: Secondary | ICD-10-CM | POA: Diagnosis not present

## 2017-08-03 NOTE — Telephone Encounter (Signed)
CALLED PATIENT TO INFORM OF CT FOR 08-16-17 - ARRIVAL TIME - 10:15 AM @ [facility], NO RESTRICTIONS TO TEST AND FU WITH DR. KINARD ON 08-30-17, SPOKE WITH PATIENT'S HUSBAND AND HE IS AWARE OF THESE APPTS.

## 2017-08-07 NOTE — Progress Notes (Signed)
Subjective:   Patient ID: Jodi Burns, female   DOB: 83 y.o.   MRN: [id number]   HPI Patient presents with significant nail disease 1-5 both feet and lesions on both feet that get tender with palpation.   ROS      Objective:  Physical Exam  Neurovascular status intact negative Homans sign was noted with patient found to have severely thickened nailbeds 1-5 both feet that are incurvated and painful and lesions bilateral with patient being on blood thinner and at risk for treatment.     Assessment:  Mycotic nail infection with pain 1-5 both feet along with lesions bilateral that are painful with at risk of blood clotting disorder.     Plan:  H&P condition reviewed and debridement nailbeds 1-5 both feet accomplished with no iatrogenic bleeding and debridement of lesions bilateral with no iatrogenic bleeding.  Patient will be seen back for [place] to recheck and will be seen earlier if any issues should occur

## 2017-08-16 ENCOUNTER — Ambulatory Visit (HOSPITAL_COMMUNITY)
Admission: RE | Admit: 2017-08-16 | Discharge: 2017-08-16 | Disposition: A | Discharge status: Home / Self Care | Payer: Medicare Other | Source: Ambulatory Visit | Attending: Radiation Oncology | Admitting: Radiation Oncology

## 2017-08-16 DIAGNOSIS — C3411 Malignant neoplasm of upper lobe, right bronchus or lung: Secondary | ICD-10-CM | POA: Diagnosis not present

## 2017-08-16 DIAGNOSIS — I700 Atherosclerosis of aorta: Secondary | ICD-10-CM | POA: Insufficient documentation

## 2017-08-16 DIAGNOSIS — R937 Abnormal findings on diagnostic imaging of other parts of musculoskeletal system: Secondary | ICD-10-CM | POA: Insufficient documentation

## 2017-08-16 DIAGNOSIS — R911 Solitary pulmonary nodule: Secondary | ICD-10-CM | POA: Diagnosis not present

## 2017-08-30 ENCOUNTER — Ambulatory Visit: Payer: Medicare Other | Admitting: Radiation Oncology

## 2017-09-06 ENCOUNTER — Other Ambulatory Visit: Payer: Self-pay | Admitting: Radiation Oncology

## 2017-09-06 DIAGNOSIS — M545 Low back pain: Secondary | ICD-10-CM | POA: Diagnosis not present

## 2017-09-06 DIAGNOSIS — I480 Paroxysmal atrial fibrillation: Secondary | ICD-10-CM | POA: Diagnosis not present

## 2017-09-06 DIAGNOSIS — Z6822 Body mass index (BMI) 22.0-22.9, adult: Secondary | ICD-10-CM | POA: Diagnosis not present

## 2017-09-06 DIAGNOSIS — R2689 Other abnormalities of gait and mobility: Secondary | ICD-10-CM | POA: Diagnosis not present

## 2017-09-06 DIAGNOSIS — C3490 Malignant neoplasm of unspecified part of unspecified bronchus or lung: Secondary | ICD-10-CM | POA: Diagnosis not present

## 2017-09-06 DIAGNOSIS — M1990 Unspecified osteoarthritis, unspecified site: Secondary | ICD-10-CM | POA: Diagnosis not present

## 2017-09-06 DIAGNOSIS — M5431 Sciatica, right side: Secondary | ICD-10-CM | POA: Diagnosis not present

## 2017-09-06 DIAGNOSIS — C3411 Malignant neoplasm of upper lobe, right bronchus or lung: Secondary | ICD-10-CM

## 2017-09-07 ENCOUNTER — Other Ambulatory Visit: Payer: Self-pay | Admitting: Internal Medicine

## 2017-09-07 DIAGNOSIS — M545 Low back pain: Secondary | ICD-10-CM

## 2017-09-07 DIAGNOSIS — C3490 Malignant neoplasm of unspecified part of unspecified bronchus or lung: Secondary | ICD-10-CM

## 2017-09-14 ENCOUNTER — Ambulatory Visit
Admission: RE | Admit: 2017-09-14 | Discharge: 2017-09-14 | Disposition: A | Discharge status: Home / Self Care | Payer: Medicare Other | Source: Ambulatory Visit | Attending: Internal Medicine | Admitting: Internal Medicine

## 2017-09-14 DIAGNOSIS — M545 Low back pain: Secondary | ICD-10-CM

## 2017-09-14 DIAGNOSIS — C3490 Malignant neoplasm of unspecified part of unspecified bronchus or lung: Secondary | ICD-10-CM

## 2017-09-14 DIAGNOSIS — M48061 Spinal stenosis, lumbar region without neurogenic claudication: Secondary | ICD-10-CM | POA: Diagnosis not present

## 2017-09-20 ENCOUNTER — Other Ambulatory Visit: Payer: Self-pay | Admitting: Internal Medicine

## 2017-09-20 DIAGNOSIS — G8929 Other chronic pain: Secondary | ICD-10-CM

## 2017-09-20 DIAGNOSIS — M545 Low back pain: Principal | ICD-10-CM

## 2017-09-21 ENCOUNTER — Telehealth: Payer: Self-pay

## 2017-09-21 NOTE — Telephone Encounter (Signed)
   Primary Cardiologist: Will Meredith Leeds, MD  Chart reviewed as part of pre-operative protocol coverage. Injection question - will route to pharm pool.  Charlie Pitter, PA-C 09/21/2017, 5:10 PM

## 2017-09-21 NOTE — Telephone Encounter (Signed)
   [facility] Pre-operative Risk Assessment    Request for surgical clearance:  1. What type of surgery is being performed? Lumbar ESI  2. When is this surgery scheduled? Pending response from clearance  3. Are there any medications that need to be held prior to surgery and how long? Eliquis - 2 days prior   4. Practice name and name of physician performing surgery?  [facility]   5. What is your office phone and fax number?  Phone: [phone] Fax: [phone]  6. Anesthesia type (None, local, MAC, general) ? N/A   Velna Ochs 09/21/2017, 4:14 PM  _________________________________________________________________   (provider comments below)

## 2017-09-24 NOTE — Telephone Encounter (Signed)
   Per pharmacy. Ok to hold Eliquis 3 days prior to injection. See notes below. We will notify pt and will also route clearance/ medication recommendations to [place] [facility] [place], PA-C 09/24/2017

## 2017-09-24 NOTE — Telephone Encounter (Signed)
Clearance faxed vs Epic to [facility] at [phone]

## 2017-09-24 NOTE — Telephone Encounter (Signed)
Called and made the patient's husband (DPR on file) aware that the patient has been cleared to hold Eliquis 3 days prior to injection and that we will fax the clearance to [facility]. Husband verbalized understanding and thanked me for the call.

## 2017-09-24 NOTE — Telephone Encounter (Signed)
Patient with diagnosis of Afib with history of PE on Eliquis for anticoagulation.    Procedure: Lumbar ESI Date of procedure: pending  CHADS2-VASc score of  4 (CHF, HTN, AGE, DM2, stroke/tia x 2, CAD, AGE, female)  CrCl 27ml/min  Per office protocol, patient can hold Eliquis for 3 days prior to procedure.

## 2017-10-04 ENCOUNTER — Ambulatory Visit
Admission: RE | Admit: 2017-10-04 | Discharge: 2017-10-04 | Disposition: A | Discharge status: Home / Self Care | Payer: Medicare Other | Source: Ambulatory Visit | Attending: Internal Medicine | Admitting: Internal Medicine

## 2017-10-04 ENCOUNTER — Other Ambulatory Visit: Payer: Self-pay | Admitting: Internal Medicine

## 2017-10-04 DIAGNOSIS — G8929 Other chronic pain: Secondary | ICD-10-CM

## 2017-10-04 DIAGNOSIS — M5126 Other intervertebral disc displacement, lumbar region: Secondary | ICD-10-CM | POA: Diagnosis not present

## 2017-10-04 DIAGNOSIS — M545 Low back pain: Principal | ICD-10-CM

## 2017-10-04 MED ORDER — METHYLPREDNISOLONE ACETATE 40 MG/ML INJ SUSP (RADIOLOG
120.0000 mg | Freq: Once | INTRAMUSCULAR | Status: AC
  Administered 2017-10-04: 120 mg via EPIDURAL

## 2017-10-04 MED ORDER — IOPAMIDOL (ISOVUE-M 200) INJECTION 41%
1.0000 mL | Freq: Once | INTRAMUSCULAR | Status: AC
  Administered 2017-10-04: 1 mL via EPIDURAL

## 2017-10-04 NOTE — Discharge Instructions (Signed)

## 2017-11-05 ENCOUNTER — Other Ambulatory Visit (HOSPITAL_COMMUNITY): Payer: Self-pay | Admitting: Nurse Practitioner

## 2017-11-06 ENCOUNTER — Telehealth: Payer: Self-pay | Admitting: *Deleted

## 2017-11-06 NOTE — Telephone Encounter (Signed)
CALLED PATIENT TO INFORM OF CT FOR 11-14-17 - ARRIVAL TIME - 11:45 AM @ [facility], NO RESTRICTIONS TO TEST DUE TO IT BEING ORDERED WITHOUT CONTRAST, PT. TO HAVE FU ON 11-15-17 @ 10:45 AM FOR RESULTS, SPOKE WITH PATIENT'S HUSBAND AND HE IS AWARE OF THESE APPTS.

## 2017-11-12 ENCOUNTER — Other Ambulatory Visit: Payer: Medicare Other

## 2017-11-14 ENCOUNTER — Ambulatory Visit (HOSPITAL_COMMUNITY)
Admission: RE | Admit: 2017-11-14 | Discharge: 2017-11-14 | Disposition: A | Discharge status: Home / Self Care | Payer: Medicare Other | Source: Ambulatory Visit | Attending: Radiation Oncology | Admitting: Radiation Oncology

## 2017-11-14 DIAGNOSIS — I700 Atherosclerosis of aorta: Secondary | ICD-10-CM | POA: Diagnosis not present

## 2017-11-14 DIAGNOSIS — C3411 Malignant neoplasm of upper lobe, right bronchus or lung: Secondary | ICD-10-CM | POA: Insufficient documentation

## 2017-11-14 DIAGNOSIS — C3490 Malignant neoplasm of unspecified part of unspecified bronchus or lung: Secondary | ICD-10-CM | POA: Diagnosis not present

## 2017-11-15 ENCOUNTER — Ambulatory Visit
Admission: RE | Admit: 2017-11-15 | Discharge: 2017-11-15 | Disposition: A | Discharge status: Home / Self Care | Payer: Medicare Other | Source: Ambulatory Visit | Attending: Radiation Oncology | Admitting: Radiation Oncology

## 2017-11-15 ENCOUNTER — Other Ambulatory Visit: Payer: Self-pay

## 2017-11-15 ENCOUNTER — Encounter: Payer: Self-pay | Admitting: Radiation Oncology

## 2017-11-15 VITALS — BP 137/64 | HR 81 | Temp 97.8°F | Resp 18 | Wt 134.4 lb

## 2017-11-15 DIAGNOSIS — Z08 Encounter for follow-up examination after completed treatment for malignant neoplasm: Secondary | ICD-10-CM | POA: Diagnosis not present

## 2017-11-15 DIAGNOSIS — C3411 Malignant neoplasm of upper lobe, right bronchus or lung: Secondary | ICD-10-CM

## 2017-11-15 DIAGNOSIS — D491 Neoplasm of unspecified behavior of respiratory system: Secondary | ICD-10-CM | POA: Diagnosis not present

## 2017-11-15 DIAGNOSIS — Z85118 Personal history of other malignant neoplasm of bronchus and lung: Secondary | ICD-10-CM | POA: Diagnosis not present

## 2017-11-15 DIAGNOSIS — R531 Weakness: Secondary | ICD-10-CM | POA: Diagnosis not present

## 2017-11-15 NOTE — Progress Notes (Signed)
Patient is here for a follow-up appointment. Denies any pain. States that she has a lot of weakness and fatigue. States that she gets shortness of breath with activity. Denies any difficulty with swallowing. Denies any cough. States that her appetite is ok. Denies any skin issues. Vitals:   11/15/17 1050  BP: 137/64  Pulse: 81  Resp: 18  Temp: 97.8 F (36.6 C)  SpO2: 98%  Weight: 124 lb 6 oz (56.4 kg)   Wt Readings from Last 3 Encounters:  11/15/17 134 lb 6 oz (61 kg)  05/17/17 131 lb 3.2 oz (59.5 kg)  05/02/17 130 lb (59 kg)

## 2017-11-16 ENCOUNTER — Other Ambulatory Visit: Payer: Self-pay | Admitting: Radiation Oncology

## 2017-11-16 DIAGNOSIS — C3411 Malignant neoplasm of upper lobe, right bronchus or lung: Secondary | ICD-10-CM

## 2017-11-16 NOTE — Progress Notes (Addendum)
Radiation Oncology         [phone] ________________________________  Name: TIFFANI KADOW MRN: [id number]  Date: 11/15/2017  DOB: 12-31-1925  Follow-Up Visit Note  CC: Crist Infante, MD  Melrose Nakayama, *    ICD-10-CM   1. Primary cancer of right upper lobe of lung (HCC) C34.11     Diagnosis:   84 y.o. woman with Stage IB (T2a, N0, M0) non-small cell lung cancer, adenocarcinoma in the right upper lobe in addition to putative stage IA lesion in the left upper lobe diagnosed in February 2018.  Interval Since Last Radiation:  12  months  10/31/2016 - 11/13/2016 SBRT Style Radiotherapy: Left upper lung treated to 54 Gy at 3 fractions. Right upper lung to 50 Gy at 10 fractions.   Narrative:  Braley Luckenbaugh is a pleasant 84 y.o. female who was seen originally in the early months of 2018 after being followed for a RUL pulmonary nodule. Imaging also revealed a LUL nodule and she completed work up with a biopsy following PET studies. Both sites were suspicious and her RUL biopsy revealed NSCLC, adenocarcinoma. She was treated with fractionated SBRT style radiotherapy and continues in surveillance. She underwent a CT chest on 11/14/17 revealing the RUL measuring similar in comparison to her December 2018 scan at 2 x 2.3 cm. The LUL was 11 x 13 mm, one mm smaller than her last.   On review of systems, the patient reports that she is doing well overall. She describes fatigue and low energy. She avoids bending over when standing or sitting due to known dizziness and orthostasis. She denies any chest pain, shortness of breath currently though she notes this with exertion, cough, fevers, chills, night sweats, unintended weight changes. She denies any bowel or bladder disturbances, and denies abdominal pain, nausea or vomiting. She  denies any new musculoskeletal or joint aches or pains, new skin lesions or concerns. A complete review of systems is obtained and is otherwise negative.  Past  Medical History:  Past Medical History:  Diagnosis Date  . Allergic rhinitis   . Arthritis    "the normal for my age" (10/03/2016)  . Asthma    "very mild" (10/03/2016)  . Atrial fibrillation ([place])   . Chronic lower back pain   . Dyspnea on exertion   . History of blood transfusion    "I was young; don't remember why I needed it"  . History of external beam radiation therapy 10/31/16-11/13/16   left upper lung treated to 54 Gy in 3 fractions, right upper lung to 50 Gy in 10 fractions  . 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 On home oxygen therapy    "available; not needed it yet" (10/03/2016)  . Osteopenia   . Other and unspecified hyperlipidemia   . Other retinal disorders(362.89)   . Personal history of other diseases of circulatory system   . Personal history of venous thrombosis and embolism 4/94  . Reactive airway disease   . Subjective visual disturbance, unspecified   . Vitamin D deficiency     Past Surgical History: Past Surgical History:  Procedure Laterality Date  . APPENDECTOMY    . BREAST BIOPSY Bilateral   . CARDIOVASCULAR STRESS TEST  August 2013   CPET-MET: Submaximal effort, did not reach anaerobic threshold --> peak VO2 76% (not overly concerning for CAD)  . CARDIOVERSION N/A 08/29/2016   Procedure: CARDIOVERSION;  Surgeon: Sanda Klein, MD;  Location: [facility];  Service: Cardiovascular;  Laterality: N/A;  . CARPAL TUNNEL RELEASE Bilateral 2006  . GLAUCOMA SURGERY Bilateral   . PERINEOPLASTY     and rectocele  . Pulmonary Function Test  August 2013   CPET-PFTs: FVC 58%, FEV1 48%; breathing reserve less than 10%, normal DLCO; reduced vital capacity -- suggesting obstructive lung disease  . SHOULDER ARTHROSCOPY W/ ROTATOR CUFF REPAIR  7/04, 2003; ?date   right, "left side was done a 2nd time"  . TEE WITHOUT CARDIOVERSION N/A 08/29/2016   Procedure: TRANSESOPHAGEAL ECHOCARDIOGRAM (TEE);  Surgeon: Sanda Klein, MD;  Location: [facility] ENDOSCOPY;  Service: Cardiovascular;  Laterality: N/A;  . TONSILLECTOMY    . TOTAL ABDOMINAL HYSTERECTOMY    . TRANSTHORACIC ECHOCARDIOGRAM  February 2011   EF 55-60%. Grade 1 diastolic dysfunction/relaxation abnormality. Mild MR    Social History:  Social History   Socioeconomic History  . Marital status: Married    Spouse name: Not on file  . Number of children: Not on file  . Years of education: Not on file  . Highest education level: Not on file  Occupational History  . 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 Financial resource strain: Not on file  . Food insecurity:    Worry: Not on file    Inability: Not on file  . Transportation needs:    Medical: Not on file    Non-medical: Not on file  Tobacco Use  . Smoking status: Former Smoker    Packs/day: 0.75    Years: 25.00    Pack years: 18.75    Types: Cigarettes    Last attempt to quit: 1969    Years since quitting: 50.2  . Smokeless tobacco: Never Used  . Tobacco comment: 3/4 ppd 1946-1969 (17.5 pack years)  Substance and Sexual Activity  . Alcohol use: Yes    Alcohol/week: 4.2 oz    Types: 3 Glasses of wine, 4 Shots of liquor per week    Comment: 10/03/2016 "high ball or glass of wine prior to dinner"  . Drug use: No  . Sexual activity: Never    Partners: Male  Lifestyle  . Physical activity:    Days per week: Not on file    Minutes per session: Not on file  . Stress: Not on file  Relationships  . Social connections:    Talks on phone: Not on file    Gets together: Not on file    Attends religious service: Not on file    Active member of club or organization: Not on file    Attends meetings of clubs or organizations: Not on file    Relationship status: Not on file  . Intimate partner violence:    Fear of current or ex partner: Not on file    Emotionally abused: Not on file    Physically abused: Not on file    Forced sexual activity: Not on file  Other  Topics Concern  . Not on file  Social History Narrative   She is here with her husband - a retired Lexicographer.    They have 2 children, 3 grandchildren.    She does not smoke (never smoked), and does not drink.    She does not really get a lot of exercise besides what I mentioned. She does walk with   her husband but no additional exercise beyond that, but which is still relatively adequate.    The patient is a retired Writer.  Family History: Family History  Problem Relation Age of Onset  . Osteoporosis Mother   . Diabetes Paternal Aunt      ALLERGIES:  is allergic to cefaclor and codeine.  Meds: Current Outpatient Medications  Medication Sig Dispense Refill  . acetaminophen (TYLENOL) 325 MG tablet Take 650 mg by mouth every 6 (six) hours as needed for mild pain.     Marland Kitchen amiodarone (PACERONE) 200 MG tablet TAKE 1/2 TABLET BY MOUTH DAILY 15 tablet 0  . apixaban (ELIQUIS) 2.5 MG TABS tablet Take 1 tablet (2.5 mg total) by mouth 2 (two) times daily. 60 tablet 0  . Cholecalciferol (VITAMIN D3) 5000 units TABS Take 2 tablets by mouth daily.    . Coenzyme Q10 (COQ10) 200 MG CAPS Take 1 capsule by mouth daily.     . cyanocobalamin 1000 MCG tablet Take 2,000 mcg by mouth daily.    Marland Kitchen diltiazem (CARDIZEM SR) 60 MG 12 hr capsule Take 60 mg by mouth daily.     . dorzolamide-timolol (COSOPT) 22.3-6.8 MG/ML ophthalmic solution Place 1 drop into both eyes 2 (two) times daily.      . fluocinonide (LIDEX) 0.05 % external solution Apply 1 application topically once a week. scalp  1  . ipratropium (ATROVENT) 0.03 % nasal spray 1-2 puffs each nostril every 8 hours if needed 30 mL 12  . loratadine (CLARITIN) 10 MG tablet Take 10 mg by mouth daily as needed for allergies.     . Multiple Vitamins-Minerals (PRESERVISION/LUTEIN) CAPS Take 1 capsule by mouth 2 (two) times daily.      . SYMBICORT 160-4.5 MCG/ACT inhaler inhale 2 puffs by mouth twice a day Rinse mouth after use 10.2 g 0  . OXYGEN  2LPM     . PROAIR HFA 108 (90 Base) MCG/ACT inhaler inhale 2 puffs by mouth every 6 hours if needed for wheezing or shortness of breath  0   No current facility-administered medications for this encounter.     Physical Findings: Wt Readings from Last 3 Encounters:  11/15/17 134 lb 6 oz (61 kg)  05/17/17 131 lb 3.2 oz (59.5 kg)  05/02/17 130 lb (59 kg)   Temp Readings from Last 3 Encounters:  11/15/17 97.8 F (36.6 C)  06/14/17 97.9 F (36.6 C) (Oral)  05/17/17 97.8 F (36.6 C) (Oral)   BP Readings from Last 3 Encounters:  11/15/17 137/64  10/04/17 (!) 160/75  06/14/17 128/86   Pulse Readings from Last 3 Encounters:  11/15/17 81  10/04/17 85  06/14/17 81  In general this is a well appearing elderly caucasian female in no acute distress. She is alert and oriented x4 and appropriate throughout the examination. HEENT reveals that the patient is normocephalic, atraumatic. EOMs are intact. Skin is intact without any evidence of gross lesions. Cardiovascular exam reveals a regular rate and rhythm, no clicks rubs or murmurs are auscultated. Chest is clear to auscultation bilaterally. Lymphatic assessment is performed and does not reveal any adenopathy in the cervical, supraclavicular, axillary, or inguinal chains. Abdomen has active bowel sounds in all quadrants and is intact. The abdomen is soft, non tender, non distended. Lower extremities are negative for pretibial pitting edema.    Lab Findings: Lab Results  Component Value Date   WBC 7.4 10/04/2016   HGB 12.4 10/04/2016   HCT 39.6 10/04/2016   MCV 95.9 10/04/2016   PLT 228 10/04/2016    Radiographic Findings: Ct Chest Wo Contrast  Result Date: 11/15/2017 CLINICAL DATA:  Non-small-cell lung cancer.  EXAM: CT CHEST WITHOUT CONTRAST TECHNIQUE: Multidetector CT imaging of the chest was performed following the standard protocol without IV contrast. COMPARISON:  08/16/2017 FINDINGS: Cardiovascular: The heart size is normal. No  pericardial effusion. Coronary artery calcification is evident. Atherosclerotic calcification is noted in the wall of the thoracic aorta. Mediastinum/Nodes: No mediastinal lymphadenopathy. No evidence for gross hilar lymphadenopathy although assessment is limited by the lack of intravenous contrast on today's study. The esophagus has normal imaging features. There is no axillary lymphadenopathy. Lungs/Pleura: Bilateral areas of architectural distortion and confluent masslike opacity in the upper lobes are similar to prior. Nodular component in the left apex measured previously at 12 x 13 mm measures 11 x 13 mm on image 21/series 7 today. Pleuroparenchymal opacity in the right apex is also similar with nodular component measuring 2.0 x 2.3 cm today, unchanged in the interval. 4 mm right lower lobe pulmonary nodule identified on the previous study is almost imperceptible on today's exam (image 84/7). As before, there are numerous tiny pulmonary nodules scattered through both lungs without substantial change. No progressive or new pulmonary abnormality identified on today's exam. Upper Abdomen: Calcified granulomata noted in the liver and spleen. Musculoskeletal: Stable compression deformity at T10. IMPRESSION: 1. Stable exam without new or progressive interval findings. Specifically, bilateral upper lobe lesions show no appreciable change. 2.  Aortic Atherosclerois (ICD10-170.0) Electronically Signed   By: Misty Stanley M.D.   On: 11/15/2017 07:16    Impression/Plan:  1. Stage IB (T2a, N0, M0) non-small cell lung cancer, adenocarcinoma in the right upper lobe in addition to putative stage IA lesion in the left upper lobe diagnosed in February 2018. The patient appears to be clinically and radiographicallly doing well. No concerns for progressive disease are noted and per NCCN guidelines, we would plan her next CT scan in 6 month's time,  however the patient and her husband would like to proceed with a scan in  approximately 4 months. If this continues to be stable then scanning frequency will go to 6 months or sooner if symptoms indicated this. She will keep [place] informed of any questions or concerns and we will see her back to review her next scan. 2. Weakness and fatigue. She was encouraged to discuss this as well with her cardiologist and PCP. No focal findings today suggest a source of her symptoms, but I encouraged her to also consider some low impact exercise. We will follow this expectantly.  The above documentation reflects our direct findings during this shared patient visit.     Carola Rhine, PAC  -----------------------------------  Blair Promise, PhD, MD

## 2017-11-26 ENCOUNTER — Encounter: Payer: Self-pay | Admitting: Cardiology

## 2017-11-26 ENCOUNTER — Ambulatory Visit (INDEPENDENT_AMBULATORY_CARE_PROVIDER_SITE_OTHER): Payer: Medicare Other | Admitting: Cardiology

## 2017-11-26 VITALS — BP 156/80 | HR 89 | Wt 137.0 lb

## 2017-11-26 DIAGNOSIS — I481 Persistent atrial fibrillation: Secondary | ICD-10-CM

## 2017-11-26 DIAGNOSIS — I4819 Other persistent atrial fibrillation: Secondary | ICD-10-CM

## 2017-11-26 DIAGNOSIS — E785 Hyperlipidemia, unspecified: Secondary | ICD-10-CM

## 2017-11-26 DIAGNOSIS — I10 Essential (primary) hypertension: Secondary | ICD-10-CM | POA: Diagnosis not present

## 2017-11-26 NOTE — Patient Instructions (Addendum)
Medication Instructions:  Your physician has recommended you make the following change in your medication:  1. STOP Diltiazem  Labwork: None ordered  Testing/Procedures: None ordered  Follow-Up: Your physician wants you to follow-up in: 6 months with Dr. Curt Bears.  You will receive a reminder letter in the mail two months in advance. If you don't receive a letter, please call our office to schedule the follow-up appointment.  * If you need a refill on your cardiac medications before your next appointment, please call your pharmacy.   *Please note that any paperwork needing to be filled out by the provider will need to be addressed at the front desk prior to seeing the provider. Please note that any FMLA, disability or other documents regarding health condition is subject to a $25.00 charge that must be received prior to completion of paperwork in the form of a money order or check.  Thank you for choosing [facility]!!   Trinidad Curet, RN [phone]

## 2017-11-26 NOTE — Progress Notes (Signed)
Electrophysiology Office Note   Date:  11/26/2017   ID:  Jodi Burns, DOB [birth year], MRN [id number]  PCP:  Jodi Infante, MD  Cardiologist:  Primary Electrophysiologist:  Jodi Kham Meredith Leeds, MD    Chief Complaint  Patient presents with  . Follow-up    Persistent Afib     History of Present Illness: Jodi Burns is a 84 y.o. female who is being seen today for the evaluation of atrial fibrillation at the request of Jodi Infante, MD. Presenting today for electrophysiology evaluation. She was hospitalized in January with atrial fibrillation and a URI. Her atrial fibrillation was complicated by hypotension this making rate control difficult. She had an urgent cardioversion and was loaded on amiodarone. She was found to have lung cancer and has since undergone radiation therapy.  Today, denies symptoms of palpitations, chest pain, shortness of breath, orthopnea, PND, lower extremity edema, claudication, dizziness, presyncope, syncope, bleeding, or neurologic sequela. The patient is tolerating medications without difficulties.  She has been having episodes of fatigue and weakness.  These have been occurring for the last few months.  Her primary physician is currently working her up for this.  She has been trying to do daily exercise, though her fatigue and weakness is quite limiting.   Past Medical History:  Diagnosis Date  . Allergic rhinitis   . Arthritis    "the normal for my age" (10/03/2016)  . Asthma    "very mild" (10/03/2016)  . Atrial fibrillation ([place])   . Chronic lower back pain   . Dyspnea on exertion   . History of blood transfusion    "I was young; don't remember why I needed it"  . History of external beam radiation therapy 10/31/16-11/13/16   left upper lung treated to 54 Gy in 3 fractions, right upper lung to 50 Gy in 10 fractions  . 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 On home oxygen therapy    "available; not needed it yet" (10/03/2016)  . Osteopenia   . Other and unspecified hyperlipidemia   . Other retinal disorders(362.89)   . Personal history of other diseases of circulatory system   . Personal history of venous thrombosis and embolism 4/94  . Reactive airway disease   . Subjective visual disturbance, unspecified   . Vitamin D deficiency    Past Surgical History:  Procedure Laterality Date  . APPENDECTOMY    . BREAST BIOPSY Bilateral   . CARDIOVASCULAR STRESS TEST  August 2013   CPET-MET: Submaximal effort, did not reach anaerobic threshold --> peak VO2 76% (not overly concerning for CAD)  . CARDIOVERSION N/A 08/29/2016   Procedure: CARDIOVERSION;  Surgeon: Sanda Klein, MD;  Location: [facility];  Service: Cardiovascular;  Laterality: N/A;  . CARPAL TUNNEL RELEASE Bilateral 2006  . GLAUCOMA SURGERY Bilateral   . PERINEOPLASTY     and rectocele  . Pulmonary Function Test  August 2013   CPET-PFTs: FVC 58%, FEV1 48%; breathing reserve less than 10%, normal DLCO; reduced vital capacity -- suggesting obstructive lung disease  . SHOULDER ARTHROSCOPY W/ ROTATOR CUFF REPAIR  7/04, 2003; ?date   right, "left side was done a 2nd time"  . TEE WITHOUT CARDIOVERSION N/A 08/29/2016   Procedure: TRANSESOPHAGEAL ECHOCARDIOGRAM (TEE);  Surgeon: Sanda Klein, MD;  Location: [facility] ENDOSCOPY;  Service: Cardiovascular;  Laterality: N/A;  . TONSILLECTOMY    . TOTAL ABDOMINAL HYSTERECTOMY    . TRANSTHORACIC ECHOCARDIOGRAM  February 2011  EF 55-60%. Grade 1 diastolic dysfunction/relaxation abnormality. Mild MR     Current Outpatient Medications  Medication Sig Dispense Refill  . acetaminophen (TYLENOL) 325 MG tablet Take 650 mg by mouth every 6 (six) hours as needed for mild pain.     Marland Kitchen amiodarone (PACERONE) 200 MG tablet TAKE 1/2 TABLET BY MOUTH DAILY 15 tablet 0  . apixaban (ELIQUIS) 2.5 MG TABS tablet Take 1 tablet (2.5 mg total) by mouth 2 (two) times daily. 60 tablet 0  .  Cholecalciferol (VITAMIN D3) 5000 units TABS Take 2 tablets by mouth daily.    . Coenzyme Q10 (COQ10) 200 MG CAPS Take 1 capsule by mouth daily.     . cyanocobalamin 1000 MCG tablet Take 2,000 mcg by mouth daily.    Marland Kitchen diltiazem (CARDIZEM SR) 60 MG 12 hr capsule Take 60 mg by mouth daily.     . dorzolamide-timolol (COSOPT) 22.3-6.8 MG/ML ophthalmic solution Place 1 drop into both eyes 2 (two) times daily.      . fluocinonide (LIDEX) 0.05 % external solution Apply 1 application topically once a week. scalp  1  . ipratropium (ATROVENT) 0.03 % nasal spray 1-2 puffs each nostril every 8 hours if needed 30 mL 12  . loratadine (CLARITIN) 10 MG tablet Take 10 mg by mouth daily as needed for allergies.     . Multiple Vitamins-Minerals (PRESERVISION/LUTEIN) CAPS Take 1 capsule by mouth 2 (two) times daily.      . OXYGEN 2LPM     . PROAIR HFA 108 (90 Base) MCG/ACT inhaler inhale 2 puffs by mouth every 6 hours if needed for wheezing or shortness of breath  0  . SYMBICORT 160-4.5 MCG/ACT inhaler inhale 2 puffs by mouth twice a day Rinse mouth after use 10.2 g 0   No current facility-administered medications for this visit.     Allergies:   Cefaclor and Codeine   Social History:  The patient  reports that she quit smoking about 50 years ago. Her smoking use included cigarettes. She has a 18.75 pack-year smoking history. She has never used smokeless tobacco. She reports that she drinks about 4.2 oz of alcohol per week. She reports that she does not use drugs.   Family History:  The patient's family history includes Diabetes in her paternal aunt; Osteoporosis in her mother.    ROS:  Please see the history of present illness.   Otherwise, review of systems is positive for fatigue.   All other systems are reviewed and negative.   PHYSICAL EXAM: VS:  BP (!) 156/80   Pulse 89   Wt 137 lb (62.1 kg)   LMP 08/29/1967   BMI 24.66 kg/m  , BMI Body mass index is 24.66 kg/m. GEN: Well nourished, well  developed, in no acute distress  HEENT: normal  Neck: no JVD, carotid bruits, or masses Cardiac: RRR; no murmurs, rubs, or gallops,no edema  Respiratory:  clear to auscultation bilaterally, normal work of breathing GI: soft, nontender, nondistended, + BS MS: no deformity or atrophy  Skin: warm and dry Neuro:  Strength and sensation are intact Psych: euthymic mood, full affect  EKG:  EKG is ordered today. Personal review of the ekg ordered shows sinus rhythm, rate 89, LVH with repo abnormalities, left axis deviation, inferior infarct  Recent Labs: No results found for requested labs within last 8760 hours.    Lipid Panel  No results found for: CHOL, TRIG, HDL, CHOLHDL, VLDL, LDLCALC, LDLDIRECT   Wt Readings from Last 3 Encounters:  11/26/17 137 lb (62.1 kg)  11/15/17 134 lb 6 oz (61 kg)  05/17/17 131 lb 3.2 oz (59.5 kg)      Other studies Reviewed: Additional studies/ records that were reviewed today include: TEE 08/29/16  Review of the above records today demonstrates:  - Left ventricle: The cavity size was normal. Wall thickness was   normal. Systolic function was normal. The estimated ejection   fraction was in the range of 60% to 65%. Wall motion was normal;   there were no regional wall motion abnormalities. No evidence of   thrombus. - Aortic valve: No evidence of vegetation. No evidence of   vegetation. - Mitral valve: No evidence of vegetation. - Left atrium: No evidence of thrombus in the atrial cavity or   appendage. - Right atrium: No evidence of thrombus in the atrial cavity or   appendage. - Atrial septum: No defect or patent foramen ovale was identified. - Tricuspid valve: Mild prolapse. No evidence of vegetation. There   was mild-moderate regurgitation directed centrally. - Pulmonic valve: No evidence of vegetation.   ASSESSMENT AND PLAN:  1.  Persistent atrial fibrillation: Amiodarone and Eliquis.  Amiodarone was decreased to 100 mg by the atrial  fibrillation clinic.  She remains in sinus rhythm today.  She does complain of weakness and fatigue.  We Shawneequa Baldridge thus stop her diltiazem.    This patients CHA2DS2-VASc Score and unadjusted Ischemic Stroke Rate (% per year) is equal to 4.8 % stroke rate/year from a score of 4  Above score calculated as 1 point each if present [CHF, HTN, DM, Vascular=MI/PAD/Aortic Plaque, Age if 65-74, or Female] Above score calculated as 2 points each if present [Age > 75, or Stroke/TIA/TE]   2. Hyperlipidemia: Has been taken off of simvastatin by her primary physician  3. Hypertension: Blood pressure is elevated today, but has been more normal in the past.  No changes at this time.  Current medicines are reviewed at length with the patient today.   The patient does not have concerns regarding her medicines.  The following changes were made today: Stop diltiazem  Labs/ tests ordered today include:  Orders Placed This Encounter  Procedures  . EKG 12-Lead     Disposition:   FU with Delainey Winstanley 6 months  Signed, Umeka Wrench Meredith Leeds, MD  11/26/2017 12:35 PM     [address] [place]  [phone] (office) [phone] (fax)

## 2017-11-27 ENCOUNTER — Encounter: Payer: Self-pay | Admitting: Cardiology

## 2017-12-05 DIAGNOSIS — I480 Paroxysmal atrial fibrillation: Secondary | ICD-10-CM | POA: Diagnosis not present

## 2017-12-05 DIAGNOSIS — D519 Vitamin B12 deficiency anemia, unspecified: Secondary | ICD-10-CM | POA: Diagnosis not present

## 2017-12-05 DIAGNOSIS — C3490 Malignant neoplasm of unspecified part of unspecified bronchus or lung: Secondary | ICD-10-CM | POA: Diagnosis not present

## 2017-12-05 DIAGNOSIS — F329 Major depressive disorder, single episode, unspecified: Secondary | ICD-10-CM | POA: Diagnosis not present

## 2017-12-05 DIAGNOSIS — M5431 Sciatica, right side: Secondary | ICD-10-CM | POA: Diagnosis not present

## 2017-12-05 DIAGNOSIS — Z6824 Body mass index (BMI) 24.0-24.9, adult: Secondary | ICD-10-CM | POA: Diagnosis not present

## 2017-12-06 ENCOUNTER — Other Ambulatory Visit (HOSPITAL_COMMUNITY): Payer: Self-pay | Admitting: Cardiology

## 2018-01-11 ENCOUNTER — Ambulatory Visit (INDEPENDENT_AMBULATORY_CARE_PROVIDER_SITE_OTHER): Payer: Medicare Other | Admitting: Podiatry

## 2018-01-11 ENCOUNTER — Encounter: Payer: Self-pay | Admitting: Podiatry

## 2018-01-11 DIAGNOSIS — D689 Coagulation defect, unspecified: Secondary | ICD-10-CM | POA: Diagnosis not present

## 2018-01-11 DIAGNOSIS — M79675 Pain in left toe(s): Secondary | ICD-10-CM

## 2018-01-11 DIAGNOSIS — B351 Tinea unguium: Secondary | ICD-10-CM

## 2018-01-11 DIAGNOSIS — M79674 Pain in right toe(s): Secondary | ICD-10-CM | POA: Diagnosis not present

## 2018-01-14 NOTE — Progress Notes (Signed)
Subjective:   Patient ID: Jodi Burns, female   DOB: 84 y.o.   MRN: [id number]   HPI Patient presents with elongated nailbeds 1-5 both feet that are thick yellow brittle painful and hard for her to cut   ROS      Objective:  Physical Exam  Mycotic nail infection 1-5 both feet with pain     Assessment:  Thick yellow brittle nailbeds 1-5 both feet that are painful     Plan:  Debrided mycotic nail infection 1-5 both feet with no iatrogenic bleeding noted

## 2018-01-24 DIAGNOSIS — H401132 Primary open-angle glaucoma, bilateral, moderate stage: Secondary | ICD-10-CM | POA: Diagnosis not present

## 2018-01-24 DIAGNOSIS — H04123 Dry eye syndrome of bilateral lacrimal glands: Secondary | ICD-10-CM | POA: Diagnosis not present

## 2018-01-24 DIAGNOSIS — H353134 Nonexudative age-related macular degeneration, bilateral, advanced atrophic with subfoveal involvement: Secondary | ICD-10-CM | POA: Diagnosis not present

## 2018-01-24 DIAGNOSIS — Z961 Presence of intraocular lens: Secondary | ICD-10-CM | POA: Diagnosis not present

## 2018-03-15 DIAGNOSIS — E7849 Other hyperlipidemia: Secondary | ICD-10-CM | POA: Diagnosis not present

## 2018-03-15 DIAGNOSIS — M859 Disorder of bone density and structure, unspecified: Secondary | ICD-10-CM | POA: Diagnosis not present

## 2018-03-15 DIAGNOSIS — E559 Vitamin D deficiency, unspecified: Secondary | ICD-10-CM | POA: Diagnosis not present

## 2018-03-15 DIAGNOSIS — D518 Other vitamin B12 deficiency anemias: Secondary | ICD-10-CM | POA: Diagnosis not present

## 2018-03-15 DIAGNOSIS — D519 Vitamin B12 deficiency anemia, unspecified: Secondary | ICD-10-CM | POA: Diagnosis not present

## 2018-03-15 DIAGNOSIS — R82998 Other abnormal findings in urine: Secondary | ICD-10-CM | POA: Diagnosis not present

## 2018-03-18 ENCOUNTER — Ambulatory Visit (HOSPITAL_COMMUNITY)
Admission: RE | Admit: 2018-03-18 | Discharge: 2018-03-18 | Disposition: A | Discharge status: Home / Self Care | Payer: Medicare Other | Source: Ambulatory Visit | Attending: Radiation Oncology | Admitting: Radiation Oncology

## 2018-03-18 ENCOUNTER — Ambulatory Visit
Admission: RE | Admit: 2018-03-18 | Discharge: 2018-03-18 | Disposition: A | Discharge status: Home / Self Care | Payer: Medicare Other | Source: Ambulatory Visit | Attending: Radiation Oncology | Admitting: Radiation Oncology

## 2018-03-18 ENCOUNTER — Encounter (HOSPITAL_COMMUNITY): Payer: Self-pay

## 2018-03-18 DIAGNOSIS — I2510 Atherosclerotic heart disease of native coronary artery without angina pectoris: Secondary | ICD-10-CM | POA: Diagnosis not present

## 2018-03-18 DIAGNOSIS — C3411 Malignant neoplasm of upper lobe, right bronchus or lung: Secondary | ICD-10-CM | POA: Diagnosis not present

## 2018-03-18 DIAGNOSIS — I700 Atherosclerosis of aorta: Secondary | ICD-10-CM | POA: Insufficient documentation

## 2018-03-18 DIAGNOSIS — C3412 Malignant neoplasm of upper lobe, left bronchus or lung: Secondary | ICD-10-CM | POA: Diagnosis not present

## 2018-03-20 ENCOUNTER — Ambulatory Visit
Admission: RE | Admit: 2018-03-20 | Discharge: 2018-03-20 | Disposition: A | Discharge status: Home / Self Care | Payer: Medicare Other | Source: Ambulatory Visit | Attending: Radiation Oncology | Admitting: Radiation Oncology

## 2018-03-20 ENCOUNTER — Encounter: Payer: Self-pay | Admitting: Radiation Oncology

## 2018-03-20 VITALS — BP 151/63 | HR 93 | Temp 97.6°F | Resp 18 | Ht 62.5 in | Wt 139.8 lb

## 2018-03-20 DIAGNOSIS — R0602 Shortness of breath: Secondary | ICD-10-CM | POA: Diagnosis not present

## 2018-03-20 DIAGNOSIS — E559 Vitamin D deficiency, unspecified: Secondary | ICD-10-CM | POA: Diagnosis not present

## 2018-03-20 DIAGNOSIS — Z79899 Other long term (current) drug therapy: Secondary | ICD-10-CM | POA: Insufficient documentation

## 2018-03-20 DIAGNOSIS — R5383 Other fatigue: Secondary | ICD-10-CM | POA: Diagnosis not present

## 2018-03-20 DIAGNOSIS — C3412 Malignant neoplasm of upper lobe, left bronchus or lung: Secondary | ICD-10-CM | POA: Diagnosis not present

## 2018-03-20 DIAGNOSIS — Z86718 Personal history of other venous thrombosis and embolism: Secondary | ICD-10-CM | POA: Insufficient documentation

## 2018-03-20 DIAGNOSIS — C3411 Malignant neoplasm of upper lobe, right bronchus or lung: Secondary | ICD-10-CM | POA: Insufficient documentation

## 2018-03-20 DIAGNOSIS — Z881 Allergy status to other antibiotic agents status: Secondary | ICD-10-CM | POA: Diagnosis not present

## 2018-03-20 DIAGNOSIS — E785 Hyperlipidemia, unspecified: Secondary | ICD-10-CM | POA: Insufficient documentation

## 2018-03-20 DIAGNOSIS — Z8679 Personal history of other diseases of the circulatory system: Secondary | ICD-10-CM | POA: Insufficient documentation

## 2018-03-20 DIAGNOSIS — Z7901 Long term (current) use of anticoagulants: Secondary | ICD-10-CM | POA: Insufficient documentation

## 2018-03-20 DIAGNOSIS — R918 Other nonspecific abnormal finding of lung field: Secondary | ICD-10-CM

## 2018-03-20 DIAGNOSIS — Z7951 Long term (current) use of inhaled steroids: Secondary | ICD-10-CM | POA: Insufficient documentation

## 2018-03-20 DIAGNOSIS — Z9981 Dependence on supplemental oxygen: Secondary | ICD-10-CM | POA: Diagnosis not present

## 2018-03-20 DIAGNOSIS — Z885 Allergy status to narcotic agent status: Secondary | ICD-10-CM | POA: Diagnosis not present

## 2018-03-20 DIAGNOSIS — Z87891 Personal history of nicotine dependence: Secondary | ICD-10-CM | POA: Diagnosis not present

## 2018-03-20 DIAGNOSIS — Z08 Encounter for follow-up examination after completed treatment for malignant neoplasm: Secondary | ICD-10-CM | POA: Diagnosis not present

## 2018-03-20 DIAGNOSIS — I4891 Unspecified atrial fibrillation: Secondary | ICD-10-CM | POA: Diagnosis not present

## 2018-03-20 DIAGNOSIS — R0609 Other forms of dyspnea: Secondary | ICD-10-CM | POA: Insufficient documentation

## 2018-03-20 DIAGNOSIS — J45909 Unspecified asthma, uncomplicated: Secondary | ICD-10-CM | POA: Diagnosis not present

## 2018-03-20 NOTE — Progress Notes (Addendum)
Radiation Oncology         [phone]) [phone] ________________________________  Name: Jodi Burns MRN: [id number]  Date: 03/20/2018  DOB: [birth year]  Follow-Up Visit Note  CC: Crist Infante, MD  Melrose Nakayama, *  Diagnosis:   84 y.o. woman with Stage IB (T2a, N0, M0) non-small cell lung cancer, adenocarcinoma in the right upper lobe in addition to putative stage IA lesion in the left upper lobe diagnosed in February 2018.  Interval Since Last Radiation:  1 year, 4 months  10/31/2016 - 11/13/2016 SBRT Style Radiotherapy: Left upper lung treated to 54 Gy at 3 fractions. Right upper lung to 50 Gy at 10 fractions.   Narrative:  Jodi Burns is a pleasant 84 y.o. female who was seen originally in the early months of 2018 after being followed for a RUL pulmonary nodule. Imaging also revealed a LUL nodule and she completed work up with a biopsy following PET studies. Both sites were suspicious and her RUL biopsy revealed NSCLC, adenocarcinoma. She was treated with fractionated SBRT style radiotherapy and continues in surveillance. Her most recent CT on 03/18/18 revealed concerns for possible enlargement of her bilateral upper lobe nodules with mass like enhancement on right upper lobe measuring 2.7 x 2.4 cm (2.3 x 2 cm in March 2019), and 2 x 1.5 cm (1.3 x 1.1 cm in March 2019). She comes today to review these results.   On review of systems, the patient reports that she is doing well overall. She continues to have low energy and fatigue though this appears to be stable. She denies any chest pain, shortness of breath at rest but does have this at home with exertion. At these times, she does not have chest pain. She does not have any tachycardia or tachypneia per her husband. She denies cough, fevers, chills, night sweats, unintended weight changes. She denies any bowel or bladder disturbances, and denies abdominal pain, nausea or vomiting. She denies any new musculoskeletal or joint aches or  pains, new skin lesions or concerns. A complete review of systems is obtained and is otherwise negative.   Past Medical History:  Past Medical History:  Diagnosis Date  . Allergic rhinitis   . Arthritis    "the normal for my age" (10/03/2016)  . Asthma    "very mild" (10/03/2016)  . Atrial fibrillation ([place])   . Chronic lower back pain   . Dyspnea on exertion   . History of blood transfusion    "I was young; don't remember why I needed it"  . History of external beam radiation therapy 10/31/16-11/13/16   left upper lung treated to 54 Gy in 3 fractions, right upper lung to 50 Gy in 10 fractions  . 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 On home oxygen therapy    "available; not needed it yet" (10/03/2016)  . Osteopenia   . Other and unspecified hyperlipidemia   . Other retinal disorders(362.89)   . Personal history of other diseases of circulatory system   . Personal history of venous thrombosis and embolism 4/94  . Reactive airway disease   . Subjective visual disturbance, unspecified   . Vitamin D deficiency     Past Surgical History: Past Surgical History:  Procedure Laterality Date  . APPENDECTOMY    . BREAST BIOPSY Bilateral   . CARDIOVASCULAR STRESS TEST  August 2013   CPET-MET: Submaximal effort, did not reach anaerobic threshold --> peak VO2 76% (  not overly concerning for CAD)  . CARDIOVERSION N/A 08/29/2016   Procedure: CARDIOVERSION;  Surgeon: Sanda Klein, MD;  Location: [place];  Service: Cardiovascular;  Laterality: N/A;  . CARPAL TUNNEL RELEASE Bilateral 2006  . GLAUCOMA SURGERY Bilateral   . PERINEOPLASTY     and rectocele  . Pulmonary Function Test  August 2013   CPET-PFTs: FVC 58%, FEV1 48%; breathing reserve less than 10%, normal DLCO; reduced vital capacity -- suggesting obstructive lung disease  . SHOULDER ARTHROSCOPY W/ ROTATOR CUFF REPAIR  7/04, 2003; ?date   right, "left side was done a 2nd  time"  . TEE WITHOUT CARDIOVERSION N/A 08/29/2016   Procedure: TRANSESOPHAGEAL ECHOCARDIOGRAM (TEE);  Surgeon: Sanda Klein, MD;  Location: [facility] ENDOSCOPY;  Service: Cardiovascular;  Laterality: N/A;  . TONSILLECTOMY    . TOTAL ABDOMINAL HYSTERECTOMY    . TRANSTHORACIC ECHOCARDIOGRAM  February 2011   EF 55-60%. Grade 1 diastolic dysfunction/relaxation abnormality. Mild MR    Social History:  Social History   Socioeconomic History  . Marital status: Married    Spouse name: Not on file  . Number of children: Not on file  . Years of education: Not on file  . Highest education level: Not on file  Occupational History  . 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 Financial resource strain: Not on file  . Food insecurity:    Worry: Not on file    Inability: Not on file  . Transportation needs:    Medical: Not on file    Non-medical: Not on file  Tobacco Use  . Smoking status: Former Smoker    Packs/day: 0.75    Years: 25.00    Pack years: 18.75    Types: Cigarettes    Last attempt to quit: 1969    Years since quitting: 50.5  . Smokeless tobacco: Never Used  . Tobacco comment: 3/4 ppd 1946-1969 (17.5 pack years)  Substance and Sexual Activity  . Alcohol use: Yes    Alcohol/week: 4.2 oz    Types: 3 Glasses of wine, 4 Shots of liquor per week    Comment: 10/03/2016 "high ball or glass of wine prior to dinner"  . Drug use: No  . Sexual activity: Never    Partners: Male  Lifestyle  . Physical activity:    Days per week: Not on file    Minutes per session: Not on file  . Stress: Not on file  Relationships  . Social connections:    Talks on phone: Not on file    Gets together: Not on file    Attends religious service: Not on file    Active member of club or organization: Not on file    Attends meetings of clubs or organizations: Not on file    Relationship status: Not on file  . Intimate partner violence:    Fear of current or ex partner: Not on file     Emotionally abused: Not on file    Physically abused: Not on file    Forced sexual activity: Not on file  Other Topics Concern  . Not on file  Social History Narrative   She is here with her husband - a retired Lexicographer.    They have 2 children, 3 grandchildren.    She does not smoke (never smoked), and does not drink.    She does not really get a lot of exercise besides what I mentioned. She does walk with   her husband but no  additional exercise beyond that, but which is still relatively adequate.    The patient is a retired Writer. Her husband is a retired Lexicographer, and they live in an independent section of a retirement community in [place].   Family History: Family History  Problem Relation Age of Onset  . Osteoporosis Mother   . Diabetes Paternal Aunt      ALLERGIES:  is allergic to cefaclor and codeine.  Meds: Current Outpatient Medications  Medication Sig Dispense Refill  . acetaminophen (TYLENOL) 325 MG tablet Take 650 mg by mouth every 6 (six) hours as needed for mild pain.     Marland Kitchen amiodarone (PACERONE) 200 MG tablet Take 0.5 tablets (100 mg total) by mouth daily. 45 tablet 3  . apixaban (ELIQUIS) 2.5 MG TABS tablet Take 1 tablet (2.5 mg total) by mouth 2 (two) times daily. 60 tablet 0  . Cholecalciferol (VITAMIN D3) 5000 units TABS Take 2 tablets by mouth daily.    . Coenzyme Q10 (COQ10) 200 MG CAPS Take 1 capsule by mouth daily.     . cyanocobalamin 1000 MCG tablet Take 2,000 mcg by mouth daily.    . dorzolamide-timolol (COSOPT) 22.3-6.8 MG/ML ophthalmic solution Place 1 drop into both eyes 2 (two) times daily.      . fluocinonide (LIDEX) 0.05 % external solution Apply 1 application topically once a week. scalp  1  . ipratropium (ATROVENT) 0.03 % nasal spray 1-2 puffs each nostril every 8 hours if needed 30 mL 12  . Multiple Vitamins-Minerals (PRESERVISION/LUTEIN) CAPS Take 1 capsule by mouth 2 (two) times daily.      . OXYGEN 2LPM     . PROAIR HFA  108 (90 Base) MCG/ACT inhaler inhale 2 puffs by mouth every 6 hours if needed for wheezing or shortness of breath  0  . SYMBICORT 160-4.5 MCG/ACT inhaler inhale 2 puffs by mouth twice a day Rinse mouth after use 10.2 g 0  . loratadine (CLARITIN) 10 MG tablet Take 10 mg by mouth daily as needed for allergies.      No current facility-administered medications for this encounter.     Physical Findings: Wt Readings from Last 3 Encounters:  03/20/18 139 lb 12.8 oz (63.4 kg)  11/26/17 137 lb (62.1 kg)  11/15/17 134 lb 6 oz (61 kg)   Temp Readings from Last 3 Encounters:  03/20/18 97.6 F (36.4 C) (Oral)  11/15/17 97.8 F (36.6 C)  06/14/17 97.9 F (36.6 C) (Oral)   BP Readings from Last 3 Encounters:  03/20/18 (!) 151/63  11/26/17 (!) 156/80  11/15/17 137/64   Pulse Readings from Last 3 Encounters:  03/20/18 93  11/26/17 89  11/15/17 81  In general this is a well appearing elderly caucasian female in no acute distress. She is alert and oriented x4 and appropriate throughout the examination. HEENT reveals that the patient is normocephalic, atraumatic. EOMs are intact. Skin is intact without any evidence of gross lesions. Cardiovascular exam reveals a regular rate and rhythm, no clicks rubs or murmurs are auscultated. Chest is clear to auscultation bilaterally, with an expiratory wheeze along the right lower lung base. Lymphatic assessment is performed and does not reveal any adenopathy in the cervical, supraclavicular, axillary, or inguinal chains.     Lab Findings: Lab Results  Component Value Date   WBC 7.4 10/04/2016   HGB 12.4 10/04/2016   HCT 39.6 10/04/2016   MCV 95.9 10/04/2016   PLT 228 10/04/2016    Radiographic Findings: Ct Chest Wo Contrast  Result Date: 03/18/2018 CLINICAL DATA:  Followup bilateral upper lobe non-small cell lung carcinoma. Cough, dyspnea, and fatigue. Status post stereotactic beam radiotherapy. EXAM: CT CHEST WITHOUT CONTRAST TECHNIQUE:  Multidetector CT imaging of the chest was performed following the standard protocol without IV contrast. COMPARISON:  11/14/2017 FINDINGS: Cardiovascular: No acute findings. Aortic and coronary artery atherosclerosis. Mediastinum/Nodes: No masses or pathologically enlarged lymph nodes identified on this unenhanced exam. Lungs/Pleura: Biapical pleural-parenchymal scarring again noted. Irregular masslike opacity in the right upper lobe measures 2.7 by 2.4 cm on image 32/5, mildly increased compared to 2.3 x 2.0 cm on previous study. Irregular nodular opacity in the anterior left upper lobe is also increased, currently measuring 2.0 x 1.5 cm on image 30/5, compared to 1.3 x 1.1 cm previously. No other new or increased areas of pulmonary opacity are seen. No evidence of pleural effusion. Upper Abdomen:  Unremarkable. Musculoskeletal: No suspicious bone lesions. Old vertebral body wedge compression deformities are again seen in the lower thoracic and upper lumbar spine. IMPRESSION: Increased size of masslike opacities in both upper lobes, which may be due to radiation pneumonitis or progressive carcinoma. Recommend continued follow-up by chest CT in 3 months. No evidence lymphadenopathy or pleural effusion. Aortic Atherosclerosis (ICD10-I70.0). Coronary artery calcification. Electronically Signed   By: Earle Gell M.D.   On: 03/18/2018 15:20    Impression/Plan:  1. Stage IB (T2a, N0, M0) non-small cell lung cancer, adenocarcinoma in the right upper lobe in addition to putative stage IA lesion in the left upper lobe diagnosed in February 2018. The patient appears to be clinically doing well. Her films were reviewed personally by myself and Dr. Sondra Come. She appears to have post radiotherapy change along the upper lobe lesions that were both treated, but no convincing changes to suggest concern for progressive disease. No new lesions or adenopathy was noted either. Dr. Sondra Come recommends proceeding with repeat imaging in  3 months time to ensure there is not change, and if stable, we could move to a 6 month schedule per NCCN guidelines. She and her husband [name] are in agreement.  2. Exertional SOB. The patient has not seen pulmonary since her radiotherapy and may benefit from further evaluation into her PFTs. We will refer her back to see Dr. Melvyn Novas who saw her as an inpatient in 2017, and we appreciate his input regarding any changes to her medication regimen. 3. Weakness and fatigue. She continues to be followed by her cardiologist and PCP, and again no focal findings were noted to suspect source of her symptoms. We again discussed options of trying to consider some low impact exercise. We will follow this expectantly.  In a visit lasting 25 minutes, greater than 50% of the time was spent face to face discussing her case, and coordinating the patient's care.  The above documentation reflects our direct findings during this shared patient visit.      Carola Rhine, PAC  -----------------------------------  Blair Promise, PhD, MD

## 2018-03-20 NOTE — Progress Notes (Addendum)
Stage IB (T2a, N0, M0) non-small cell lung cancer, adenocarcinoma in the right upper lobe in addition to suspicious stage IA in the left upper lobe completed radiation 11-13-16 review 03-18-18 CT chest wo contrast 4 month FU.   Weight changes, if any: Wt Readings from Last 3 Encounters:  11/26/17 137 lb (62.1 kg)  11/15/17 134 lb 6 oz (61 kg)  05/17/17 131 lb 3.2 oz (59.5 kg)   Respiratory complaints, if any: SOB with exertion using O 2 2 L/M N/C prn coughing dry cough O 2 sat 100% Swallowing Problems/Pain/Difficulty swallowing:No swallowing issues. LMP 08/29/1967  BP (!) 151/63 (BP Location: Right Arm, Patient Position: Sitting, Cuff Size: Normal)   Pulse 93   Temp 97.6 F (36.4 C) (Oral)   Resp 18   Ht 5' 2.5" (1.588 m)   Wt 139 lb 12.8 oz (63.4 kg)   LMP 08/29/1967   SpO2 100%   BMI 25.16 kg/m

## 2018-03-20 NOTE — Addendum Note (Signed)
Encounter addended by: Malena Edman, RN on: 03/20/2018 12:26 PM  Actions taken: Charge Capture section accepted

## 2018-03-22 DIAGNOSIS — Z0000 Encounter for general adult medical examination without abnormal findings: Secondary | ICD-10-CM | POA: Diagnosis not present

## 2018-03-22 DIAGNOSIS — J45998 Other asthma: Secondary | ICD-10-CM | POA: Diagnosis not present

## 2018-03-22 DIAGNOSIS — M5431 Sciatica, right side: Secondary | ICD-10-CM | POA: Diagnosis not present

## 2018-03-22 DIAGNOSIS — H9193 Unspecified hearing loss, bilateral: Secondary | ICD-10-CM | POA: Diagnosis not present

## 2018-03-22 DIAGNOSIS — Z1389 Encounter for screening for other disorder: Secondary | ICD-10-CM | POA: Diagnosis not present

## 2018-03-22 DIAGNOSIS — Z6826 Body mass index (BMI) 26.0-26.9, adult: Secondary | ICD-10-CM | POA: Diagnosis not present

## 2018-03-22 DIAGNOSIS — C3490 Malignant neoplasm of unspecified part of unspecified bronchus or lung: Secondary | ICD-10-CM | POA: Diagnosis not present

## 2018-03-22 DIAGNOSIS — M545 Low back pain: Secondary | ICD-10-CM | POA: Diagnosis not present

## 2018-03-22 DIAGNOSIS — M25522 Pain in left elbow: Secondary | ICD-10-CM | POA: Diagnosis not present

## 2018-03-22 DIAGNOSIS — M25512 Pain in left shoulder: Secondary | ICD-10-CM | POA: Diagnosis not present

## 2018-03-22 DIAGNOSIS — B351 Tinea unguium: Secondary | ICD-10-CM | POA: Diagnosis not present

## 2018-03-22 DIAGNOSIS — R0602 Shortness of breath: Secondary | ICD-10-CM | POA: Diagnosis not present

## 2018-04-11 ENCOUNTER — Encounter (HOSPITAL_COMMUNITY): Payer: Self-pay

## 2018-04-11 ENCOUNTER — Ambulatory Visit (HOSPITAL_COMMUNITY)
Admission: RE | Admit: 2018-04-11 | Discharge: 2018-04-11 | Disposition: A | Discharge status: Home / Self Care | Payer: Medicare Other | Source: Ambulatory Visit | Attending: Internal Medicine | Admitting: Internal Medicine

## 2018-04-11 DIAGNOSIS — M810 Age-related osteoporosis without current pathological fracture: Secondary | ICD-10-CM | POA: Diagnosis not present

## 2018-04-11 MED ORDER — SODIUM CHLORIDE 0.9 % IV SOLN
Freq: Once | INTRAVENOUS | Status: AC
  Administered 2018-04-11: 12:00:00 via INTRAVENOUS

## 2018-04-11 MED ORDER — ZOLEDRONIC ACID 5 MG/100ML IV SOLN
INTRAVENOUS | Status: AC
  Administered 2018-04-11: 5 mg
  Filled 2018-04-11: qty 100

## 2018-04-11 MED ORDER — ZOLEDRONIC ACID 5 MG/100ML IV SOLN: 5.0000 mg | Freq: Once | INTRAVENOUS | Status: DC

## 2018-04-11 NOTE — Discharge Instructions (Signed)

## 2018-04-11 NOTE — Progress Notes (Signed)
First reclast infusion given.  Pt tolerated well. Accompanied by husband.  reclast fact sheet given to pt.  Pt stayed for 30 min.observation post infusion.  Pt taken to lobby via wheelchair by husband.

## 2018-04-15 ENCOUNTER — Other Ambulatory Visit: Payer: Medicare Other

## 2018-04-19 DIAGNOSIS — I480 Paroxysmal atrial fibrillation: Secondary | ICD-10-CM | POA: Diagnosis not present

## 2018-04-19 DIAGNOSIS — J189 Pneumonia, unspecified organism: Secondary | ICD-10-CM | POA: Diagnosis not present

## 2018-04-19 DIAGNOSIS — Z6826 Body mass index (BMI) 26.0-26.9, adult: Secondary | ICD-10-CM | POA: Diagnosis not present

## 2018-04-19 DIAGNOSIS — R0600 Dyspnea, unspecified: Secondary | ICD-10-CM | POA: Diagnosis not present

## 2018-04-19 DIAGNOSIS — R0609 Other forms of dyspnea: Secondary | ICD-10-CM | POA: Diagnosis not present

## 2018-04-25 DIAGNOSIS — J45909 Unspecified asthma, uncomplicated: Secondary | ICD-10-CM | POA: Diagnosis not present

## 2018-04-25 DIAGNOSIS — I480 Paroxysmal atrial fibrillation: Secondary | ICD-10-CM | POA: Diagnosis not present

## 2018-04-25 DIAGNOSIS — Z23 Encounter for immunization: Secondary | ICD-10-CM | POA: Diagnosis not present

## 2018-04-25 DIAGNOSIS — Z6825 Body mass index (BMI) 25.0-25.9, adult: Secondary | ICD-10-CM | POA: Diagnosis not present

## 2018-04-25 DIAGNOSIS — R0600 Dyspnea, unspecified: Secondary | ICD-10-CM | POA: Diagnosis not present

## 2018-05-02 ENCOUNTER — Telehealth: Payer: Self-pay | Admitting: *Deleted

## 2018-05-02 NOTE — Telephone Encounter (Signed)
lmtcb to discuss CXR findings

## 2018-05-03 NOTE — Telephone Encounter (Signed)
Follow Up: ° ° ° °Returning your call from yesterday. °

## 2018-05-06 NOTE — Telephone Encounter (Signed)
Informed that after reviewing CXR and chart - no changes need to be made to treatment plan at this time. Informed that she should remain on Amiodarone. Will keep f/u appt next week w/ WC. Husband is appreciative of the follow up.

## 2018-05-17 ENCOUNTER — Ambulatory Visit (INDEPENDENT_AMBULATORY_CARE_PROVIDER_SITE_OTHER): Payer: Medicare Other | Admitting: Cardiology

## 2018-05-17 ENCOUNTER — Encounter: Payer: Self-pay | Admitting: Cardiology

## 2018-05-17 VITALS — BP 126/64 | HR 94 | Ht 62.5 in | Wt 142.0 lb

## 2018-05-17 DIAGNOSIS — Z79899 Other long term (current) drug therapy: Secondary | ICD-10-CM

## 2018-05-17 DIAGNOSIS — I481 Persistent atrial fibrillation: Secondary | ICD-10-CM

## 2018-05-17 DIAGNOSIS — E785 Hyperlipidemia, unspecified: Secondary | ICD-10-CM | POA: Diagnosis not present

## 2018-05-17 DIAGNOSIS — I10 Essential (primary) hypertension: Secondary | ICD-10-CM | POA: Diagnosis not present

## 2018-05-17 DIAGNOSIS — I4819 Other persistent atrial fibrillation: Secondary | ICD-10-CM

## 2018-05-17 NOTE — Progress Notes (Signed)
Electrophysiology Office Note   Date:  05/17/2018   ID:  Jodi Burns, DOB 02/08/1926, MRN [id number]  PCP:  Crist Infante, MD  Cardiologist:  Primary Electrophysiologist:  Will Meredith Leeds, MD    No chief complaint on file.    History of Present Illness: Jodi Burns is a 84 y.o. female who is being seen today for the evaluation of atrial fibrillation at the request of Crist Infante, MD. Presenting today for electrophysiology evaluation. She was hospitalized in January with atrial fibrillation and a URI. Her atrial fibrillation was complicated by hypotension this making rate control difficult. She had an urgent cardioversion and was loaded on amiodarone. She was found to have lung cancer and has since undergone radiation therapy.  Today, denies symptoms of palpitations, chest pain, shortness of breath, orthopnea, PND, lower extremity edema, claudication, dizziness, presyncope, syncope, bleeding, or neurologic sequela. The patient is tolerating medications without difficulties.  Overall he is feeling well.  Her main complaint today is fatigue.  She has not noted palpitations.  Both her and her husband feel like her fatigue may be related to her lung issues.   Past Medical History:  Diagnosis Date  . Allergic rhinitis   . Arthritis    "the normal for my age" (10/03/2016)  . Asthma    "very mild" (10/03/2016)  . Atrial fibrillation ([place])   . Chronic lower back pain   . Dyspnea on exertion   . History of blood transfusion    "I was young; don't remember why I needed it"  . History of external beam radiation therapy 10/31/16-11/13/16   left upper lung treated to 54 Gy in 3 fractions, right upper lung to 50 Gy in 10 fractions  . 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 On home oxygen therapy    "available; not needed it yet" (10/03/2016)  . Osteopenia   . Other and unspecified hyperlipidemia   . Other retinal  disorders(362.89)   . Personal history of other diseases of circulatory system   . Personal history of venous thrombosis and embolism 4/94  . Reactive airway disease   . Subjective visual disturbance, unspecified   . Vitamin D deficiency    Past Surgical History:  Procedure Laterality Date  . APPENDECTOMY    . BREAST BIOPSY Bilateral   . CARDIOVASCULAR STRESS TEST  August 2013   CPET-MET: Submaximal effort, did not reach anaerobic threshold --> peak VO2 76% (not overly concerning for CAD)  . CARDIOVERSION N/A 08/29/2016   Procedure: CARDIOVERSION;  Surgeon: Sanda Klein, MD;  Location: [place];  Service: Cardiovascular;  Laterality: N/A;  . CARPAL TUNNEL RELEASE Bilateral 2006  . GLAUCOMA SURGERY Bilateral   . PERINEOPLASTY     and rectocele  . Pulmonary Function Test  August 2013   CPET-PFTs: FVC 58%, FEV1 48%; breathing reserve less than 10%, normal DLCO; reduced vital capacity -- suggesting obstructive lung disease  . SHOULDER ARTHROSCOPY W/ ROTATOR CUFF REPAIR  7/04, 2003; ?date   right, "left side was done a 2nd time"  . TEE WITHOUT CARDIOVERSION N/A 08/29/2016   Procedure: TRANSESOPHAGEAL ECHOCARDIOGRAM (TEE);  Surgeon: Sanda Klein, MD;  Location: [facility] ENDOSCOPY;  Service: Cardiovascular;  Laterality: N/A;  . TONSILLECTOMY    . TOTAL ABDOMINAL HYSTERECTOMY    . TRANSTHORACIC ECHOCARDIOGRAM  February 2011   EF 55-60%. Grade 1 diastolic dysfunction/relaxation abnormality. Mild MR     Current Outpatient Medications  Medication Sig  Dispense Refill  . acetaminophen (TYLENOL) 325 MG tablet Take 650 mg by mouth every 6 (six) hours as needed for mild pain.     Marland Kitchen amiodarone (PACERONE) 200 MG tablet Take 0.5 tablets (100 mg total) by mouth daily. 45 tablet 3  . apixaban (ELIQUIS) 2.5 MG TABS tablet Take 1 tablet (2.5 mg total) by mouth 2 (two) times daily. 60 tablet 0  . Cholecalciferol (VITAMIN D3) 5000 units TABS Take 2 tablets by mouth daily.    . Coenzyme Q10 (COQ10) 200 MG  CAPS Take 1 capsule by mouth daily.     . cyanocobalamin 1000 MCG tablet Take 2,000 mcg by mouth daily.    . dorzolamide-timolol (COSOPT) 22.3-6.8 MG/ML ophthalmic solution Place 1 drop into both eyes 2 (two) times daily.      . fluocinonide (LIDEX) 0.05 % external solution Apply 1 application topically once a week. scalp  1  . ipratropium (ATROVENT) 0.03 % nasal spray 1-2 puffs each nostril every 8 hours if needed 30 mL 12  . loratadine (CLARITIN) 10 MG tablet Take 10 mg by mouth daily as needed for allergies.     . Multiple Vitamins-Minerals (PRESERVISION/LUTEIN) CAPS Take 1 capsule by mouth 2 (two) times daily.      . OXYGEN 2LPM     . PROAIR HFA 108 (90 Base) MCG/ACT inhaler inhale 2 puffs by mouth every 6 hours if needed for wheezing or shortness of breath  0  . SYMBICORT 160-4.5 MCG/ACT inhaler inhale 2 puffs by mouth twice a day Rinse mouth after use 10.2 g 0   No current facility-administered medications for this visit.     Allergies:   Cefaclor and Codeine   Social History:  The patient  reports that she quit smoking about 50 years ago. Her smoking use included cigarettes. She has a 18.75 pack-year smoking history. She has never used smokeless tobacco. She reports that she drinks about 7.0 standard drinks of alcohol per week. She reports that she does not use drugs.   Family History:  The patient's family history includes Diabetes in her paternal aunt; Osteoporosis in her mother.    ROS:  Please see the history of present illness.   Otherwise, review of systems is positive for fatigue, shortness of breath, cough.   All other systems are reviewed and negative.   PHYSICAL EXAM: VS:  BP 126/64   Pulse 94   Ht 5' 2.5" (1.588 m)   Wt 142 lb (64.4 kg)   LMP 08/29/1967   SpO2 98%   BMI 25.56 kg/m  , BMI Body mass index is 25.56 kg/m. GEN: Well nourished, well developed, in no acute distress  HEENT: normal  Neck: no JVD, carotid bruits, or masses Cardiac: RRR; no murmurs, rubs,  or gallops,no edema  Respiratory:  clear to auscultation bilaterally, normal work of breathing GI: soft, nontender, nondistended, + BS MS: no deformity or atrophy  Skin: warm and dry Neuro:  Strength and sensation are intact Psych: euthymic mood, full affect  EKG:  EKG is ordered today. Personal review of the ekg ordered shows sinus rhythm, prolonged QTC, rate 94  Recent Labs: No results found for requested labs within last 8760 hours.    Lipid Panel  No results found for: CHOL, TRIG, HDL, CHOLHDL, VLDL, LDLCALC, LDLDIRECT   Wt Readings from Last 3 Encounters:  05/17/18 142 lb (64.4 kg)  04/11/18 132 lb (59.9 kg)  03/20/18 139 lb 12.8 oz (63.4 kg)      Other studies  Reviewed: Additional studies/ records that were reviewed today include: TEE 08/29/16  Review of the above records today demonstrates:  - Left ventricle: The cavity size was normal. Wall thickness was   normal. Systolic function was normal. The estimated ejection   fraction was in the range of 60% to 65%. Wall motion was normal;   there were no regional wall motion abnormalities. No evidence of   thrombus. - Aortic valve: No evidence of vegetation. No evidence of   vegetation. - Mitral valve: No evidence of vegetation. - Left atrium: No evidence of thrombus in the atrial cavity or   appendage. - Right atrium: No evidence of thrombus in the atrial cavity or   appendage. - Atrial septum: No defect or patent foramen ovale was identified. - Tricuspid valve: Mild prolapse. No evidence of vegetation. There   was mild-moderate regurgitation directed centrally. - Pulmonic valve: No evidence of vegetation.   ASSESSMENT AND PLAN:  1.  Persistent atrial fibrillation: Currently on amiodarone and Eliquis.  Amiodarone is at 100 mg.  She remains in sinus rhythm.  Diltiazem was stopped due to fatigue.  She is continuing to have episodes of fatigue.  No changes.  She had a recent TIA on at 2.24 on 03/15/2018.  This patients  CHA2DS2-VASc Score and unadjusted Ischemic Stroke Rate (% per year) is equal to 4.8 % stroke rate/year from a score of 4  Above score calculated as 1 point each if present [CHF, HTN, DM, Vascular=MI/PAD/Aortic Plaque, Age if 65-74, or Female] Above score calculated as 2 points each if present [Age > 75, or Stroke/TIA/TE]   2. Hyperlipidemia: Has been taken off of simvastatin by her primary physician  3. Hypertension: Well-controlled today.  No changes.  Current medicines are reviewed at length with the patient today.   The patient does not have concerns regarding her medicines.  The following changes were made today: None  Labs/ tests ordered today include:  Orders Placed This Encounter  Procedures  . Hepatic function panel  . EKG 12-Lead     Disposition:   FU with Will Camnitz 6 months  Signed, Will Meredith Leeds, MD  05/17/2018 3:14 PM     [address] [place] [address] [phone] (office) [phone] (fax)

## 2018-05-17 NOTE — Patient Instructions (Addendum)
Medication Instructions:  Your physician recommends that you continue on your current medications as directed. Please refer to the Current Medication list given to you today.  * If you need a refill on your cardiac medications before your next appointment, please call your pharmacy.   Labwork: Today: Liver function lab test *We will only notify you of abnormal results, otherwise continue current treatment plan.  Testing/Procedures: None ordered  Follow-Up: Your physician wants you to follow-up in: 6 months with Dr. Curt Bears.  You will receive a reminder letter in the mail two months in advance. If you don't receive a letter, please call our office to schedule the follow-up appointment.  Thank you for choosing [facility]!!   Trinidad Curet, RN [phone]

## 2018-05-18 LAB — HEPATIC FUNCTION PANEL
ALT: 9 IU/L (ref 0–32)
AST: 14 IU/L (ref 0–40)
Albumin: 3.9 g/dL (ref 3.5–4.7)
Alkaline Phosphatase: 82 IU/L (ref 39–117)
Bilirubin Total: <0.2 mg/dL (ref 0.0–1.2)
Bilirubin, Direct: 0.08 mg/dL (ref 0.00–0.40)
Total Protein: 6.5 g/dL (ref 6.0–8.5)

## 2018-05-20 ENCOUNTER — Ambulatory Visit (HOSPITAL_COMMUNITY): Payer: Medicare Other

## 2018-05-20 ENCOUNTER — Ambulatory Visit: Payer: Medicare Other

## 2018-05-22 ENCOUNTER — Ambulatory Visit: Payer: Self-pay | Admitting: Radiation Oncology

## 2018-05-27 ENCOUNTER — Ambulatory Visit (INDEPENDENT_AMBULATORY_CARE_PROVIDER_SITE_OTHER): Payer: Medicare Other | Admitting: Internal Medicine

## 2018-05-27 ENCOUNTER — Encounter: Payer: Self-pay | Admitting: Internal Medicine

## 2018-05-27 VITALS — BP 144/92 | HR 78 | Ht 64.0 in | Wt 144.4 lb

## 2018-05-27 DIAGNOSIS — J449 Chronic obstructive pulmonary disease, unspecified: Secondary | ICD-10-CM

## 2018-05-27 DIAGNOSIS — J9611 Chronic respiratory failure with hypoxia: Secondary | ICD-10-CM | POA: Diagnosis not present

## 2018-05-27 DIAGNOSIS — I4891 Unspecified atrial fibrillation: Secondary | ICD-10-CM

## 2018-05-27 DIAGNOSIS — R0609 Other forms of dyspnea: Secondary | ICD-10-CM | POA: Diagnosis not present

## 2018-05-27 DIAGNOSIS — C3411 Malignant neoplasm of upper lobe, right bronchus or lung: Secondary | ICD-10-CM

## 2018-05-27 DIAGNOSIS — J45909 Unspecified asthma, uncomplicated: Secondary | ICD-10-CM

## 2018-05-27 MED ORDER — LEVALBUTEROL HCL 0.63 MG/3ML IN NEBU
0.6300 mg | INHALATION_SOLUTION | Freq: Once | RESPIRATORY_TRACT | Status: AC
  Administered 2018-05-27: 0.63 mg via RESPIRATORY_TRACT

## 2018-05-27 NOTE — Assessment & Plan Note (Signed)
Apical masses bilaterally looks similar and were both treated with XRT.  She continues follow-up with pending CT.

## 2018-05-27 NOTE — Patient Instructions (Addendum)
Order- walk test on room air     Dx dyspnea on exertion  Order- neb xop 0.63  Order- DME [facility]- O2 2L sleep . Add light POC or home-fill 2L pulse for exertion    Dx chronic respiratory failure with hypoxia.

## 2018-05-27 NOTE — Assessment & Plan Note (Signed)
Mild obstructive airways disease previously.  She is using Symbicort and albuterol appropriately as discussed.

## 2018-05-27 NOTE — Assessment & Plan Note (Signed)
She has had oxygen at home for the past year but not really using it.  When husband has checked saturation he finds it in the mid 90s.  She desaturated easily with walking at this visit and almost certainly desaturates and sleep. Plan-use oxygen 2 L for sleep.  Home care company will show her how to use her home fill portable system.

## 2018-05-27 NOTE — Assessment & Plan Note (Signed)
Rhythm very regular, consistent with normal sinus rhythm on exam at this visit.  Followed by cardiology.

## 2018-05-27 NOTE — Progress Notes (Signed)
Patient ID: Jodi Burns, female    DOB: 11-29-1925, 84 y.o.   MRN: [id number]  HPI 04/25/11- 47 yoF former smoker followed for asthma complicated by hx PE, Hx AFib, legally blind( Histo retinitis)     Husband here ([name]) Walk Test on room air 05/27/2018-desaturation to 81% with HR 93, correcting with O2 2 L  --------------------------------------------------------------------------------------------------------  01/20/2016-84 year old female former smoker followed for asthma complicated by history PE, PA.fib, legally blind (Histo retinitis) Husband here ([name] retired pediatrician) FOLLOWS FOR: Pt states she has been doing well overall; slight drainage without color fron nose. Pt denies any wheezing as well. She has not needed rescue inhaler and is just using 1 puff of Symbicort twice daily. [name] asks about trying to come off of this. Claritin did not help watery clear nasal drip. We we reviewed her previous lack of success with Flonase and Astelin nasal sprays.  05/27/2018- 85 year old female former smoker followed for asthma, bronchogenic Ad[place] CA, XRT to RUL., bilateral upper lobe lung masses c/w CA  complicated by history PE, PA.fib, legally blind (Histo retinitis) Husband here ([name] retired pediatrician) 02 2L/ [facility]- -----Asthma: Pt to follow up per Worthy Flank, PA due to SOB that is worse.  Needle biopsy diagnosis adenocarcinoma right upper lobe.  Had XRT to both upper lobes 1 year ago. She gets very little exercise according to her husband.  She describes easy dyspnea on exertion walking more than a few rooms.  Little cough.  Nonspecific and shifting musculoskeletal type aches and pains.  She and her husband do not feel any of this is new or progressive. Continue Symbicort twice daily.  Uses her rescue inhaler occasionally.  Has home oxygen from a year ago used only irregularly.  Husband has checked her saturation at home a few times on  room air and says it runs in the mid 90s, even if she is feeling SOB but I do not think this has ever been with exertion. She complains today of persistent vague tightness in her chest, sitting in our office, 45 minutes after she walked from the car.  No radiation or palpitation.  She does not seem sure if Symbicort or her rescue inhaler address this feeling.  It seems to be relieved by stretching. CT chest 03/18/2018-  Images reviewed by me. F/u scheduled in October IMPRESSION: Increased size of masslike opacities in both upper lobes, which may be due to radiation pneumonitis or progressive carcinoma. Recommend continued follow-up by chest CT in 3 months. No evidence lymphadenopathy or pleural effusion. Aortic Atherosclerosis (ICD10-I70.0). Coronary artery calcification Walk Test on room air 05/27/2018-desaturation to 81% with HR 93, correcting with O2 2 L  Review of Systems- see HPI + = positive Constitutional:   No-   weight loss, night sweats, fevers, chills, fatigue, lassitude. HEENT:   No-  headaches, difficulty swallowing, tooth/dental problems, sore throat,       No-  sneezing, itching, ear ache, nasal congestion, +post nasal drip,  CV:  No-   chest pain, orthopnea, PND, swelling in lower extremities, anasarca, dizziness, palpitations Resp: + shortness of breath with exertion, but she is not very active.Marland Kitchen              No-   productive cough,  No non-productive cough,  No-  coughing up of blood.              No-   change in color of mucus.  No- wheezing.   Skin: No-  rash or lesions. GI:  No-   heartburn, indigestion, abdominal pain, nausea, vomiting,  GU:  MS:  +  joint pain or swelling.   Neuro- nothing unusual Psych:  No- change in mood or affect. No depression or anxiety.  No memory loss. Ior Objective:   Physical Exam General- Alert, Oriented, Affect-appropriate, Distress- none acute  Slender cheerful Skin- rash-none, lesions- none, excoriation- none Lymphadenopathy-  none Head- atraumatic            Eyes- Gross vision -limited to light and dark,  conjunctivae clear secretions            Ears- Hearing, canals normal                          Nose- Clear, No-Septal dev, mucus, polyps, erosion, perforation ,             Throat- Mallampati II , mucosa clear , drainage- none, tonsils- atrophic Neck- flexible , trachea midline, no stridor , thyroid nl, carotid no bruit Chest - symmetrical excursion , unlabored           Heart/CV- +R RR c/w NSR , no murmur , no gallop  , no rub, nl s1 s2                           - JVD- none , edema- none, stasis changes- none, varices- none           Lung- clear to P&A, wheeze- none, cough- none , dullness-none, rub- none           Chest wall-  Abd-  Br/ Gen/ Rectal- Not done, not indicated Extrem- cyanosis- none, clubbing, none, atrophy- none, strength- nl Neuro- grossly intact to observation

## 2018-06-20 ENCOUNTER — Other Ambulatory Visit: Payer: Self-pay

## 2018-06-20 ENCOUNTER — Ambulatory Visit
Admission: RE | Admit: 2018-06-20 | Discharge: 2018-06-20 | Disposition: A | Discharge status: Home / Self Care | Payer: Medicare Other | Source: Ambulatory Visit | Attending: Radiation Oncology | Admitting: Radiation Oncology

## 2018-06-20 ENCOUNTER — Ambulatory Visit (HOSPITAL_COMMUNITY)
Admission: RE | Admit: 2018-06-20 | Discharge: 2018-06-20 | Disposition: A | Discharge status: Home / Self Care | Payer: Medicare Other | Source: Ambulatory Visit | Attending: Radiation Oncology | Admitting: Radiation Oncology

## 2018-06-20 DIAGNOSIS — C3411 Malignant neoplasm of upper lobe, right bronchus or lung: Secondary | ICD-10-CM

## 2018-06-20 DIAGNOSIS — J432 Centrilobular emphysema: Secondary | ICD-10-CM | POA: Diagnosis not present

## 2018-06-20 DIAGNOSIS — C3490 Malignant neoplasm of unspecified part of unspecified bronchus or lung: Secondary | ICD-10-CM | POA: Diagnosis not present

## 2018-06-20 DIAGNOSIS — R918 Other nonspecific abnormal finding of lung field: Secondary | ICD-10-CM | POA: Insufficient documentation

## 2018-06-20 DIAGNOSIS — I700 Atherosclerosis of aorta: Secondary | ICD-10-CM | POA: Diagnosis not present

## 2018-06-20 DIAGNOSIS — I2510 Atherosclerotic heart disease of native coronary artery without angina pectoris: Secondary | ICD-10-CM | POA: Insufficient documentation

## 2018-06-20 LAB — BUN & CREATININE (CHCC)
BUN: 11 mg/dL (ref 8–23)
Creatinine: 0.7 mg/dL (ref 0.44–1.00)
GFR, Est AFR Am: >60 mL/min (ref >60)

## 2018-06-20 MED ORDER — IOHEXOL 300 MG/ML  SOLN
75.0000 mL | Freq: Once | INTRAMUSCULAR | Status: AC | PRN
  Administered 2018-06-20: 75 mL via INTRAVENOUS

## 2018-06-20 MED ORDER — SODIUM CHLORIDE 0.9 % IJ SOLN
INTRAMUSCULAR | Status: AC
  Filled 2018-06-20: qty 50

## 2018-06-24 ENCOUNTER — Ambulatory Visit
Admission: RE | Admit: 2018-06-24 | Discharge: 2018-06-24 | Disposition: A | Discharge status: Home / Self Care | Payer: Medicare Other | Source: Ambulatory Visit | Attending: Radiation Oncology | Admitting: Radiation Oncology

## 2018-06-24 ENCOUNTER — Encounter: Payer: Self-pay | Admitting: Radiation Oncology

## 2018-06-24 ENCOUNTER — Other Ambulatory Visit: Payer: Self-pay

## 2018-06-24 VITALS — BP 154/72 | HR 92 | Temp 97.8°F | Resp 18 | Ht 62.5 in | Wt 143.4 lb

## 2018-06-24 DIAGNOSIS — Z9981 Dependence on supplemental oxygen: Secondary | ICD-10-CM | POA: Insufficient documentation

## 2018-06-24 DIAGNOSIS — Z08 Encounter for follow-up examination after completed treatment for malignant neoplasm: Secondary | ICD-10-CM | POA: Diagnosis not present

## 2018-06-24 DIAGNOSIS — C3411 Malignant neoplasm of upper lobe, right bronchus or lung: Secondary | ICD-10-CM | POA: Diagnosis not present

## 2018-06-24 DIAGNOSIS — Z79899 Other long term (current) drug therapy: Secondary | ICD-10-CM | POA: Diagnosis not present

## 2018-06-24 DIAGNOSIS — C3490 Malignant neoplasm of unspecified part of unspecified bronchus or lung: Secondary | ICD-10-CM | POA: Insufficient documentation

## 2018-06-24 DIAGNOSIS — R0602 Shortness of breath: Secondary | ICD-10-CM | POA: Diagnosis not present

## 2018-06-24 DIAGNOSIS — I2510 Atherosclerotic heart disease of native coronary artery without angina pectoris: Secondary | ICD-10-CM | POA: Diagnosis not present

## 2018-06-24 DIAGNOSIS — J439 Emphysema, unspecified: Secondary | ICD-10-CM | POA: Diagnosis not present

## 2018-06-24 DIAGNOSIS — Z881 Allergy status to other antibiotic agents status: Secondary | ICD-10-CM | POA: Diagnosis not present

## 2018-06-24 DIAGNOSIS — R918 Other nonspecific abnormal finding of lung field: Secondary | ICD-10-CM | POA: Diagnosis not present

## 2018-06-24 DIAGNOSIS — Z885 Allergy status to narcotic agent status: Secondary | ICD-10-CM | POA: Diagnosis not present

## 2018-06-24 NOTE — Progress Notes (Signed)
Pt here today for a follow-up appointment for lung cancer. Pt states mild back pain that is a 2 out of 10. Pt states moderate fatigue. Pt denies having an occasional dry cough. Pt denies having hemoptysis. Pt states shortness of breath on exertion. Pt denies having any painful or diffiuclt swallowing. Pt denies having any skin irritation since radiation treatments. Pt is not currently on chemotherapy treatments. Pt states that she has a fair appetite.  BP (!) 154/72 (BP Location: Left Arm)   Pulse 92   Temp 97.8 F (36.6 C) (Oral)   Resp 18   Ht 5' 2.5" (1.588 m)   Wt 143 lb 6.4 oz (65 kg)   LMP 08/29/1967   SpO2 99%   BMI 25.81 kg/m    Wt Readings from Last 3 Encounters:  06/24/18 143 lb 6.4 oz (65 kg)  05/27/18 144 lb 6.4 oz (65.5 kg)  05/17/18 142 lb (64.4 kg)

## 2018-06-24 NOTE — Progress Notes (Signed)
Radiation Oncology         [phone] ________________________________  Name: Jodi Burns MRN: [id number]  Date: 06/24/2018  DOB: 02/26/1926  Follow-Up Visit Note  CC: Crist Infante, MD  Melrose Nakayama, *  Diagnosis:   84 y.o. woman with Stage IB (T2a, N0, M0) non-small cell lung cancer, adenocarcinoma in the right upper lobe in addition to putative stage IA lesion in the left upper lobe diagnosed in February 2018.  Interval Since Last Radiation:  1 year, 7 months  10/31/2016 - 11/13/2016 SBRT  Radiotherapy: Left upper lung treated to 54 Gy at 3 fractions. Right upper lung to 50 Gy at 10 fractions.   Narrative:  Jodi Burns is a pleasant 84 y.o. female who presents to the clinic today for a 3 month follow-up for non-small cell lung cancer. Since her last visit to the clinic she received a CT chest with contrast that showed similar appearance of bilateral upper lobe areas of masslike consolidation and architectural distortion, favored to be radiation induced; no thoracic adenopathy; aortic atherosclerosis (ICD10-I70.0), coronary artery atherosclerosis and emphysema (ICD10-J43.9); as well as left sided solid and ground-glass nodules are not significantly changed.   On review of systems, she reports shortness of breath upon exertion. She requires oxygen, especially in the morning (she says it is "PRN"). Her husband states she has a pervasive weakness, potentially caused by "deep anesthesia" from shoulder surgeries in the 1970s. Patient states she feels physically stronger but has been falling, most recently approx. 1 week ago. she denies coughing up blood and any other symptoms. Pertinent positives are listed and detailed within the above HPI.    Past Medical History:  Past Medical History:  Diagnosis Date  . Allergic rhinitis   . Arthritis    "the normal for my age" (10/03/2016)  . Asthma    "very mild" (10/03/2016)  . Atrial fibrillation ([place])   . Chronic lower  back pain   . Dyspnea on exertion   . History of blood transfusion    "I was young; don't remember why I needed it"  . History of external beam radiation therapy 10/31/16-11/13/16   left upper lung treated to 54 Gy in 3 fractions, right upper lung to 50 Gy in 10 fractions  . 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 On home oxygen therapy    "available; not needed it yet" (10/03/2016)  . Osteopenia   . Other and unspecified hyperlipidemia   . Other retinal disorders(362.89)   . Personal history of other diseases of circulatory system   . Personal history of venous thrombosis and embolism 4/94  . Reactive airway disease   . Subjective visual disturbance, unspecified   . Vitamin D deficiency     Past Surgical History: Past Surgical History:  Procedure Laterality Date  . APPENDECTOMY    . BREAST BIOPSY Bilateral   . CARDIOVASCULAR STRESS TEST  August 2013   CPET-MET: Submaximal effort, did not reach anaerobic threshold --> peak VO2 76% (not overly concerning for CAD)  . CARDIOVERSION N/A 08/29/2016   Procedure: CARDIOVERSION;  Surgeon: Sanda Klein, MD;  Location: [facility];  Service: Cardiovascular;  Laterality: N/A;  . CARPAL TUNNEL RELEASE Bilateral 2006  . GLAUCOMA SURGERY Bilateral   . PERINEOPLASTY     and rectocele  . Pulmonary Function Test  August 2013   CPET-PFTs: FVC 58%, FEV1 48%; breathing reserve less than 10%, normal DLCO; reduced vital capacity --  suggesting obstructive lung disease  . SHOULDER ARTHROSCOPY W/ ROTATOR CUFF REPAIR  7/04, 2003; ?date   right, "left side was done a 2nd time"  . TEE WITHOUT CARDIOVERSION N/A 08/29/2016   Procedure: TRANSESOPHAGEAL ECHOCARDIOGRAM (TEE);  Surgeon: Sanda Klein, MD;  Location[facility] ENDOSCOPY;  Service: Cardiovascular;  Laterality: N/A;  . TONSILLECTOMY    . TOTAL ABDOMINAL HYSTERECTOMY    . TRANSTHORACIC ECHOCARDIOGRAM  February 2011   EF 55-60%. Grade 1 diastolic  dysfunction/relaxation abnormality. Mild MR    Family History  Problem Relation Age of Onset  . Osteoporosis Mother   . Diabetes Paternal Aunt      ALLERGIES:  is allergic to cefaclor and codeine.  Meds: Current Outpatient Medications  Medication Sig Dispense Refill  . acetaminophen (TYLENOL) 325 MG tablet Take 650 mg by mouth every 6 (six) hours as needed for mild pain.     Marland Kitchen amiodarone (PACERONE) 200 MG tablet Take 0.5 tablets (100 mg total) by mouth daily. 45 tablet 3  . apixaban (ELIQUIS) 2.5 MG TABS tablet Take 1 tablet (2.5 mg total) by mouth 2 (two) times daily. 60 tablet 0  . Cholecalciferol (VITAMIN D3) 5000 units TABS Take 2 tablets by mouth daily.    . Coenzyme Q10 (COQ10) 200 MG CAPS Take 1 capsule by mouth daily.     . cyanocobalamin 1000 MCG tablet Take 2,000 mcg by mouth daily.    . dorzolamide-timolol (COSOPT) 22.3-6.8 MG/ML ophthalmic solution Place 1 drop into both eyes 2 (two) times daily.      . fluocinonide (LIDEX) 0.05 % external solution Apply 1 application topically once a week. scalp  1  . ipratropium (ATROVENT) 0.03 % nasal spray 1-2 puffs each nostril every 8 hours if needed 30 mL 12  . loratadine (CLARITIN) 10 MG tablet Take 10 mg by mouth daily as needed for allergies.     . Multiple Vitamins-Minerals (PRESERVISION/LUTEIN) CAPS Take 1 capsule by mouth 2 (two) times daily.      . OXYGEN 2LPM     . PROAIR HFA 108 (90 Base) MCG/ACT inhaler inhale 2 puffs by mouth every 6 hours if needed for wheezing or shortness of breath  0  . SYMBICORT 160-4.5 MCG/ACT inhaler inhale 2 puffs by mouth twice a day Rinse mouth after use 10.2 g 0   No current facility-administered medications for this encounter.     Physical Findings: Wt Readings from Last 3 Encounters:  06/24/18 143 lb 6.4 oz (65 kg)  05/27/18 144 lb 6.4 oz (65.5 kg)  05/17/18 142 lb (64.4 kg)   Temp Readings from Last 3 Encounters:  06/24/18 97.8 F (36.6 C) (Oral)  04/11/18 98 F (36.7 C) (Oral)    03/20/18 97.6 F (36.4 C) (Oral)   BP Readings from Last 3 Encounters:  06/24/18 (!) 154/72  05/27/18 (!) 144/92  05/17/18 126/64   Pulse Readings from Last 3 Encounters:  06/24/18 92  05/27/18 78  05/17/18 94  In general this is a well appearing elderly caucasian female in no acute distress. She is alert and oriented x4 and appropriate throughout the examination. HEENT reveals that the patient is normocephalic, atraumatic. EOMs are intact. Skin is intact without any evidence of gross lesions. Cardiovascular exam reveals a regular rate and rhythm, no clicks rubs or murmurs are auscultated. Chest is clear to auscultation bilaterally. Lymphatic assessment is performed and does not reveal any adenopathy in the cervical, supraclavicular, axillary regions.    Lab Findings: Lab Results  Component Value Date  WBC 7.4 10/04/2016   HGB 12.4 10/04/2016   HCT 39.6 10/04/2016   MCV 95.9 10/04/2016   PLT 228 10/04/2016    Radiographic Findings: Ct Chest W Contrast  Result Date: 06/21/2018 CLINICAL DATA:  Follow-up of lung cancer diagnosed in 2017 with radiation therapy completed. Low back pain and fatigue. Right upper lobe primary. EXAM: CT CHEST WITH CONTRAST TECHNIQUE: Multidetector CT imaging of the chest was performed during intravenous contrast administration. CONTRAST:  42m OMNIPAQUE IOHEXOL 300 MG/ML  SOLN COMPARISON:  03/18/2018 FINDINGS: Cardiovascular: Advanced aortic and branch vessel atherosclerosis. Tortuous thoracic aorta. Normal heart size, without pericardial effusion. Multivessel coronary artery atherosclerosis. No central pulmonary embolism, on this non-dedicated study. Mediastinum/Nodes: No supraclavicular adenopathy. No mediastinal or hilar adenopathy. Lungs/Pleura: Trace bilateral pleural thickening. Mild centrilobular emphysema. Adjacent 3-4 mm left lower lobe pulmonary nodules x2, including on images 101-100 3/5, similar. 2 mm subpleural left lower lobe pulmonary nodule on  image 65/5, similar. A ground-glass nodule of 6 mm and in the left lower lobe on image 53/5, similar. The right upper lobe area of architectural distortion and masslike consolidation measures on the order of 2.7 x 2.8 cm on image 31/5. Felt to be similar to on the prior exam (when remeasured). Anterolateral left upper lobe pleural-based consolidation again identified. The most masslike, measurable component measures 2.0 x 1.6 cm on image 28/5. Felt to be similar to 2.0 x 1.5 cm on the prior. Upper Abdomen: Normal imaged portions of the liver, stomach, pancreas, gallbladder, adrenal glands, kidneys. Old granulomatous disease in the spleen. Musculoskeletal: Mild osteopenia. Left rotator cuff repair. Compression deformities involving T10 and L2 superior endplates are similar and mild. No ventral canal encroachment. IMPRESSION: 1. Similar appearance of bilateral upper lobe areas of masslike consolidation and architectural distortion, favored to be radiation induced. 2. No thoracic adenopathy. 3. Aortic atherosclerosis (ICD10-I70.0), coronary artery atherosclerosis and emphysema (ICD10-J43.9). 4. Left sided solid and ground-glass nodules are not significantly changed. Electronically Signed   By: KAbigail MiyamotoM.D.   On: 06/21/2018 10:44    Impression: Clinically stable, no evidence of progression on recent chest CT scan.   Plan: Per NCCN Guidelines: Routine f/u in 6 months. She will have a chest CT scan prior to that follow-up visit.  -----------------------------------  JBlair Promise PhD, MD   This document serves as a record of services personally performed by JGery Pray MD. It was created on his behalf by Mary-Margaret CLoma Messing a trained medical scribe. The creation of this record is based on the scribe's personal observations and the provider's statements to them. This document has been checked and approved by the attending provider.

## 2018-07-23 DIAGNOSIS — Z6826 Body mass index (BMI) 26.0-26.9, adult: Secondary | ICD-10-CM | POA: Diagnosis not present

## 2018-07-23 DIAGNOSIS — M545 Low back pain: Secondary | ICD-10-CM | POA: Diagnosis not present

## 2018-07-23 DIAGNOSIS — I480 Paroxysmal atrial fibrillation: Secondary | ICD-10-CM | POA: Diagnosis not present

## 2018-07-23 DIAGNOSIS — R2689 Other abnormalities of gait and mobility: Secondary | ICD-10-CM | POA: Diagnosis not present

## 2018-07-23 DIAGNOSIS — I10 Essential (primary) hypertension: Secondary | ICD-10-CM | POA: Diagnosis not present

## 2018-07-23 DIAGNOSIS — M810 Age-related osteoporosis without current pathological fracture: Secondary | ICD-10-CM | POA: Diagnosis not present

## 2018-07-23 DIAGNOSIS — C3490 Malignant neoplasm of unspecified part of unspecified bronchus or lung: Secondary | ICD-10-CM | POA: Diagnosis not present

## 2018-08-02 DIAGNOSIS — M545 Low back pain: Secondary | ICD-10-CM | POA: Diagnosis not present

## 2018-08-02 DIAGNOSIS — M6281 Muscle weakness (generalized): Secondary | ICD-10-CM | POA: Diagnosis not present

## 2018-08-02 DIAGNOSIS — R278 Other lack of coordination: Secondary | ICD-10-CM | POA: Diagnosis not present

## 2018-08-02 DIAGNOSIS — R2681 Unsteadiness on feet: Secondary | ICD-10-CM | POA: Diagnosis not present

## 2018-08-05 DIAGNOSIS — R278 Other lack of coordination: Secondary | ICD-10-CM | POA: Diagnosis not present

## 2018-08-05 DIAGNOSIS — R2681 Unsteadiness on feet: Secondary | ICD-10-CM | POA: Diagnosis not present

## 2018-08-05 DIAGNOSIS — M6281 Muscle weakness (generalized): Secondary | ICD-10-CM | POA: Diagnosis not present

## 2018-08-05 DIAGNOSIS — M545 Low back pain: Secondary | ICD-10-CM | POA: Diagnosis not present

## 2018-08-06 DIAGNOSIS — M545 Low back pain: Secondary | ICD-10-CM | POA: Diagnosis not present

## 2018-08-06 DIAGNOSIS — R2681 Unsteadiness on feet: Secondary | ICD-10-CM | POA: Diagnosis not present

## 2018-08-06 DIAGNOSIS — R278 Other lack of coordination: Secondary | ICD-10-CM | POA: Diagnosis not present

## 2018-08-06 DIAGNOSIS — M6281 Muscle weakness (generalized): Secondary | ICD-10-CM | POA: Diagnosis not present

## 2018-08-09 DIAGNOSIS — M6281 Muscle weakness (generalized): Secondary | ICD-10-CM | POA: Diagnosis not present

## 2018-08-09 DIAGNOSIS — R278 Other lack of coordination: Secondary | ICD-10-CM | POA: Diagnosis not present

## 2018-08-09 DIAGNOSIS — R2681 Unsteadiness on feet: Secondary | ICD-10-CM | POA: Diagnosis not present

## 2018-08-09 DIAGNOSIS — M545 Low back pain: Secondary | ICD-10-CM | POA: Diagnosis not present

## 2018-08-12 DIAGNOSIS — M545 Low back pain: Secondary | ICD-10-CM | POA: Diagnosis not present

## 2018-08-12 DIAGNOSIS — R2681 Unsteadiness on feet: Secondary | ICD-10-CM | POA: Diagnosis not present

## 2018-08-12 DIAGNOSIS — M6281 Muscle weakness (generalized): Secondary | ICD-10-CM | POA: Diagnosis not present

## 2018-08-12 DIAGNOSIS — R278 Other lack of coordination: Secondary | ICD-10-CM | POA: Diagnosis not present

## 2018-08-14 DIAGNOSIS — R2681 Unsteadiness on feet: Secondary | ICD-10-CM | POA: Diagnosis not present

## 2018-08-14 DIAGNOSIS — M545 Low back pain: Secondary | ICD-10-CM | POA: Diagnosis not present

## 2018-08-14 DIAGNOSIS — M6281 Muscle weakness (generalized): Secondary | ICD-10-CM | POA: Diagnosis not present

## 2018-08-14 DIAGNOSIS — R278 Other lack of coordination: Secondary | ICD-10-CM | POA: Diagnosis not present

## 2018-08-16 DIAGNOSIS — M6281 Muscle weakness (generalized): Secondary | ICD-10-CM | POA: Diagnosis not present

## 2018-08-16 DIAGNOSIS — R278 Other lack of coordination: Secondary | ICD-10-CM | POA: Diagnosis not present

## 2018-08-16 DIAGNOSIS — M545 Low back pain: Secondary | ICD-10-CM | POA: Diagnosis not present

## 2018-08-16 DIAGNOSIS — R2681 Unsteadiness on feet: Secondary | ICD-10-CM | POA: Diagnosis not present

## 2018-08-20 DIAGNOSIS — R278 Other lack of coordination: Secondary | ICD-10-CM | POA: Diagnosis not present

## 2018-08-20 DIAGNOSIS — M545 Low back pain: Secondary | ICD-10-CM | POA: Diagnosis not present

## 2018-08-20 DIAGNOSIS — R2681 Unsteadiness on feet: Secondary | ICD-10-CM | POA: Diagnosis not present

## 2018-08-20 DIAGNOSIS — M6281 Muscle weakness (generalized): Secondary | ICD-10-CM | POA: Diagnosis not present

## 2018-08-22 DIAGNOSIS — M6281 Muscle weakness (generalized): Secondary | ICD-10-CM | POA: Diagnosis not present

## 2018-08-22 DIAGNOSIS — R2681 Unsteadiness on feet: Secondary | ICD-10-CM | POA: Diagnosis not present

## 2018-08-22 DIAGNOSIS — R278 Other lack of coordination: Secondary | ICD-10-CM | POA: Diagnosis not present

## 2018-08-22 DIAGNOSIS — M545 Low back pain: Secondary | ICD-10-CM | POA: Diagnosis not present

## 2018-08-23 DIAGNOSIS — M6281 Muscle weakness (generalized): Secondary | ICD-10-CM | POA: Diagnosis not present

## 2018-08-23 DIAGNOSIS — M545 Low back pain: Secondary | ICD-10-CM | POA: Diagnosis not present

## 2018-08-23 DIAGNOSIS — R278 Other lack of coordination: Secondary | ICD-10-CM | POA: Diagnosis not present

## 2018-08-23 DIAGNOSIS — R2681 Unsteadiness on feet: Secondary | ICD-10-CM | POA: Diagnosis not present

## 2018-08-27 DIAGNOSIS — M545 Low back pain: Secondary | ICD-10-CM | POA: Diagnosis not present

## 2018-08-27 DIAGNOSIS — M6281 Muscle weakness (generalized): Secondary | ICD-10-CM | POA: Diagnosis not present

## 2018-08-27 DIAGNOSIS — R2681 Unsteadiness on feet: Secondary | ICD-10-CM | POA: Diagnosis not present

## 2018-08-27 DIAGNOSIS — R278 Other lack of coordination: Secondary | ICD-10-CM | POA: Diagnosis not present

## 2018-08-29 ENCOUNTER — Ambulatory Visit: Payer: Medicare Other | Admitting: Internal Medicine

## 2018-08-29 DIAGNOSIS — R2681 Unsteadiness on feet: Secondary | ICD-10-CM | POA: Diagnosis not present

## 2018-08-29 DIAGNOSIS — M545 Low back pain: Secondary | ICD-10-CM | POA: Diagnosis not present

## 2018-08-29 DIAGNOSIS — R278 Other lack of coordination: Secondary | ICD-10-CM | POA: Diagnosis not present

## 2018-08-29 DIAGNOSIS — M6281 Muscle weakness (generalized): Secondary | ICD-10-CM | POA: Diagnosis not present

## 2018-08-30 DIAGNOSIS — M545 Low back pain: Secondary | ICD-10-CM | POA: Diagnosis not present

## 2018-08-30 DIAGNOSIS — M6281 Muscle weakness (generalized): Secondary | ICD-10-CM | POA: Diagnosis not present

## 2018-08-30 DIAGNOSIS — R278 Other lack of coordination: Secondary | ICD-10-CM | POA: Diagnosis not present

## 2018-08-30 DIAGNOSIS — R2681 Unsteadiness on feet: Secondary | ICD-10-CM | POA: Diagnosis not present

## 2018-09-03 DIAGNOSIS — R278 Other lack of coordination: Secondary | ICD-10-CM | POA: Diagnosis not present

## 2018-09-03 DIAGNOSIS — R2681 Unsteadiness on feet: Secondary | ICD-10-CM | POA: Diagnosis not present

## 2018-09-03 DIAGNOSIS — M545 Low back pain: Secondary | ICD-10-CM | POA: Diagnosis not present

## 2018-09-03 DIAGNOSIS — M6281 Muscle weakness (generalized): Secondary | ICD-10-CM | POA: Diagnosis not present

## 2018-09-05 ENCOUNTER — Encounter (HOSPITAL_COMMUNITY): Payer: Self-pay | Admitting: Emergency Medicine

## 2018-09-05 ENCOUNTER — Emergency Department (HOSPITAL_COMMUNITY)
Admission: EM | Admit: 2018-09-05 | Discharge: 2018-09-05 | Disposition: A | Discharge status: Home / Self Care | Payer: Medicare Other | Attending: Emergency Medicine | Admitting: Emergency Medicine

## 2018-09-05 ENCOUNTER — Emergency Department (HOSPITAL_COMMUNITY): Payer: Medicare Other

## 2018-09-05 DIAGNOSIS — C799 Secondary malignant neoplasm of unspecified site: Secondary | ICD-10-CM | POA: Diagnosis not present

## 2018-09-05 DIAGNOSIS — Z85118 Personal history of other malignant neoplasm of bronchus and lung: Secondary | ICD-10-CM | POA: Insufficient documentation

## 2018-09-05 DIAGNOSIS — R0902 Hypoxemia: Secondary | ICD-10-CM | POA: Diagnosis not present

## 2018-09-05 DIAGNOSIS — M25552 Pain in left hip: Secondary | ICD-10-CM | POA: Diagnosis not present

## 2018-09-05 DIAGNOSIS — I4891 Unspecified atrial fibrillation: Secondary | ICD-10-CM | POA: Diagnosis not present

## 2018-09-05 DIAGNOSIS — M25551 Pain in right hip: Secondary | ICD-10-CM | POA: Diagnosis not present

## 2018-09-05 DIAGNOSIS — C786 Secondary malignant neoplasm of retroperitoneum and peritoneum: Secondary | ICD-10-CM | POA: Diagnosis not present

## 2018-09-05 DIAGNOSIS — R55 Syncope and collapse: Secondary | ICD-10-CM | POA: Diagnosis not present

## 2018-09-05 DIAGNOSIS — R531 Weakness: Secondary | ICD-10-CM | POA: Diagnosis not present

## 2018-09-05 DIAGNOSIS — I10 Essential (primary) hypertension: Secondary | ICD-10-CM | POA: Diagnosis not present

## 2018-09-05 DIAGNOSIS — C7880 Secondary malignant neoplasm of unspecified digestive organ: Secondary | ICD-10-CM | POA: Diagnosis not present

## 2018-09-05 DIAGNOSIS — Z87891 Personal history of nicotine dependence: Secondary | ICD-10-CM | POA: Insufficient documentation

## 2018-09-05 DIAGNOSIS — S3993XA Unspecified injury of pelvis, initial encounter: Secondary | ICD-10-CM | POA: Diagnosis not present

## 2018-09-05 DIAGNOSIS — R52 Pain, unspecified: Secondary | ICD-10-CM | POA: Diagnosis not present

## 2018-09-05 DIAGNOSIS — Z79899 Other long term (current) drug therapy: Secondary | ICD-10-CM | POA: Insufficient documentation

## 2018-09-05 DIAGNOSIS — R1084 Generalized abdominal pain: Secondary | ICD-10-CM | POA: Diagnosis not present

## 2018-09-05 DIAGNOSIS — C3491 Malignant neoplasm of unspecified part of right bronchus or lung: Secondary | ICD-10-CM | POA: Diagnosis not present

## 2018-09-05 DIAGNOSIS — C772 Secondary and unspecified malignant neoplasm of intra-abdominal lymph nodes: Secondary | ICD-10-CM | POA: Diagnosis not present

## 2018-09-05 LAB — URINALYSIS, ROUTINE W REFLEX MICROSCOPIC
Bilirubin Urine: NEGATIVE
Glucose, UA: NEGATIVE mg/dL
Hgb urine dipstick: NEGATIVE
Ketones, ur: NEGATIVE mg/dL
LEUKOCYTES UA: NEGATIVE
Nitrite: NEGATIVE
PROTEIN: NEGATIVE mg/dL
SPECIFIC GRAVITY, URINE: 1.013 (ref 1.005–1.030)
pH: 5 (ref 5.0–8.0)

## 2018-09-05 LAB — CBG MONITORING, ED: GLUCOSE-CAPILLARY: 66 mg/dL — AB (ref 70–99)

## 2018-09-05 LAB — CBC
HCT: 43.2 % (ref 36.0–46.0)
Hemoglobin: 13.4 g/dL (ref 12.0–15.0)
MCH: 31.4 pg (ref 26.0–34.0)
MCHC: 31 g/dL (ref 30.0–36.0)
MCV: 101.2 fL — ABNORMAL HIGH (ref 80.0–100.0)
NRBC: 0 % (ref 0.0–0.2)
PLATELETS: 245 10*3/uL (ref 150–400)
RBC: 4.27 MIL/uL (ref 3.87–5.11)
RDW: 14.1 % (ref 11.5–15.5)
WBC: 9.9 10*3/uL (ref 4.0–10.5)

## 2018-09-05 LAB — BASIC METABOLIC PANEL
ANION GAP: 7 (ref 5–15)
BUN: 9 mg/dL (ref 8–23)
CALCIUM: 8.8 mg/dL — AB (ref 8.9–10.3)
CO2: 27 mmol/L (ref 22–32)
Chloride: 105 mmol/L (ref 98–111)
Creatinine, Ser: 0.57 mg/dL (ref 0.44–1.00)
GFR calc Af Amer: >60 mL/min (ref >60)
GLUCOSE: 105 mg/dL — AB (ref 70–99)
POTASSIUM: 4 mmol/L (ref 3.5–5.1)
Sodium: 139 mmol/L (ref 135–145)

## 2018-09-05 LAB — MAGNESIUM: MAGNESIUM: 1.8 mg/dL (ref 1.7–2.4)

## 2018-09-05 MED ORDER — APIXABAN 2.5 MG PO TABS
2.5000 mg | ORAL_TABLET | Freq: Two times a day (BID) | ORAL | Status: DC
  Administered 2018-09-05: 2.5 mg via ORAL
  Filled 2018-09-05 (×2): qty 1

## 2018-09-05 MED ORDER — ACETAMINOPHEN 325 MG PO TABS: 650.0000 mg | ORAL_TABLET | Freq: Four times a day (QID) | ORAL | 0 refills | Status: AC | PRN

## 2018-09-05 MED ORDER — ACETAMINOPHEN 325 MG PO TABS
650.0000 mg | ORAL_TABLET | Freq: Once | ORAL | Status: AC
  Administered 2018-09-05: 650 mg via ORAL
  Filled 2018-09-05: qty 2

## 2018-09-05 MED ORDER — AMIODARONE HCL 200 MG PO TABS
100.0000 mg | ORAL_TABLET | Freq: Every day | ORAL | Status: DC
  Administered 2018-09-05: 100 mg via ORAL
  Filled 2018-09-05: qty 1

## 2018-09-05 MED ORDER — SODIUM CHLORIDE 0.9 % IV BOLUS
1000.0000 mL | Freq: Once | INTRAVENOUS | Status: AC
  Administered 2018-09-05: 1000 mL via INTRAVENOUS

## 2018-09-05 MED ORDER — OXYCODONE HCL 5 MG PO TABS: 2.5000 mg | ORAL_TABLET | ORAL | 0 refills | Status: DC | PRN

## 2018-09-05 MED ORDER — IOHEXOL 300 MG/ML  SOLN
100.0000 mL | Freq: Once | INTRAMUSCULAR | Status: AC | PRN
  Administered 2018-09-05: 100 mL via INTRAVENOUS

## 2018-09-05 NOTE — Discharge Planning (Signed)
Mirabelle Cyphers J. Clydene Laming, RN, BSN, [employer] [phone] Spoke with pt at bedside regarding discharge planning for [facility]. Offered pt list of home health agencies to choose from.  Pt chose [facility] to render services. Tiffany of [facility]  notified. Patient made aware that [facility]  will be in contact in 24-48 hours.  No DME needs identified at this time.

## 2018-09-05 NOTE — ED Notes (Signed)
Dr. Dayna Barker made aware of patient, MD at bedside at this time

## 2018-09-05 NOTE — ED Notes (Signed)
Patient able to use bedside commode with assistance.  Patient continues to be unsteady on her feet.  Daughter-in-law states this is "normal" for patient

## 2018-09-05 NOTE — ED Notes (Signed)
Patient transported to CT 

## 2018-09-05 NOTE — ED Notes (Signed)
culture sent with UA

## 2018-09-05 NOTE — ED Triage Notes (Signed)
Pt arrives via [facility] from [place] where she was being seen for RLQ pain-cramping/sharp-radiation to R and L flank-pt had syncopal episode at PCP office and began feeling dizzy- found to be hypertensive 173/66, HR 77, 95% RA, CBG 96.  recent fall a few weeks ago, is able to ambulate with her walker but is weaker than her baseline. Pt a/ox4, resp e/u, nad. Speech clear, face symmetrical.

## 2018-09-05 NOTE — Discharge Planning (Signed)
   Home health agencies that serve [place], [place]. Your favorite home health agencies  [place] of Patient Care Rating Patient Survey Summary Rating  [facility]  [phone]

## 2018-09-05 NOTE — ED Notes (Signed)
Patient walking as 1-assist, per baseline, on discharge.

## 2018-09-05 NOTE — ED Notes (Signed)
Pt awaiting case management consult prior to d/c.

## 2018-09-06 ENCOUNTER — Other Ambulatory Visit: Payer: Medicare Other

## 2018-09-06 ENCOUNTER — Telehealth: Payer: Self-pay | Admitting: *Deleted

## 2018-09-06 DIAGNOSIS — C3411 Malignant neoplasm of upper lobe, right bronchus or lung: Secondary | ICD-10-CM

## 2018-09-06 NOTE — Telephone Encounter (Signed)
Oncology Nurse Navigator Documentation  Oncology Nurse Navigator Flowsheets 09/06/2018  Navigator Location [facility]  Navigator Encounter Type Telephone/I received a referral for patient to see Dr. Julien Nordmann again.  I called and spoke with patient's husband and update him on appt. He verbalized understanding of appt   Telephone Outgoing Call  Treatment Phase Abnormal Scans  Barriers/Navigation Needs Education;Coordination of Care  Education Other  Interventions Coordination of Care;Education  Coordination of Care Appts  Education Method Verbal  Acuity Level 2  Time Spent with Patient 30

## 2018-09-07 NOTE — ED Provider Notes (Signed)
[facility] EMERGENCY DEPARTMENT Provider Note   CSN: [id number] Arrival date & time: 09/05/18  0931     History   Chief Complaint Chief Complaint  Patient presents with  . Loss of Consciousness  . Flank Pain    HPI Jodi Burns is a 85 y.o. female.   Loss of Consciousness  Episode history:  Single Most recent episode:  Today Duration:  2 minutes Timing:  Constant Progression:  Resolved Chronicity:  New Context: standing up   Witnessed: yes   Worsened by:  Nothing Ineffective treatments:  None tried Associated symptoms: weakness   Associated symptoms: no anxiety and no shortness of breath   Flank Pain  Pertinent negatives include no shortness of breath.    Past Medical History:  Diagnosis Date  . Allergic rhinitis   . Arthritis    "the normal for my age" (10/03/2016)  . Asthma    "very mild" (10/03/2016)  . Atrial fibrillation ([place])   . Chronic lower back pain   . Dyspnea on exertion   . History of blood transfusion    "I was young; don't remember why I needed it"  . History of external beam radiation therapy 10/31/16-11/13/16   left upper lung treated to 54 Gy in 3 fractions, right upper lung to 50 Gy in 10 fractions  . 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 On home oxygen therapy    "available; not needed it yet" (10/03/2016)  . Osteopenia   . Other and unspecified hyperlipidemia   . Other retinal disorders(362.89)   . Personal history of other diseases of circulatory system   . Personal history of venous thrombosis and embolism 4/94  . Reactive airway disease   . Subjective visual disturbance, unspecified   . Vitamin D deficiency     Patient Active Problem List   Diagnosis Date Noted  . Chronic respiratory failure with hypoxia ([place]) 05/27/2018  . Goals of care, counseling/discussion 11/07/2016  . Primary cancer of right upper lobe of lung ([place]) 10/23/2016  . Mass of  upper lobe of left lung 10/23/2016  . Chest tube in place   . Pneumothorax after biopsy 10/03/2016  . Asthma 10/03/2016  . Other abnormalities of gait and mobility   . Atypical atrial flutter ([place])   . Elevated troponin 08/21/2016  . COPD GOLD II 08/21/2016  . Atrial fibrillation with RVR ([place]) 08/20/2016  . Lung mass 08/20/2016  . Essential hypertension 11/01/2013  . Abnormal PFTs (pulmonary function tests) 11/01/2013  . Hyperlipidemia with target LDL less than 130 05/19/2008  . VISUAL IMPAIRMENT 05/19/2008  . Allergic rhinitis due to pollen 05/19/2008  . 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 PULMONARY EMBOLISM, HX OF 05/19/2008  . PAF (paroxysmal atrial fibrillation) ([place]) 05/19/2008    Past Surgical History:  Procedure Laterality Date  . APPENDECTOMY    . BREAST BIOPSY Bilateral   . CARDIOVASCULAR STRESS TEST  August 2013   CPET-MET: Submaximal effort, did not reach anaerobic threshold --> peak VO2 76% (not overly concerning for CAD)  . CARDIOVERSION N/A 08/29/2016   Procedure: CARDIOVERSION;  Surgeon: Sanda Klein, MD;  Location: [facility];  Service: Cardiovascular;  Laterality: N/A;  . CARPAL TUNNEL RELEASE Bilateral 2006  . GLAUCOMA SURGERY Bilateral   . PERINEOPLASTY     and rectocele  . Pulmonary Function Test  August 2013   CPET-PFTs: FVC 58%, FEV1 48%; breathing reserve less than 10%, normal DLCO; reduced  vital capacity -- suggesting obstructive lung disease  . SHOULDER ARTHROSCOPY W/ ROTATOR CUFF REPAIR  7/04, 2003; ?date   right, "left side was done a 2nd time"  . TEE WITHOUT CARDIOVERSION N/A 08/29/2016   Procedure: TRANSESOPHAGEAL ECHOCARDIOGRAM (TEE);  Surgeon: Sanda Klein, MD;  Location[facility] ENDOSCOPY;  Service: Cardiovascular;  Laterality: N/A;  . TONSILLECTOMY    . TOTAL ABDOMINAL HYSTERECTOMY    . TRANSTHORACIC ECHOCARDIOGRAM  February 2011   EF 55-60%. Grade 1 diastolic dysfunction/relaxation abnormality. Mild MR     OB History    Gravida  2    Para  2   Term      Preterm      AB      Living  2     SAB      TAB      Ectopic      Multiple      Live Births               Home Medications    Prior to Admission medications   Medication Sig Start Date End Date Taking? Authorizing Provider  amiodarone (PACERONE) 200 MG tablet Take 0.5 tablets (100 mg total) by mouth daily. 12/06/17  Yes Camnitz, Ocie Doyne, MD  apixaban (ELIQUIS) 2.5 MG TABS tablet Take 1 tablet (2.5 mg total) by mouth 2 (two) times daily. 08/25/16  Yes Nita Sells, MD  Cholecalciferol (VITAMIN D3) 5000 units TABS Take 2 tablets by mouth daily.   Yes [provider]  Coenzyme Q10 (COQ10) 200 MG CAPS Take 1 capsule by mouth daily.    Yes [provider]  cyanocobalamin 1000 MCG tablet Take 2,000 mcg by mouth daily.   Yes [provider]  dorzolamide-timolol (COSOPT) 22.3-6.8 MG/ML ophthalmic solution Place 1 drop into both eyes 2 (two) times daily.     Yes [provider]  fluocinonide (LIDEX) 0.05 % external solution Apply 1 application topically once a week. scalp 07/27/16  Yes [provider]  ipratropium (ATROVENT) 0.03 % nasal spray 1-2 puffs each nostril every 8 hours if needed Patient taking differently: Place 1-2 sprays into both nostrils every 8 (eight) hours as needed (nasal congestion).  01/20/16  Yes Young, Tarri Fuller D, MD  loratadine (CLARITIN) 10 MG tablet Take 10 mg by mouth daily as needed for allergies.    Yes [provider]  Multiple Vitamins-Minerals (PRESERVISION/LUTEIN) CAPS Take 1 capsule by mouth 2 (two) times daily.     Yes [provider]  PROAIR HFA 108 (90 Base) MCG/ACT inhaler Inhale 2 puffs into the lungs every 6 (six) hours as needed for wheezing or shortness of breath.  08/18/16  Yes [provider]  SYMBICORT 160-4.5 MCG/ACT inhaler inhale 2 puffs by mouth twice a day Rinse mouth after use Patient taking differently: Inhale 2 puffs into the  lungs 2 (two) times daily.  05/16/17  Yes Young, Tarri Fuller D, MD  acetaminophen (TYLENOL) 325 MG tablet Take 2 tablets (650 mg total) by mouth every 6 (six) hours as needed. 09/05/18   Vaniyah Lansky, Corene Cornea, MD  oxyCODONE (ROXICODONE) 5 MG immediate release tablet Take 0.5-1 tablets (2.5-5 mg total) by mouth every 4 (four) hours as needed for severe pain. 09/05/18   Jlon Betker, Corene Cornea, MD  OXYGEN 2LPM     [provider]    Family History Family History  Problem Relation Age of Onset  . Osteoporosis Mother   . Diabetes Paternal Aunt     Social History Social History   Tobacco  Use  . Smoking status: Former Smoker    Packs/day: 0.75    Years: 25.00    Pack years: 18.75    Types: Cigarettes    Last attempt to quit: 1969    Years since quitting: 51.0  . Smokeless tobacco: Never Used  . Tobacco comment: 3/4 ppd 1946-1969 (17.5 pack years)  Substance Use Topics  . Alcohol use: Yes    Alcohol/week: 7.0 standard drinks    Types: 3 Glasses of wine, 4 Shots of liquor per week    Comment: 10/03/2016 "high ball or glass of wine prior to dinner"  . Drug use: No     Allergies   Cefaclor and Codeine   Review of Systems Review of Systems  Respiratory: Negative for shortness of breath.   Cardiovascular: Positive for syncope.  Genitourinary: Positive for flank pain.  Neurological: Positive for weakness.  All other systems reviewed and are negative.    Physical Exam Updated Vital Signs BP (!) 188/95 (BP Location: Right Arm)   Pulse 94   Temp 97.7 F (36.5 C) (Oral)   Resp 17   LMP 08/29/1967   SpO2 100%   Physical Exam Vitals signs and nursing note reviewed.  Constitutional:      Appearance: She is well-developed.  HENT:     Head: Normocephalic and atraumatic.     Nose: Nose normal.     Mouth/Throat:     Mouth: Mucous membranes are dry.     Pharynx: Oropharynx is clear.  Eyes:     Extraocular Movements: Extraocular movements intact.     Conjunctiva/sclera: Conjunctivae normal.       Pupils: Pupils are equal, round, and reactive to light.  Neck:     Musculoskeletal: Normal range of motion.  Cardiovascular:     Rate and Rhythm: Normal rate and regular rhythm.  Pulmonary:     Effort: No respiratory distress.     Breath sounds: No stridor.  Abdominal:     General: There is no distension.     Tenderness: There is abdominal tenderness (RLQ).  Musculoskeletal:        General: Tenderness (midline mid thoracic area) present.  Skin:    General: Skin is warm and dry.     Coloration: Skin is not jaundiced.  Neurological:     General: No focal deficit present.     Mental Status: She is alert.      ED Treatments / Results  Labs (all labs ordered are listed, but only abnormal results are displayed) Labs Reviewed  BASIC METABOLIC PANEL - Abnormal; Notable for the following components:      Result Value   Glucose, Bld 105 (*)    Calcium 8.8 (*)    All other components within normal limits  CBC - Abnormal; Notable for the following components:   MCV 101.2 (*)    All other components within normal limits  CBG MONITORING, ED - Abnormal; Notable for the following components:   Glucose-Capillary 66 (*)    All other components within normal limits  URINALYSIS, ROUTINE W REFLEX MICROSCOPIC  MAGNESIUM    EKG EKG Interpretation  Date/Time:  Thursday September 05 2018 09:41:00 EST Ventricular Rate:  88 PR Interval:  178 QRS Duration: 80 QT Interval:  398 QTC Calculation: 481 R Axis:   -50 Text Interpretation:  Normal sinus rhythm Left axis deviation Minimal voltage criteria for LVH, may be normal variant Anteroseptal infarct , age undetermined Abnormal ECG No significant change since last tracing Confirmed by  Oniyah Rohe, Corene Cornea [phone]) on 09/05/2018 9:54:28 AM   Radiology Ct Abdomen Pelvis W Contrast  Result Date: 09/05/2018 CLINICAL DATA:  Generalized abdominal pain. History of lung cancer. EXAM: CT ABDOMEN AND PELVIS WITH CONTRAST TECHNIQUE: Multidetector CT imaging  of the abdomen and pelvis was performed using the standard protocol following bolus administration of intravenous contrast. CONTRAST:  1108m OMNIPAQUE IOHEXOL 300 MG/ML  SOLN COMPARISON:  Pelvic radiography same day FINDINGS: Lower chest: Right pleural effusion or pleural thickening. Small nodules along the right diaphragmatic surface worrisome in this clinical setting for metastatic disease. Hepatobiliary: No liver parenchymal lesion is seen. No calcified gallstones. Small amount intraperitoneal fluid along the margin of the liver. Pancreas: Normal Spleen: Normal. Insignificant calcified granulomas. Adrenals/Urinary Tract: Adrenal glands are normal. Kidneys are normal. Bladder is normal. Stomach/Bowel: No primary bowel pathology is evident. Vascular/Lymphatic: Aortic atherosclerosis. No aneurysm. IVC is normal. No retroperitoneal lymphadenopathy. Reproductive: Previous hysterectomy. Cystic abnormality of the left ovary measuring up to 5 cm in diameter. This could be a benign or malignant cystic mass. Other: Multiple nodules along the peritoneal surface throughout the abdomen including in the pelvic cul de sac region and extensively within the omentum. This consistent with widely metastatic carcinoma. Musculoskeletal: Compression fractures at T10 and L2, similar to the study of 06/20/2018. These have not progressed but may not be completely healed. They could be benign or pathologic fractures. IMPRESSION: 1. Widespread metastatic disease throughout the peritoneal surface throughout the abdomen and pelvis including in the pelvic cul de sac region and extensively within the omentum. 2. 5 cm cystic abnormality of the left ovary. This could be a benign or malignant cystic mass. 3. Right pleural effusion or pleural thickening. Small nodules along the right diaphragmatic surface worrisome for metastatic disease. 4. Compression fractures at T10 and L2, similar to the study of 06/20/2018. These could be benign or pathologic  fractures. 5. As far as the origin the metastatic disease, differential considerations include the patient's known lung cancer, the left ovarian lesion and occult gastrointestinal malignancy. Electronically Signed   By: MNelson ChimesM.D.   On: 09/05/2018 12:44   Dg Pelvis Portable  Result Date: 09/05/2018 CLINICAL DATA:  Bilateral hip pain post fall. EXAM: PORTABLE PELVIS 1-2 VIEWS COMPARISON:  None. FINDINGS: There is no evidence of pelvic fracture or diastasis. No pelvic bone lesions are seen. IMPRESSION: Negative. Electronically Signed   By: DFidela SalisburyM.D.   On: 09/05/2018 10:23    Procedures Procedures (including critical care time)  Medications Ordered in ED Medications  sodium chloride 0.9 % bolus 1,000 mL (0 mLs Intravenous Stopped 09/05/18 1125)  acetaminophen (TYLENOL) tablet 650 mg (650 mg Oral Given 09/05/18 1155)  iohexol (OMNIPAQUE) 300 MG/ML solution 100 mL (100 mLs Intravenous Contrast Given 09/05/18 1225)     Initial Impression / Assessment and Plan / ED Course  I have reviewed the triage vital signs and the nursing notes.  Pertinent labs & imaging results that were available during my care of the patient were reviewed by me and considered in my medical decision making (see chart for details).     Patient with what appears to be diffuse metastatic disease of unknown origin.  Also new cystic mass on her ovary.  Discussed with Dr. MEarlie Serverwho will see her in follow-up.  Also discussed with her primary doctor, Dr. PAbner Greenspan who will also see in follow-up.  Discussed with her and her husband and daughter at bedside all who understand and will help her with follow-up.  Final Clinical Impressions(s) / ED Diagnoses   Final diagnoses:  Generalized abdominal pain  Metastatic cancer [facility])    ED Discharge Orders         Ordered    acetaminophen (TYLENOL) 325 MG tablet  Every 6 hours PRN     09/05/18 1422    oxyCODONE (ROXICODONE) 5 MG immediate release tablet  Every 4  hours PRN     09/05/18 [facility]     09/05/18 1429    Face-to-face encounter (required for Medicare/Medicaid patients)    Comments:  I Merrily Pew certify that this patient is under my care and that I, or a nurse practitioner or physician's assistant working with me, had a face-to-face encounter that meets the physician face-to-face encounter requirements with this patient on 09/05/2018. The encounter with the patient was in whole, or in part for the following medical condition(s) which is the primary reason for home health care (List medical condition): worsening metastatic cancer.with increasing weakness and near syncopal episodes. furthe rorders per pcp.   09/05/18 1429           Chaise Passarella, Corene Cornea, MD 09/07/18 4098

## 2018-09-11 ENCOUNTER — Ambulatory Visit: Payer: Medicare Other | Admitting: Internal Medicine

## 2018-09-11 ENCOUNTER — Other Ambulatory Visit: Payer: Medicare Other

## 2018-09-12 ENCOUNTER — Other Ambulatory Visit: Payer: Self-pay | Admitting: Radiation Oncology

## 2018-09-12 ENCOUNTER — Inpatient Hospital Stay: Payer: Medicare Other | Admitting: Internal Medicine

## 2018-09-12 ENCOUNTER — Other Ambulatory Visit: Payer: Self-pay | Admitting: *Deleted

## 2018-09-12 MED ORDER — OXYCODONE HCL 5 MG PO TABS: 2.5000 mg | ORAL_TABLET | ORAL | 0 refills | Status: DC | PRN

## 2018-09-12 NOTE — Progress Notes (Signed)
The proposed treatment discussed in cancer conference 09/12/2018 is for discussion purpose only and is not a binding recommendation.  Patient was not physically examined nor present for their treatment options.  Therefore, final treatment plans cannot be decided.

## 2018-09-16 ENCOUNTER — Encounter: Payer: Self-pay | Admitting: Hematology and Oncology

## 2018-09-16 ENCOUNTER — Ambulatory Visit: Payer: Medicare Other | Admitting: Hematology and Oncology

## 2018-09-16 ENCOUNTER — Telehealth: Payer: Self-pay

## 2018-09-16 ENCOUNTER — Telehealth: Payer: Self-pay | Admitting: Hematology and Oncology

## 2018-09-16 ENCOUNTER — Inpatient Hospital Stay: Payer: Medicare Other | Attending: Hematology and Oncology | Admitting: Hematology and Oncology

## 2018-09-16 VITALS — BP 133/115 | HR 100 | Temp 97.5°F | Resp 18 | Ht 62.5 in

## 2018-09-16 DIAGNOSIS — Z7189 Other specified counseling: Secondary | ICD-10-CM

## 2018-09-16 DIAGNOSIS — Z79899 Other long term (current) drug therapy: Secondary | ICD-10-CM | POA: Insufficient documentation

## 2018-09-16 DIAGNOSIS — R110 Nausea: Secondary | ICD-10-CM | POA: Diagnosis not present

## 2018-09-16 DIAGNOSIS — C786 Secondary malignant neoplasm of retroperitoneum and peritoneum: Secondary | ICD-10-CM | POA: Insufficient documentation

## 2018-09-16 DIAGNOSIS — R42 Dizziness and giddiness: Secondary | ICD-10-CM | POA: Insufficient documentation

## 2018-09-16 DIAGNOSIS — G893 Neoplasm related pain (acute) (chronic): Secondary | ICD-10-CM | POA: Insufficient documentation

## 2018-09-16 DIAGNOSIS — R140 Abdominal distension (gaseous): Secondary | ICD-10-CM | POA: Diagnosis not present

## 2018-09-16 DIAGNOSIS — Z7901 Long term (current) use of anticoagulants: Secondary | ICD-10-CM | POA: Insufficient documentation

## 2018-09-16 DIAGNOSIS — R54 Age-related physical debility: Secondary | ICD-10-CM | POA: Diagnosis not present

## 2018-09-16 DIAGNOSIS — M545 Low back pain: Secondary | ICD-10-CM | POA: Diagnosis not present

## 2018-09-16 DIAGNOSIS — C801 Malignant (primary) neoplasm, unspecified: Secondary | ICD-10-CM

## 2018-09-16 DIAGNOSIS — C3411 Malignant neoplasm of upper lobe, right bronchus or lung: Secondary | ICD-10-CM | POA: Diagnosis not present

## 2018-09-16 DIAGNOSIS — R64 Cachexia: Secondary | ICD-10-CM | POA: Insufficient documentation

## 2018-09-16 MED ORDER — DEXAMETHASONE 1 MG PO TABS: 1.0000 mg | ORAL_TABLET | Freq: Every day | ORAL | 1 refills | Status: AC

## 2018-09-16 NOTE — Assessment & Plan Note (Signed)
We have extensive goals of care discussion The patient has expressed desire to just feel a little better.  She is too frail with poor baseline performance status. I do not recommend aggressive intervention with imaging study or biopsy as she is unlikely able to tolerate chemotherapy We discussed palliative care/hospice referral and she agreed to proceed

## 2018-09-16 NOTE — Assessment & Plan Note (Signed)
She tolerated pain medicine poorly We discussed other form of narcotic prescription I recommend a trial of low-dose dexamethasone that might help with her energy, appetite, inflammatory pain and symptoms with nausea She agreed with the plan of care I recommend she takes in the morning with food I will call her family or husband in 3 days time to assess symptom management

## 2018-09-16 NOTE — Assessment & Plan Note (Signed)
She has profound signs and symptoms of carcinomatosis. I have reviewed recent CT scan of the abdomen and pelvis with the patient and family members and comparing that with her prior PET CT scan Primary ovarian cancer cannot be excluded.  She had profound symptoms of pain, bloating and changes of bowel habits We discussed the risk and benefit of pursuing biopsy I do not recommend aggressive intervention as she is too frail to tolerate palliative treatment I recommend conservative management with pain control for now

## 2018-09-16 NOTE — Assessment & Plan Note (Signed)
She had biopsy-proven lung cancer status post palliative radiation therapy. I have reviewed multiple imaging study with the patient and family Her most recent imaging study showed diffuse metastatic disease.  Her last CT imaging of the chest also show evidence of either progression of fibrosis from radiation versus disease progression She is very frail and I do not believe she can tolerate palliative chemotherapy. We discussed the risk and benefits of PET CT scan or biopsy and at present time, the patient and family members are not interested to pursue further investigation I recommend palliative care/hospice referral

## 2018-09-16 NOTE — Telephone Encounter (Signed)
Husband called and left a message. They have decided on a hospice referral and ask that it be sent to [facility].  Called referral to [facility].

## 2018-09-16 NOTE — Progress Notes (Signed)
[facility] OFFICE PROGRESS NOTE  Patient Care Team: Crist Infante, MD as PCP - General (Internal Medicine) Constance Haw, MD as PCP - Cardiology (Cardiology)  ASSESSMENT & PLAN:  Primary cancer of right upper lobe of lung [facility]) She had biopsy-proven lung cancer status post palliative radiation therapy. I have reviewed multiple imaging study with the patient and family Her most recent imaging study showed diffuse metastatic disease.  Her last CT imaging of the chest also show evidence of either progression of fibrosis from radiation versus disease progression She is very frail and I do not believe she can tolerate palliative chemotherapy. We discussed the risk and benefits of PET CT scan or biopsy and at present time, the patient and family members are not interested to pursue further investigation I recommend palliative care/hospice referral  Peritoneal carcinomatosis [facility]) She has profound signs and symptoms of carcinomatosis. I have reviewed recent CT scan of the abdomen and pelvis with the patient and family members and comparing that with her prior PET CT scan Primary ovarian cancer cannot be excluded.  She had profound symptoms of pain, bloating and changes of bowel habits We discussed the risk and benefit of pursuing biopsy I do not recommend aggressive intervention as she is too frail to tolerate palliative treatment I recommend conservative management with pain control for now  Cancer associated pain She tolerated pain medicine poorly We discussed other form of narcotic prescription I recommend a trial of low-dose dexamethasone that might help with her energy, appetite, inflammatory pain and symptoms with nausea She agreed with the plan of care I recommend she takes in the morning with food I will call her family or husband in 3 days time to assess symptom management  Malignant cachexia ([place]) 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  She is too frail with poor baseline performance status. I do not recommend aggressive intervention with imaging study or biopsy as she is unlikely able to tolerate chemotherapy We discussed palliative care/hospice referral and she agreed to proceed   No orders of the defined types were placed in this encounter.   INTERVAL HISTORY: Please see below for problem oriented charting. She is seen here today after referral from the thoracic clinic. I have reviewed her records extensively. Her husband, Glendell Docker, is a retired Lexicographer and he is present today Her daughter, Arbie Cookey, is here by her side, recently traveled from [place].  Her son is not present. 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  She was diagnosed with lung cancer 2 years ago and had palliative radiation therapy.  She did not undergo any form of systemic treatment. The last time she was seen at the lung cancer clinic was over a year ago  She is weak and is prone to fall.  She had a fall around Christmas time, complicated by significant lower back pain.  She described her pain as sharp, intermittent in nature, usually in the suprapubic region in the tailbone region. It was originally thought that the pain was due to a fall but due to unresolved symptoms, she subsequently underwent CT imaging which showed abnormalities in her abdomen. She was subsequently referred here to the GYN oncology clinic for further evaluation. She was prescribed low-dose oxycodone but she did not tolerate that well,  complicated by hallucination.  She is currently taking only Tylenol for pain control and it is not helpful She has significant nausea, bloating and intermittent diarrhea.  She felt weak overall. Denies abnormal vaginal  bleeding.  She is prone to get recurrent syncopal episode due to poor vision that was related to prior history of histoplasmosis.   She is weak and dizzy when she tries to walk.  She needs help in all activities of daily living. She spends more than 50% of her time resting in a chair or bed. She is currently residing in a retirement community in [place]  I have reviewed her records and summarized her records as follows: SUMMARY OF ONCOLOGIC HISTORY: Oncology History   Adenocarcinoma  PD-L1 10% KDR, Kit, PDGFRA amplications are found MSI stable Tumor burden high     Primary cancer of right upper lobe of lung ([place])   09/15/2016 PET scan    1. Mass in the right upper lobe measures 4 cm and exhibits intense FDG uptake, suspicious for primary bronchogenic carcinoma. 2. Small nodule in the left upper lobe exhibits low level malignant range FDG uptake and is worrisome for primary lung neoplasm. 3. No evidence for mediastinal or hilar nodal metastasis or distant metastatic disease. 4. Emphysema 5. Aortic atherosclerosis and coronary artery calcification. 6. Small right pleural effusion. Overlying airspace consolidation within the right lower lobe concerning for pneumonia and/or aspiration. 7. Prior granulomatous disease.     10/03/2016 Pathology Results    Lung, needle/core biopsy(ies), Right Upper Lobe - ADENOCARCINOMA. - SEE COMMENT. Microscopic Comment The malignant cells are positive for Napsin-A and TTF-1. They are negative for p63 and cytokeratin 5/6. The findings are consistent with primary lung adenocarcinoma. Additional studies can be performed upon clinician request    10/03/2016 Procedure    1. Technically successful CT guided core needle core biopsy of right upper lobe pulmonary mass biopsy. 2. Procedure complicated by development of a slowly enlarging though asymptomatic pneumothorax necessitating placement of a right-sided chest tube.    10/16/2016 Imaging    MRI  brain 1. No evidence for metastatic disease to the brain. 2. Dural-based mass lesion along the tentorium is compatible with a meningioma.    10/31/2016 - 11/13/2016 Radiation Therapy    10/31/2016 - 11/13/2016 SBRT  Radiotherapy: Left upper lung treated to 54 Gy at 3 fractions. Right upper lung to 50 Gy at 10 fractions.     02/05/2017 Imaging    Ct chest 1. Mixed treatment response. Right upper lobe pulmonary nodule has decreased in size. Left upper lobe pulmonary nodule has increased in size and density. 2. New patchy ground-glass attenuation in the upper lobes bilaterally is compatible with postradiation change.  3. New tiny 3 mm right lower lobe solid pulmonary nodule, which warrants attention on follow-up chest CT in 3-6 months, metastasis not excluded. 4. No thoracic adenopathy. 5. Stable small dependent right pleural effusion. 6. Left main and two-vessel coronary atherosclerosis.  Aortic Atherosclerosis (ICD10-I70.0) and Emphysema (ICD10-J43.9).    05/16/2017 Imaging    Ct chest 1. The dominant lung mass within the right upper lobe demonstrates mild decrease in size in the interval. 2. No significant change and left upper lobe index lesion although there has been an increase in surrounding masslike architectural distortion which is favored to represent sequelae of external beam radiation. 3. Persistent right pleural effusion 4. New mild superior endplate compression deformity involving the T11 vertebra.    08/16/2017 Imaging    1. Similar-appearing mass within the  right upper lobe. 2. No significant change in left upper lobe lesion. 3. Re- demonstrated mild T11 wedge compression deformity involving the superior endplate. 4. Aortic atherosclerosis.     09/14/2017 Imaging    1. Right extraforaminal disc extrusion at L2-3 with L2 nerve impingement. 2. Moderate multifactorial spinal stenosis at L2-3. 3. Mild spinal stenosis at L4-5. 4. Mild bilateral neural foraminal stenosis at  L5-S1. Displacement of the extraforaminal right L5 nerve by a large osteophyte.     11/14/2017 Imaging    Ct chest 1. Stable exam without new or progressive interval findings. Specifically, bilateral upper lobe lesions show no appreciable change. 2.  Aortic Atherosclerois (ICD10-170.0)    03/18/2018 Imaging    Ct chest Increased size of masslike opacities in both upper lobes, which may be due to radiation pneumonitis or progressive carcinoma. Recommend continued follow-up by chest CT in 3 months.  No evidence lymphadenopathy or pleural effusion.  Aortic Atherosclerosis (ICD10-I70.0). Coronary artery calcification.    06/20/2018 Imaging    Ct scan of chest 1. Similar appearance of bilateral upper lobe areas of masslike consolidation and architectural distortion, favored to be radiation induced. 2. No thoracic adenopathy. 3. Aortic atherosclerosis (ICD10-I70.0), coronary artery atherosclerosis and emphysema (ICD10-J43.9). 4. Left sided solid and ground-glass nodules are not significantly changed.    09/05/2018 Imaging    CT scan of abdomen and pelvis 1. Widespread metastatic disease throughout the peritoneal surface throughout the abdomen and pelvis including in the pelvic cul de sac region and extensively within the omentum. 2. 5 cm cystic abnormality of the left ovary. This could be a benign or malignant cystic mass. 3. Right pleural effusion or pleural thickening. Small nodules along the right diaphragmatic surface worrisome for metastatic disease. 4. Compression fractures at T10 and L2, similar to the study of 06/20/2018. These could be benign or pathologic fractures. 5. As far as the origin the metastatic disease, differential considerations include the patient's known lung cancer, the left ovarian lesion and occult gastrointestinal malignancy.     09/16/2018 Cancer Staging    Staging form: Lung, AJCC 8th Edition - Clinical: Stage IV (rcT2a(m), cN0, cM1) - Signed by Heath Lark,  MD on 09/16/2018     REVIEW OF SYSTEMS:   Constitutional: Denies fevers, chills Eyes: Denies blurriness of vision Ears, nose, mouth, throat, and face: Denies mucositis or sore throat Respiratory: Denies cough, dyspnea or wheezes Cardiovascular: Denies palpitation, chest discomfort or lower extremity swelling Skin: Denies abnormal skin rashes Lymphatics: Denies new lymphadenopathy or easy bruising Behavioral/Psych: Mood is stable, no new changes  All other systems were reviewed with the patient and are negative.  I have reviewed the past medical history, past surgical history, social history and family history with the patient and they are unchanged from previous note.  ALLERGIES:  is allergic to cefaclor and codeine.  MEDICATIONS:  Current Outpatient Medications  Medication Sig Dispense Refill  . acetaminophen (TYLENOL) 325 MG tablet Take 2 tablets (650 mg total) by mouth every 6 (six) hours as needed. 30 tablet 0  . amiodarone (PACERONE) 200 MG tablet Take 0.5 tablets (100 mg total) by mouth daily. 45 tablet 3  . apixaban (ELIQUIS) 2.5 MG TABS tablet Take 1 tablet (2.5 mg total) by mouth 2 (two) times daily. 60 tablet 0  . Cholecalciferol (VITAMIN D3) 5000 units TABS Take 2 tablets by mouth daily.    . Coenzyme Q10 (COQ10) 200 MG CAPS Take 1 capsule by mouth daily.     . cyanocobalamin 1000 MCG  tablet Take 2,000 mcg by mouth daily.    Marland Kitchen dexamethasone (DECADRON) 1 MG tablet Take 1 tablet (1 mg total) by mouth daily. 30 tablet 1  . dorzolamide-timolol (COSOPT) 22.3-6.8 MG/ML ophthalmic solution Place 1 drop into both eyes 2 (two) times daily.      . fluocinonide (LIDEX) 0.05 % external solution Apply 1 application topically once a week. scalp  1  . ipratropium (ATROVENT) 0.03 % nasal spray 1-2 puffs each nostril every 8 hours if needed (Patient taking differently: Place 1-2 sprays into both nostrils every 8 (eight) hours as needed (nasal congestion). ) 30 mL 12  . loratadine (CLARITIN)  10 MG tablet Take 10 mg by mouth daily as needed for allergies.     . Multiple Vitamins-Minerals (PRESERVISION/LUTEIN) CAPS Take 1 capsule by mouth 2 (two) times daily.      . OXYGEN 2LPM     . PROAIR HFA 108 (90 Base) MCG/ACT inhaler Inhale 2 puffs into the lungs every 6 (six) hours as needed for wheezing or shortness of breath.   0  . SYMBICORT 160-4.5 MCG/ACT inhaler inhale 2 puffs by mouth twice a day Rinse mouth after use (Patient taking differently: Inhale 2 puffs into the lungs 2 (two) times daily. ) 10.2 g 0   No current facility-administered medications for this visit.     PHYSICAL EXAMINATION: ECOG PERFORMANCE STATUS: 3 - Symptomatic, >50% confined to bed  Vitals:   09/16/18 1118  BP: (!) 133/115  Pulse: 100  Resp: 18  Temp: (!) 97.5 F (36.4 C)  SpO2: 98%   Filed Weights    GENERAL:alert, she looks frail, sitting on the wheelchair, she looks thin and mildly cachectic SKIN: skin color, texture, turgor are normal, no rashes or significant lesions.  Noted some mild skin bruises EYES: normal, Conjunctiva are pink and non-injected, sclera clear OROPHARYNX:no exudate, no erythema and lips, buccal mucosa, and tongue normal  NECK: supple, thyroid normal size, non-tender, without nodularity LYMPH:  no palpable lymphadenopathy in the cervical, axillary or inguinal LUNGS: clear to auscultation and percussion with normal breathing effort HEART: regular rate & rhythm and no murmurs with minimum trace bilateral lower extremity edema ABDOMEN:abdomen soft, mild tenderness on palpation with active bowel sounds.  No rebound no guarding Musculoskeletal:no cyanosis of digits and no clubbing  NEURO: alert & oriented x 3 with fluent speech, no focal motor/sensory deficits  LABORATORY DATA:  I have reviewed the data as listed    Component Value Date/Time   NA 139 09/05/2018 0953   K 4.0 09/05/2018 0953   CL 105 09/05/2018 0953   CO2 27 09/05/2018 0953   GLUCOSE 105 (H) 09/05/2018 0953    BUN 9 09/05/2018 0953   CREATININE 0.57 09/05/2018 0953   CREATININE 0.70 06/20/2018 1231   CALCIUM 8.8 (L) 09/05/2018 0953   PROT 6.5 05/17/2018 1522   ALBUMIN 3.9 05/17/2018 1522   AST 14 05/17/2018 1522   ALT 9 05/17/2018 1522   ALKPHOS 82 05/17/2018 1522   BILITOT <0.2 05/17/2018 1522   GFRNONAA >60 09/05/2018 0953   GFRNONAA >60 06/20/2018 1231   GFRAA >60 09/05/2018 0953   GFRAA >60 06/20/2018 1231    No results found for: SPEP, UPEP  Lab Results  Component Value Date   WBC 9.9 09/05/2018   HGB 13.4 09/05/2018   HCT 43.2 09/05/2018   MCV 101.2 (H) 09/05/2018   PLT 245 09/05/2018      Chemistry      Component Value Date/Time  NA 139 09/05/2018 0953   K 4.0 09/05/2018 0953   CL 105 09/05/2018 0953   CO2 27 09/05/2018 0953   BUN 9 09/05/2018 0953   CREATININE 0.57 09/05/2018 0953   CREATININE 0.70 06/20/2018 1231      Component Value Date/Time   CALCIUM 8.8 (L) 09/05/2018 0953   ALKPHOS 82 05/17/2018 1522   AST 14 05/17/2018 1522   ALT 9 05/17/2018 1522   BILITOT <0.2 05/17/2018 1522       RADIOGRAPHIC STUDIES: I have personally reviewed the radiological images as listed and agreed with the findings in the report. Ct Abdomen Pelvis W Contrast  Result Date: 09/05/2018 CLINICAL DATA:  Generalized abdominal pain. History of lung cancer. EXAM: CT ABDOMEN AND PELVIS WITH CONTRAST TECHNIQUE: Multidetector CT imaging of the abdomen and pelvis was performed using the standard protocol following bolus administration of intravenous contrast. CONTRAST:  162m OMNIPAQUE IOHEXOL 300 MG/ML  SOLN COMPARISON:  Pelvic radiography same day FINDINGS: Lower chest: Right pleural effusion or pleural thickening. Small nodules along the right diaphragmatic surface worrisome in this clinical setting for metastatic disease. Hepatobiliary: No liver parenchymal lesion is seen. No calcified gallstones. Small amount intraperitoneal fluid along the margin of the liver. Pancreas: Normal  Spleen: Normal. Insignificant calcified granulomas. Adrenals/Urinary Tract: Adrenal glands are normal. Kidneys are normal. Bladder is normal. Stomach/Bowel: No primary bowel pathology is evident. Vascular/Lymphatic: Aortic atherosclerosis. No aneurysm. IVC is normal. No retroperitoneal lymphadenopathy. Reproductive: Previous hysterectomy. Cystic abnormality of the left ovary measuring up to 5 cm in diameter. This could be a benign or malignant cystic mass. Other: Multiple nodules along the peritoneal surface throughout the abdomen including in the pelvic cul de sac region and extensively within the omentum. This consistent with widely metastatic carcinoma. Musculoskeletal: Compression fractures at T10 and L2, similar to the study of 06/20/2018. These have not progressed but may not be completely healed. They could be benign or pathologic fractures. IMPRESSION: 1. Widespread metastatic disease throughout the peritoneal surface throughout the abdomen and pelvis including in the pelvic cul de sac region and extensively within the omentum. 2. 5 cm cystic abnormality of the left ovary. This could be a benign or malignant cystic mass. 3. Right pleural effusion or pleural thickening. Small nodules along the right diaphragmatic surface worrisome for metastatic disease. 4. Compression fractures at T10 and L2, similar to the study of 06/20/2018. These could be benign or pathologic fractures. 5. As far as the origin the metastatic disease, differential considerations include the patient's known lung cancer, the left ovarian lesion and occult gastrointestinal malignancy. Electronically Signed   By: MNelson ChimesM.D.   On: 09/05/2018 12:44   Dg Pelvis Portable  Result Date: 09/05/2018 CLINICAL DATA:  Bilateral hip pain post fall. EXAM: PORTABLE PELVIS 1-2 VIEWS COMPARISON:  None. FINDINGS: There is no evidence of pelvic fracture or diastasis. No pelvic bone lesions are seen. IMPRESSION: Negative. Electronically Signed   By:  DFidela SalisburyM.D.   On: 09/05/2018 10:23    All questions were answered. The patient knows to call the clinic with any problems, questions or concerns. No barriers to learning was detected.  I spent 70 minutes counseling the patient face to face. The total time spent in the appointment was 80 minutes and more than 50% was on counseling and review of test results  NHeath Lark MD 09/16/2018 1:23 PM

## 2018-09-16 NOTE — Telephone Encounter (Signed)
No los °

## 2018-09-16 NOTE — Assessment & Plan Note (Signed)
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

## 2018-09-18 DIAGNOSIS — H409 Unspecified glaucoma: Secondary | ICD-10-CM | POA: Diagnosis not present

## 2018-09-18 DIAGNOSIS — S22000A Wedge compression fracture of unspecified thoracic vertebra, initial encounter for closed fracture: Secondary | ICD-10-CM | POA: Diagnosis not present

## 2018-09-18 DIAGNOSIS — J4520 Mild intermittent asthma, uncomplicated: Secondary | ICD-10-CM | POA: Diagnosis not present

## 2018-09-18 DIAGNOSIS — C801 Malignant (primary) neoplasm, unspecified: Secondary | ICD-10-CM | POA: Diagnosis not present

## 2018-09-18 DIAGNOSIS — I10 Essential (primary) hypertension: Secondary | ICD-10-CM | POA: Diagnosis not present

## 2018-09-18 DIAGNOSIS — S32000A Wedge compression fracture of unspecified lumbar vertebra, initial encounter for closed fracture: Secondary | ICD-10-CM | POA: Diagnosis not present

## 2018-09-18 DIAGNOSIS — Z85118 Personal history of other malignant neoplasm of bronchus and lung: Secondary | ICD-10-CM | POA: Diagnosis not present

## 2018-09-18 DIAGNOSIS — J918 Pleural effusion in other conditions classified elsewhere: Secondary | ICD-10-CM | POA: Diagnosis not present

## 2018-09-18 DIAGNOSIS — M1990 Unspecified osteoarthritis, unspecified site: Secondary | ICD-10-CM | POA: Diagnosis not present

## 2018-09-18 DIAGNOSIS — Z8619 Personal history of other infectious and parasitic diseases: Secondary | ICD-10-CM | POA: Diagnosis not present

## 2018-09-18 DIAGNOSIS — E785 Hyperlipidemia, unspecified: Secondary | ICD-10-CM | POA: Diagnosis not present

## 2018-09-18 DIAGNOSIS — I4891 Unspecified atrial fibrillation: Secondary | ICD-10-CM | POA: Diagnosis not present

## 2018-09-18 DIAGNOSIS — J701 Chronic and other pulmonary manifestations due to radiation: Secondary | ICD-10-CM | POA: Diagnosis not present

## 2018-09-18 DIAGNOSIS — N83202 Unspecified ovarian cyst, left side: Secondary | ICD-10-CM | POA: Diagnosis not present

## 2018-09-18 DIAGNOSIS — H548 Legal blindness, as defined in USA: Secondary | ICD-10-CM | POA: Diagnosis not present

## 2018-09-19 ENCOUNTER — Telehealth: Payer: Self-pay

## 2018-09-19 DIAGNOSIS — I4891 Unspecified atrial fibrillation: Secondary | ICD-10-CM | POA: Diagnosis not present

## 2018-09-19 DIAGNOSIS — C801 Malignant (primary) neoplasm, unspecified: Secondary | ICD-10-CM | POA: Diagnosis not present

## 2018-09-19 DIAGNOSIS — Z85118 Personal history of other malignant neoplasm of bronchus and lung: Secondary | ICD-10-CM | POA: Diagnosis not present

## 2018-09-19 DIAGNOSIS — N83202 Unspecified ovarian cyst, left side: Secondary | ICD-10-CM | POA: Diagnosis not present

## 2018-09-19 DIAGNOSIS — J918 Pleural effusion in other conditions classified elsewhere: Secondary | ICD-10-CM | POA: Diagnosis not present

## 2018-09-19 DIAGNOSIS — J701 Chronic and other pulmonary manifestations due to radiation: Secondary | ICD-10-CM | POA: Diagnosis not present

## 2018-09-19 NOTE — Telephone Encounter (Signed)
There is no limit how long she can stay on dexamethasone They can consider adding a second dose at lunch time to see if she would feel better Hospice can monitor

## 2018-09-19 NOTE — Telephone Encounter (Signed)
Called with below message. Husband verbalized understanding.

## 2018-09-19 NOTE — Telephone Encounter (Signed)
-----   Message from Heath Lark, MD sent at 09/19/2018  8:25 AM EST ----- Regarding: call her husband Glendell Docker or daughter Arbie Cookey Can you call them and ask how she is doing?

## 2018-09-19 NOTE — Telephone Encounter (Signed)
Called regarding below message. On speaker with husband and daughter. They wanted to thank Dr. Alvy Bimler for kindness and the call. Hospice is in the home now. They are on the 3rd day of taking 1 mg Dexamethasone. Slight improvement in appetite. Still complaining of generalized weakness. She is going out today for a hair appt.  How long should they continue the Dexamethasone? Should they increase the dose? What are your thoughts?  Per family.

## 2018-09-24 DIAGNOSIS — C801 Malignant (primary) neoplasm, unspecified: Secondary | ICD-10-CM | POA: Diagnosis not present

## 2018-09-24 DIAGNOSIS — I4891 Unspecified atrial fibrillation: Secondary | ICD-10-CM | POA: Diagnosis not present

## 2018-09-24 DIAGNOSIS — Z85118 Personal history of other malignant neoplasm of bronchus and lung: Secondary | ICD-10-CM | POA: Diagnosis not present

## 2018-09-24 DIAGNOSIS — J918 Pleural effusion in other conditions classified elsewhere: Secondary | ICD-10-CM | POA: Diagnosis not present

## 2018-09-24 DIAGNOSIS — N83202 Unspecified ovarian cyst, left side: Secondary | ICD-10-CM | POA: Diagnosis not present

## 2018-09-24 DIAGNOSIS — J701 Chronic and other pulmonary manifestations due to radiation: Secondary | ICD-10-CM | POA: Diagnosis not present

## 2018-09-26 ENCOUNTER — Telehealth: Payer: Self-pay

## 2018-09-26 NOTE — Telephone Encounter (Signed)
Husband called and left a message. He is giving her 1000 mg tylenol every 6 hours for pain. Does Dr.Gorsuch think this is too much tylenol? This is controlling the pain. She is taking Dexamethasone 1 mg daily. Should they give more of the Dexamethasone? Go up to 2 mg?  Called back per Dr. Alvy Bimler. Continue Tylenol as above and Dexamethasone can increase to 1 1/2 mg in am or 1 mg in am and 1/2 tablet at lunch, so this does not interfere with sleep. Given message. Husband verbalized understanding.

## 2018-09-28 DIAGNOSIS — J918 Pleural effusion in other conditions classified elsewhere: Secondary | ICD-10-CM | POA: Diagnosis not present

## 2018-09-28 DIAGNOSIS — Z8619 Personal history of other infectious and parasitic diseases: Secondary | ICD-10-CM | POA: Diagnosis not present

## 2018-09-28 DIAGNOSIS — J701 Chronic and other pulmonary manifestations due to radiation: Secondary | ICD-10-CM | POA: Diagnosis not present

## 2018-09-28 DIAGNOSIS — H409 Unspecified glaucoma: Secondary | ICD-10-CM | POA: Diagnosis not present

## 2018-09-28 DIAGNOSIS — H548 Legal blindness, as defined in USA: Secondary | ICD-10-CM | POA: Diagnosis not present

## 2018-09-28 DIAGNOSIS — S32000A Wedge compression fracture of unspecified lumbar vertebra, initial encounter for closed fracture: Secondary | ICD-10-CM | POA: Diagnosis not present

## 2018-09-28 DIAGNOSIS — Z85118 Personal history of other malignant neoplasm of bronchus and lung: Secondary | ICD-10-CM | POA: Diagnosis not present

## 2018-09-28 DIAGNOSIS — S22000A Wedge compression fracture of unspecified thoracic vertebra, initial encounter for closed fracture: Secondary | ICD-10-CM | POA: Diagnosis not present

## 2018-09-28 DIAGNOSIS — N83202 Unspecified ovarian cyst, left side: Secondary | ICD-10-CM | POA: Diagnosis not present

## 2018-09-28 DIAGNOSIS — I4891 Unspecified atrial fibrillation: Secondary | ICD-10-CM | POA: Diagnosis not present

## 2018-09-28 DIAGNOSIS — J4520 Mild intermittent asthma, uncomplicated: Secondary | ICD-10-CM | POA: Diagnosis not present

## 2018-09-28 DIAGNOSIS — C801 Malignant (primary) neoplasm, unspecified: Secondary | ICD-10-CM | POA: Diagnosis not present

## 2018-09-28 DIAGNOSIS — E785 Hyperlipidemia, unspecified: Secondary | ICD-10-CM | POA: Diagnosis not present

## 2018-09-28 DIAGNOSIS — M1990 Unspecified osteoarthritis, unspecified site: Secondary | ICD-10-CM | POA: Diagnosis not present

## 2018-09-28 DIAGNOSIS — I10 Essential (primary) hypertension: Secondary | ICD-10-CM | POA: Diagnosis not present

## 2018-10-01 DIAGNOSIS — C801 Malignant (primary) neoplasm, unspecified: Secondary | ICD-10-CM | POA: Diagnosis not present

## 2018-10-01 DIAGNOSIS — N83202 Unspecified ovarian cyst, left side: Secondary | ICD-10-CM | POA: Diagnosis not present

## 2018-10-01 DIAGNOSIS — J918 Pleural effusion in other conditions classified elsewhere: Secondary | ICD-10-CM | POA: Diagnosis not present

## 2018-10-01 DIAGNOSIS — Z85118 Personal history of other malignant neoplasm of bronchus and lung: Secondary | ICD-10-CM | POA: Diagnosis not present

## 2018-10-01 DIAGNOSIS — I4891 Unspecified atrial fibrillation: Secondary | ICD-10-CM | POA: Diagnosis not present

## 2018-10-01 DIAGNOSIS — J701 Chronic and other pulmonary manifestations due to radiation: Secondary | ICD-10-CM | POA: Diagnosis not present

## 2018-10-04 DIAGNOSIS — N83202 Unspecified ovarian cyst, left side: Secondary | ICD-10-CM | POA: Diagnosis not present

## 2018-10-04 DIAGNOSIS — I4891 Unspecified atrial fibrillation: Secondary | ICD-10-CM | POA: Diagnosis not present

## 2018-10-04 DIAGNOSIS — C801 Malignant (primary) neoplasm, unspecified: Secondary | ICD-10-CM | POA: Diagnosis not present

## 2018-10-04 DIAGNOSIS — J918 Pleural effusion in other conditions classified elsewhere: Secondary | ICD-10-CM | POA: Diagnosis not present

## 2018-10-04 DIAGNOSIS — Z85118 Personal history of other malignant neoplasm of bronchus and lung: Secondary | ICD-10-CM | POA: Diagnosis not present

## 2018-10-04 DIAGNOSIS — J701 Chronic and other pulmonary manifestations due to radiation: Secondary | ICD-10-CM | POA: Diagnosis not present

## 2018-10-09 DIAGNOSIS — N83202 Unspecified ovarian cyst, left side: Secondary | ICD-10-CM | POA: Diagnosis not present

## 2018-10-09 DIAGNOSIS — J701 Chronic and other pulmonary manifestations due to radiation: Secondary | ICD-10-CM | POA: Diagnosis not present

## 2018-10-09 DIAGNOSIS — Z85118 Personal history of other malignant neoplasm of bronchus and lung: Secondary | ICD-10-CM | POA: Diagnosis not present

## 2018-10-09 DIAGNOSIS — C801 Malignant (primary) neoplasm, unspecified: Secondary | ICD-10-CM | POA: Diagnosis not present

## 2018-10-09 DIAGNOSIS — J918 Pleural effusion in other conditions classified elsewhere: Secondary | ICD-10-CM | POA: Diagnosis not present

## 2018-10-09 DIAGNOSIS — I4891 Unspecified atrial fibrillation: Secondary | ICD-10-CM | POA: Diagnosis not present

## 2018-10-11 DIAGNOSIS — C801 Malignant (primary) neoplasm, unspecified: Secondary | ICD-10-CM | POA: Diagnosis not present

## 2018-10-11 DIAGNOSIS — J701 Chronic and other pulmonary manifestations due to radiation: Secondary | ICD-10-CM | POA: Diagnosis not present

## 2018-10-11 DIAGNOSIS — I4891 Unspecified atrial fibrillation: Secondary | ICD-10-CM | POA: Diagnosis not present

## 2018-10-11 DIAGNOSIS — Z85118 Personal history of other malignant neoplasm of bronchus and lung: Secondary | ICD-10-CM | POA: Diagnosis not present

## 2018-10-11 DIAGNOSIS — J918 Pleural effusion in other conditions classified elsewhere: Secondary | ICD-10-CM | POA: Diagnosis not present

## 2018-10-11 DIAGNOSIS — N83202 Unspecified ovarian cyst, left side: Secondary | ICD-10-CM | POA: Diagnosis not present

## 2018-10-15 DIAGNOSIS — J701 Chronic and other pulmonary manifestations due to radiation: Secondary | ICD-10-CM | POA: Diagnosis not present

## 2018-10-15 DIAGNOSIS — J918 Pleural effusion in other conditions classified elsewhere: Secondary | ICD-10-CM | POA: Diagnosis not present

## 2018-10-15 DIAGNOSIS — Z85118 Personal history of other malignant neoplasm of bronchus and lung: Secondary | ICD-10-CM | POA: Diagnosis not present

## 2018-10-15 DIAGNOSIS — C801 Malignant (primary) neoplasm, unspecified: Secondary | ICD-10-CM | POA: Diagnosis not present

## 2018-10-15 DIAGNOSIS — I4891 Unspecified atrial fibrillation: Secondary | ICD-10-CM | POA: Diagnosis not present

## 2018-10-15 DIAGNOSIS — N83202 Unspecified ovarian cyst, left side: Secondary | ICD-10-CM | POA: Diagnosis not present

## 2018-10-19 DIAGNOSIS — J701 Chronic and other pulmonary manifestations due to radiation: Secondary | ICD-10-CM | POA: Diagnosis not present

## 2018-10-19 DIAGNOSIS — C801 Malignant (primary) neoplasm, unspecified: Secondary | ICD-10-CM | POA: Diagnosis not present

## 2018-10-19 DIAGNOSIS — Z85118 Personal history of other malignant neoplasm of bronchus and lung: Secondary | ICD-10-CM | POA: Diagnosis not present

## 2018-10-19 DIAGNOSIS — N83202 Unspecified ovarian cyst, left side: Secondary | ICD-10-CM | POA: Diagnosis not present

## 2018-10-19 DIAGNOSIS — I4891 Unspecified atrial fibrillation: Secondary | ICD-10-CM | POA: Diagnosis not present

## 2018-10-19 DIAGNOSIS — J918 Pleural effusion in other conditions classified elsewhere: Secondary | ICD-10-CM | POA: Diagnosis not present

## 2018-10-21 DIAGNOSIS — C801 Malignant (primary) neoplasm, unspecified: Secondary | ICD-10-CM | POA: Diagnosis not present

## 2018-10-21 DIAGNOSIS — I4891 Unspecified atrial fibrillation: Secondary | ICD-10-CM | POA: Diagnosis not present

## 2018-10-21 DIAGNOSIS — Z85118 Personal history of other malignant neoplasm of bronchus and lung: Secondary | ICD-10-CM | POA: Diagnosis not present

## 2018-10-21 DIAGNOSIS — N83202 Unspecified ovarian cyst, left side: Secondary | ICD-10-CM | POA: Diagnosis not present

## 2018-10-21 DIAGNOSIS — J701 Chronic and other pulmonary manifestations due to radiation: Secondary | ICD-10-CM | POA: Diagnosis not present

## 2018-10-21 DIAGNOSIS — J918 Pleural effusion in other conditions classified elsewhere: Secondary | ICD-10-CM | POA: Diagnosis not present

## 2018-10-22 DIAGNOSIS — Z85118 Personal history of other malignant neoplasm of bronchus and lung: Secondary | ICD-10-CM | POA: Diagnosis not present

## 2018-10-22 DIAGNOSIS — J918 Pleural effusion in other conditions classified elsewhere: Secondary | ICD-10-CM | POA: Diagnosis not present

## 2018-10-22 DIAGNOSIS — N83202 Unspecified ovarian cyst, left side: Secondary | ICD-10-CM | POA: Diagnosis not present

## 2018-10-22 DIAGNOSIS — J701 Chronic and other pulmonary manifestations due to radiation: Secondary | ICD-10-CM | POA: Diagnosis not present

## 2018-10-22 DIAGNOSIS — I4891 Unspecified atrial fibrillation: Secondary | ICD-10-CM | POA: Diagnosis not present

## 2018-10-22 DIAGNOSIS — C801 Malignant (primary) neoplasm, unspecified: Secondary | ICD-10-CM | POA: Diagnosis not present

## 2018-10-23 ENCOUNTER — Telehealth: Payer: Self-pay | Admitting: Cardiology

## 2018-10-23 ENCOUNTER — Other Ambulatory Visit (HOSPITAL_COMMUNITY): Payer: Self-pay | Admitting: Cardiology

## 2018-10-23 DIAGNOSIS — I4891 Unspecified atrial fibrillation: Secondary | ICD-10-CM | POA: Diagnosis not present

## 2018-10-23 DIAGNOSIS — J701 Chronic and other pulmonary manifestations due to radiation: Secondary | ICD-10-CM | POA: Diagnosis not present

## 2018-10-23 DIAGNOSIS — Z85118 Personal history of other malignant neoplasm of bronchus and lung: Secondary | ICD-10-CM | POA: Diagnosis not present

## 2018-10-23 DIAGNOSIS — J918 Pleural effusion in other conditions classified elsewhere: Secondary | ICD-10-CM | POA: Diagnosis not present

## 2018-10-23 DIAGNOSIS — N83202 Unspecified ovarian cyst, left side: Secondary | ICD-10-CM | POA: Diagnosis not present

## 2018-10-23 DIAGNOSIS — C801 Malignant (primary) neoplasm, unspecified: Secondary | ICD-10-CM | POA: Diagnosis not present

## 2018-10-23 NOTE — Telephone Encounter (Signed)
New Message         Patient's wife is calling to talk with Dr. Curt Bears only. (Spoke with Sherri already)

## 2018-10-23 NOTE — Telephone Encounter (Signed)
Dr. Curt Bears spoke to husband who called in to report pt having more Afib. Camnitz advised to have pt increase Amiodarone to 200 mg daily and call office if she does not go into NSR in the next few days.  If pt/husband calls back and pt still having AFib issues then we will schedule pt to be seen in AFib clinic.

## 2018-10-24 DIAGNOSIS — Z85118 Personal history of other malignant neoplasm of bronchus and lung: Secondary | ICD-10-CM | POA: Diagnosis not present

## 2018-10-24 DIAGNOSIS — C801 Malignant (primary) neoplasm, unspecified: Secondary | ICD-10-CM | POA: Diagnosis not present

## 2018-10-24 DIAGNOSIS — N83202 Unspecified ovarian cyst, left side: Secondary | ICD-10-CM | POA: Diagnosis not present

## 2018-10-24 DIAGNOSIS — J918 Pleural effusion in other conditions classified elsewhere: Secondary | ICD-10-CM | POA: Diagnosis not present

## 2018-10-24 DIAGNOSIS — I4891 Unspecified atrial fibrillation: Secondary | ICD-10-CM | POA: Diagnosis not present

## 2018-10-24 DIAGNOSIS — J701 Chronic and other pulmonary manifestations due to radiation: Secondary | ICD-10-CM | POA: Diagnosis not present

## 2018-10-25 DIAGNOSIS — J701 Chronic and other pulmonary manifestations due to radiation: Secondary | ICD-10-CM | POA: Diagnosis not present

## 2018-10-25 DIAGNOSIS — J918 Pleural effusion in other conditions classified elsewhere: Secondary | ICD-10-CM | POA: Diagnosis not present

## 2018-10-25 DIAGNOSIS — I4891 Unspecified atrial fibrillation: Secondary | ICD-10-CM | POA: Diagnosis not present

## 2018-10-25 DIAGNOSIS — Z85118 Personal history of other malignant neoplasm of bronchus and lung: Secondary | ICD-10-CM | POA: Diagnosis not present

## 2018-10-25 DIAGNOSIS — C801 Malignant (primary) neoplasm, unspecified: Secondary | ICD-10-CM | POA: Diagnosis not present

## 2018-10-25 DIAGNOSIS — N83202 Unspecified ovarian cyst, left side: Secondary | ICD-10-CM | POA: Diagnosis not present

## 2018-10-26 DIAGNOSIS — C801 Malignant (primary) neoplasm, unspecified: Secondary | ICD-10-CM | POA: Diagnosis not present

## 2018-10-26 DIAGNOSIS — N83202 Unspecified ovarian cyst, left side: Secondary | ICD-10-CM | POA: Diagnosis not present

## 2018-10-26 DIAGNOSIS — J701 Chronic and other pulmonary manifestations due to radiation: Secondary | ICD-10-CM | POA: Diagnosis not present

## 2018-10-26 DIAGNOSIS — J918 Pleural effusion in other conditions classified elsewhere: Secondary | ICD-10-CM | POA: Diagnosis not present

## 2018-10-26 DIAGNOSIS — I4891 Unspecified atrial fibrillation: Secondary | ICD-10-CM | POA: Diagnosis not present

## 2018-10-26 DIAGNOSIS — Z85118 Personal history of other malignant neoplasm of bronchus and lung: Secondary | ICD-10-CM | POA: Diagnosis not present

## 2018-10-27 DIAGNOSIS — I4891 Unspecified atrial fibrillation: Secondary | ICD-10-CM | POA: Diagnosis not present

## 2018-10-27 DIAGNOSIS — J4520 Mild intermittent asthma, uncomplicated: Secondary | ICD-10-CM | POA: Diagnosis not present

## 2018-10-27 DIAGNOSIS — C801 Malignant (primary) neoplasm, unspecified: Secondary | ICD-10-CM | POA: Diagnosis not present

## 2018-10-27 DIAGNOSIS — N83202 Unspecified ovarian cyst, left side: Secondary | ICD-10-CM | POA: Diagnosis not present

## 2018-10-27 DIAGNOSIS — S32000A Wedge compression fracture of unspecified lumbar vertebra, initial encounter for closed fracture: Secondary | ICD-10-CM | POA: Diagnosis not present

## 2018-10-27 DIAGNOSIS — E785 Hyperlipidemia, unspecified: Secondary | ICD-10-CM | POA: Diagnosis not present

## 2018-10-27 DIAGNOSIS — M1990 Unspecified osteoarthritis, unspecified site: Secondary | ICD-10-CM | POA: Diagnosis not present

## 2018-10-27 DIAGNOSIS — H548 Legal blindness, as defined in USA: Secondary | ICD-10-CM | POA: Diagnosis not present

## 2018-10-27 DIAGNOSIS — S22000A Wedge compression fracture of unspecified thoracic vertebra, initial encounter for closed fracture: Secondary | ICD-10-CM | POA: Diagnosis not present

## 2018-10-27 DIAGNOSIS — I10 Essential (primary) hypertension: Secondary | ICD-10-CM | POA: Diagnosis not present

## 2018-10-27 DIAGNOSIS — Z8619 Personal history of other infectious and parasitic diseases: Secondary | ICD-10-CM | POA: Diagnosis not present

## 2018-10-27 DIAGNOSIS — J918 Pleural effusion in other conditions classified elsewhere: Secondary | ICD-10-CM | POA: Diagnosis not present

## 2018-10-27 DIAGNOSIS — H409 Unspecified glaucoma: Secondary | ICD-10-CM | POA: Diagnosis not present

## 2018-10-27 DIAGNOSIS — Z85118 Personal history of other malignant neoplasm of bronchus and lung: Secondary | ICD-10-CM | POA: Diagnosis not present

## 2018-10-27 DIAGNOSIS — J701 Chronic and other pulmonary manifestations due to radiation: Secondary | ICD-10-CM | POA: Diagnosis not present

## 2018-10-28 DIAGNOSIS — C801 Malignant (primary) neoplasm, unspecified: Secondary | ICD-10-CM | POA: Diagnosis not present

## 2018-10-28 DIAGNOSIS — Z85118 Personal history of other malignant neoplasm of bronchus and lung: Secondary | ICD-10-CM | POA: Diagnosis not present

## 2018-10-28 DIAGNOSIS — J701 Chronic and other pulmonary manifestations due to radiation: Secondary | ICD-10-CM | POA: Diagnosis not present

## 2018-10-28 DIAGNOSIS — J918 Pleural effusion in other conditions classified elsewhere: Secondary | ICD-10-CM | POA: Diagnosis not present

## 2018-10-28 DIAGNOSIS — I4891 Unspecified atrial fibrillation: Secondary | ICD-10-CM | POA: Diagnosis not present

## 2018-10-28 DIAGNOSIS — N83202 Unspecified ovarian cyst, left side: Secondary | ICD-10-CM | POA: Diagnosis not present

## 2018-10-29 DIAGNOSIS — C801 Malignant (primary) neoplasm, unspecified: Secondary | ICD-10-CM | POA: Diagnosis not present

## 2018-10-29 DIAGNOSIS — J701 Chronic and other pulmonary manifestations due to radiation: Secondary | ICD-10-CM | POA: Diagnosis not present

## 2018-10-29 DIAGNOSIS — I4891 Unspecified atrial fibrillation: Secondary | ICD-10-CM | POA: Diagnosis not present

## 2018-10-29 DIAGNOSIS — Z85118 Personal history of other malignant neoplasm of bronchus and lung: Secondary | ICD-10-CM | POA: Diagnosis not present

## 2018-10-29 DIAGNOSIS — N83202 Unspecified ovarian cyst, left side: Secondary | ICD-10-CM | POA: Diagnosis not present

## 2018-10-29 DIAGNOSIS — J918 Pleural effusion in other conditions classified elsewhere: Secondary | ICD-10-CM | POA: Diagnosis not present

## 2018-10-30 DIAGNOSIS — C801 Malignant (primary) neoplasm, unspecified: Secondary | ICD-10-CM | POA: Diagnosis not present

## 2018-10-30 DIAGNOSIS — Z85118 Personal history of other malignant neoplasm of bronchus and lung: Secondary | ICD-10-CM | POA: Diagnosis not present

## 2018-10-30 DIAGNOSIS — J701 Chronic and other pulmonary manifestations due to radiation: Secondary | ICD-10-CM | POA: Diagnosis not present

## 2018-10-30 DIAGNOSIS — I4891 Unspecified atrial fibrillation: Secondary | ICD-10-CM | POA: Diagnosis not present

## 2018-10-30 DIAGNOSIS — J918 Pleural effusion in other conditions classified elsewhere: Secondary | ICD-10-CM | POA: Diagnosis not present

## 2018-10-30 DIAGNOSIS — N83202 Unspecified ovarian cyst, left side: Secondary | ICD-10-CM | POA: Diagnosis not present

## 2018-10-31 ENCOUNTER — Ambulatory Visit: Payer: Medicare Other | Admitting: Internal Medicine

## 2018-11-27 DEATH — deceased

## 2018-12-25 ENCOUNTER — Telehealth: Payer: Self-pay

## 2018-12-25 NOTE — Telephone Encounter (Signed)
[name] contacting Dr. Clabe Seal office to convey that Mrs. Patchen had passed away on 11/29/2018. This RN expressed sympathies. This RN to have appt tomorrow canceled. Will convey message to Dr. Sondra Come. Loma Sousa, RN BSN

## 2018-12-26 ENCOUNTER — Ambulatory Visit: Payer: PRIVATE HEALTH INSURANCE | Admitting: Radiation Oncology
